# Patient Record
Sex: Male | Born: 1935 | Race: White | Hispanic: No | Smoking: Never smoker
Health system: Southern US, Community
[De-identification: ages and names within clinical notes are randomized; demographics above are authoritative.]

## PROBLEM LIST (undated history)

## (undated) DIAGNOSIS — I10 Essential (primary) hypertension: Secondary | ICD-10-CM

## (undated) DIAGNOSIS — E785 Hyperlipidemia, unspecified: Secondary | ICD-10-CM

## (undated) DIAGNOSIS — I441 Atrioventricular block, second degree: Secondary | ICD-10-CM

## (undated) DIAGNOSIS — R7303 Prediabetes: Secondary | ICD-10-CM

## (undated) DIAGNOSIS — I350 Nonrheumatic aortic (valve) stenosis: Secondary | ICD-10-CM

## (undated) HISTORY — PX: HERNIA REPAIR: SHX51

## (undated) HISTORY — PX: APPENDECTOMY: SHX54

## (undated) HISTORY — DX: Hyperlipidemia, unspecified: E78.5

## (undated) HISTORY — DX: Prediabetes: R73.03

## (undated) HISTORY — DX: Nonrheumatic aortic (valve) stenosis: I35.0

## (undated) HISTORY — PX: TONSILLECTOMY: SUR1361

## (undated) HISTORY — DX: Atrioventricular block, second degree: I44.1

---

## 2004-02-23 ENCOUNTER — Ambulatory Visit: Payer: Self-pay | Admitting: Ophthalmology

## 2004-02-28 ENCOUNTER — Ambulatory Visit: Payer: Self-pay | Admitting: Ophthalmology

## 2004-06-12 ENCOUNTER — Emergency Department: Payer: Self-pay | Admitting: General Practice

## 2004-06-25 ENCOUNTER — Emergency Department: Payer: Self-pay | Admitting: Emergency Medicine

## 2006-10-13 ENCOUNTER — Ambulatory Visit: Payer: Self-pay | Admitting: Internal Medicine

## 2006-11-04 ENCOUNTER — Other Ambulatory Visit: Payer: Self-pay

## 2006-11-04 ENCOUNTER — Ambulatory Visit: Payer: Self-pay | Admitting: General Surgery

## 2006-11-10 ENCOUNTER — Ambulatory Visit: Payer: Self-pay | Admitting: General Surgery

## 2007-05-18 ENCOUNTER — Ambulatory Visit: Payer: Self-pay | Admitting: Unknown Physician Specialty

## 2009-06-22 ENCOUNTER — Ambulatory Visit: Payer: Self-pay | Admitting: Family Medicine

## 2009-10-31 ENCOUNTER — Ambulatory Visit: Payer: Self-pay | Admitting: Ophthalmology

## 2009-11-06 ENCOUNTER — Ambulatory Visit: Payer: Self-pay | Admitting: Ophthalmology

## 2009-11-20 LAB — PATHOLOGY REPORT

## 2009-11-21 ENCOUNTER — Ambulatory Visit: Payer: Self-pay | Admitting: Internal Medicine

## 2009-11-22 ENCOUNTER — Ambulatory Visit: Payer: Self-pay | Admitting: Internal Medicine

## 2009-12-19 ENCOUNTER — Ambulatory Visit: Payer: Self-pay | Admitting: Internal Medicine

## 2010-01-18 ENCOUNTER — Ambulatory Visit: Payer: Self-pay | Admitting: Internal Medicine

## 2010-02-18 ENCOUNTER — Ambulatory Visit: Payer: Self-pay | Admitting: Internal Medicine

## 2010-06-15 ENCOUNTER — Ambulatory Visit: Payer: Self-pay | Admitting: Radiation Oncology

## 2010-06-19 ENCOUNTER — Ambulatory Visit: Payer: Self-pay | Admitting: Radiation Oncology

## 2011-06-14 ENCOUNTER — Ambulatory Visit: Payer: Self-pay | Admitting: Radiation Oncology

## 2011-06-19 ENCOUNTER — Ambulatory Visit: Payer: Self-pay | Admitting: Radiation Oncology

## 2011-10-22 ENCOUNTER — Ambulatory Visit: Payer: Self-pay | Admitting: Urology

## 2011-10-22 LAB — BASIC METABOLIC PANEL
Anion Gap: 5 — ABNORMAL LOW (ref 7–16)
BUN: 22 mg/dL — ABNORMAL HIGH (ref 7–18)
Calcium, Total: 8.6 mg/dL (ref 8.5–10.1)
Chloride: 105 mmol/L (ref 98–107)
Co2: 30 mmol/L (ref 21–32)
Creatinine: 1.3 mg/dL (ref 0.60–1.30)
EGFR (African American): >60
EGFR (Non-African Amer.): 53 — ABNORMAL LOW
Glucose: 127 mg/dL — ABNORMAL HIGH (ref 65–99)
Osmolality: 284 (ref 275–301)
Potassium: 3.5 mmol/L (ref 3.5–5.1)
Sodium: 140 mmol/L (ref 136–145)

## 2011-10-22 LAB — HEMOGLOBIN: HGB: 16.8 g/dL (ref 13.0–18.0)

## 2011-10-30 ENCOUNTER — Ambulatory Visit: Payer: Self-pay | Admitting: Family Medicine

## 2011-11-26 ENCOUNTER — Ambulatory Visit: Payer: Self-pay | Admitting: Urology

## 2011-11-26 LAB — POTASSIUM: Potassium: 3.4 mmol/L — ABNORMAL LOW (ref 3.5–5.1)

## 2011-12-03 ENCOUNTER — Ambulatory Visit: Payer: Self-pay | Admitting: Urology

## 2012-03-04 ENCOUNTER — Ambulatory Visit: Payer: Self-pay | Admitting: Ophthalmology

## 2012-03-04 LAB — POTASSIUM: Potassium: 3.5 mmol/L (ref 3.5–5.1)

## 2012-03-17 ENCOUNTER — Ambulatory Visit: Payer: Self-pay | Admitting: Ophthalmology

## 2012-06-18 ENCOUNTER — Ambulatory Visit: Payer: Self-pay | Admitting: Radiation Oncology

## 2012-07-19 ENCOUNTER — Ambulatory Visit: Payer: Self-pay | Admitting: Radiation Oncology

## 2012-08-17 ENCOUNTER — Ambulatory Visit: Payer: Self-pay | Admitting: Unknown Physician Specialty

## 2012-08-18 LAB — PATHOLOGY REPORT

## 2013-01-12 ENCOUNTER — Ambulatory Visit: Payer: Self-pay | Admitting: Ophthalmology

## 2013-06-18 ENCOUNTER — Ambulatory Visit: Payer: Self-pay | Admitting: Radiation Oncology

## 2014-02-24 ENCOUNTER — Ambulatory Visit: Payer: Self-pay | Admitting: Ophthalmology

## 2014-02-24 LAB — POTASSIUM: Potassium: 3.5 mmol/L (ref 3.5–5.1)

## 2014-03-01 ENCOUNTER — Ambulatory Visit: Payer: Self-pay | Admitting: Ophthalmology

## 2014-06-07 NOTE — Op Note (Signed)
PATIENT NAME:  Timothy Ramirez, Timothy Ramirez MR#:  [id number] DATE OF BIRTH:  12-Feb-1934  DATE OF PROCEDURE:  12/03/2011  PREOPERATIVE DIAGNOSIS: Left inguinal hernia with bladder.   POSTOPERATIVE DIAGNOSIS: Left inguinal hernia with bladder   PROCEDURE: Left inguinal herniorrhaphy.   SURGEON: Madolyn FriezeBrian S. Achilles Dunkope, MD   ANESTHESIA: Laryngeal mask airway anesthesia.   INDICATIONS: The patient is a 81 year old gentleman who initially presented with irritative voiding symptoms. He was also noted to have a left inguinal hernia at the time of cystoscopy. The bladder was noted to be pulled to the left with a significant portion of the bladder into the hernia defect. He presents for left inguinal herniorrhaphy.   PROCEDURE: After informed consent was obtained, the patient was taken to the operating room and placed in the supine position on the operating table under general endotracheal anesthesia. The patient was then prepped and draped in the usual standard fashion. An approximate 6 cm incision was made in the left inguinal crease. The incision was continued down to expose the fascia. A large hernia defect was noted extending through the external ring. The external fascia was opened in the standard fashion. The ilioinguinal nerve was identified. It was mobilized medially. The cord was identified at the inferior aspect of the large hernia defect. A plane was developed between the hernia and the cord contents. This was encircled. It was dissected free to the level of the hernia defect. A large portion of bladder was through a small defect approximately 1.5 to 2 cm in size. Due to thickening of the bladder, ongoing manipulation was necessary to reduce the bladder back into the preperitoneal compartment. This was successfully accomplished. A gauze was placed into the opening to keep the bladder in place while the remainder of the dissection was performed. The transverse layer was identified. A relaxing incision was made. The  shelving edge was also identified. Allis clamps were placed along the shelving edge. The ligament was identified and cleaned to the level of the pubis and lateral. 0 Surgilon sutures were then utilized starting at the most medial aspect of the shelving edge to the ligament at the level of the symphysis. Approximately 18 sutures were utilized to complete the closure. The fifth finger could be easily inserted into the remaining opening for the cord. Good closure was noted throughout. The cord was then returned to the canal. The ilioinguinal nerve was placed on the dorsal aspect of the cord contents. The ring and external fascia was closed utilizing a running 3-0 Vicryl suture. Care was taken to avoid injury to the nerve. The nerve was easily identified throughout the closure. The subcutaneous tissue was then closed utilizing a 0 plain gut suture. The skin was closed utilizing 4-0 Vicryl subcuticular stitch. Steri-Strips, Telfa, and Tegaderm dressing was applied. Approximately 10 mL of half-strength Marcaine was injected into the incision site. The patient was then awakened from general endotracheal anesthesia and was taken to the recovery room in stable condition. There were no problems or complications. The patient tolerated the procedure well. Estimated blood loss was minimal.   ____________________________ Madolyn FriezeBrian S. Achilles Dunkope, MD bsc:drc D: 12/03/2011 10:59:45 ET T: 12/03/2011 11:16:16 ET JOB#: [id number]  cc: Madolyn FriezeBrian S. Achilles Dunkope, MD, <Dictator> Madolyn FriezeBRIAN S Donnae Michels MD ELECTRONICALLY SIGNED 12/03/2011 21:25

## 2014-06-10 NOTE — Op Note (Signed)
PATIENT NAME:  Timothy CooperBOWMAN, Abhishek M MR#:  [id number] DATE OF BIRTH:  07-25-1933  DATE OF PROCEDURE:  03/17/2012  PREOPERATIVE DIAGNOSIS: Visually significant pterygium of the left eye.   POSTOPERATIVE DIAGNOSIS: Visually significant pterygium of the left eye.   OPERATIVE PROCEDURE: Pterygium excision with creation of a conjunctival autograft in the left eye.   SURGEON: Jerilee FieldWilliam L. Lianna Sitzmann, MD.   ANESTHESIA:   4% Xylocaine , 0.75% bupivicaine     COMPLICATIONS: None.   DESCRIPTION OF PROCEDURE: The patient was examined and consented for this procedure in the preoperative holding area. He was then brought back to the operating room where the anesthesia team deployed managed anesthesia care while 5.5 mL of the aforementioned mixture were placed into the left orbit without complication. The eye was then prepped and draped in the usual sterile ophthalmic fashion. A 5-0 Mersilene on a spatulated needle was placed in a partial-thickness fashion through the superior limbus to serve as a stay suture. The eye was rotated laterally.Sharp and blunt dissection with Westcott scissors was used to remove the ptrygium. A beaver blade was used scrape some of the scar tissue from Vignola's layer. The resulting bare sclera defect was measured to be 10 mm x 7   This autograft was harvested in a blunt and sharp dissection using the Westcott scissors. The graft was moved to the bare sclera maintaining its original orientation to the globe. The graft was secured into place using 8 interrupted 9-0 Vicryl sutures. The stay suture was removed. The eye was dressed with maxitrol oinment and protective shield. The patient is instructed to leave these in place until tomorrow morning, at which time he will remove them and begin topical drops. He does have oxycodone at home, which he may take as he has previously for pain should his eye be uncomfortable this evening.     ____________________________ Jerilee FieldWilliam L. Ruhan Borak,  MD wlp:cc D: 03/17/2012 15:57:16 ET T: 03/17/2012 17:08:10 ET JOB#: [id number]  cc: Mikey Maffett L. Amiayah Giebel, MD, <Dictator> Jerilee FieldWILLIAM L Vansh Reckart MD ELECTRONICALLY SIGNED 03/26/2012 14:32

## 2014-06-19 NOTE — Op Note (Signed)
PATIENT NAME:  Timothy Ramirez, Timothy Ramirez MR#:  [id number] DATE OF BIRTH:  07/30/1933  DATE OF PROCEDURE:  03/01/2014  PREOPERATIVE DIAGNOSIS:  Nuclear sclerotic cataract of the left eye.   POSTOPERATIVE DIAGNOSIS:  Nuclear sclerotic cataract of the left eye.   OPERATIVE PROCEDURE:  Cataract extraction by phacoemulsification with implant of intraocular lens to left eye.   SURGEON:  Jerilee FieldWilliam L. Chaunce Winkels, MD   ANESTHESIA:  1. Managed anesthesia care.  2. Topical tetracaine drops followed by 2% Xylocaine jelly applied in the preoperative holding area.   COMPLICATIONS:  None.   TECHNIQUE:  Stop and chop.  DESCRIPTION OF PROCEDURE:  The patient was examined and consented in the preoperative holding area where the aforementioned topical anesthesia was applied to the left eye and then brought back to the Operating Room where the left eye was prepped and draped in the usual sterile ophthalmic fashion and a lid speculum was placed. A paracentesis was created with the side port blade and the anterior chamber was filled with viscoelastic. A near clear corneal incision was performed with the steel keratome. A continuous curvilinear capsulorrhexis was performed with a cystotome followed by the capsulorrhexis forceps. Hydrodissection and hydrodelineation were carried out with BSS on a blunt cannula. The lens was removed in a stop and chop technique and the remaining cortical material was removed with the irrigation-aspiration handpiece. The capsular bag was inflated with viscoelastic and the Tecnis ZCB00, 22.0-diopter lens, serial number [id number], was placed in the capsular bag without complication. The remaining viscoelastic was removed from the eye with the irrigation-aspiration handpiece. The wounds were hydrated. The anterior chamber was flushed with Miostat and the eye was inflated to physiologic pressure. 0.1 mL of cefuroxime concentration 10 mg/mL was placed in the anterior chamber. The wounds were found to be  water tight. The eye was dressed with Vigamox. The patient was given protective glasses to wear throughout the day and a shield with which to sleep tonight. The patient was also given drops with which to begin a drop regimen today and will follow up with me in one day.    ____________________________ Jerilee FieldWilliam L. Melody Savidge, MD wlp:nb D: 03/01/2014 21:06:09 ET T: 03/02/2014 04:00:02 ET JOB#: [id number]  cc: Dorene Bruni L. Joniyah Mallinger, MD, <Dictator> Jerilee FieldWILLIAM L Aviella Disbrow MD ELECTRONICALLY SIGNED 03/02/2014 11:19

## 2014-09-05 ENCOUNTER — Emergency Department: Payer: Medicare Other

## 2014-09-05 ENCOUNTER — Emergency Department
Admission: EM | Admit: 2014-09-05 | Discharge: 2014-09-05 | Disposition: A | Discharge status: Home / Self Care | Payer: Medicare Other | Attending: Emergency Medicine | Admitting: Emergency Medicine

## 2014-09-05 DIAGNOSIS — R109 Unspecified abdominal pain: Secondary | ICD-10-CM | POA: Diagnosis not present

## 2014-09-05 DIAGNOSIS — I10 Essential (primary) hypertension: Secondary | ICD-10-CM | POA: Diagnosis not present

## 2014-09-05 HISTORY — DX: Essential (primary) hypertension: I10

## 2014-09-05 LAB — COMPREHENSIVE METABOLIC PANEL
ALT: 20 U/L (ref 17–63)
AST: 22 U/L (ref 15–41)
Albumin: 3.7 g/dL (ref 3.5–5.0)
Alkaline Phosphatase: 90 U/L (ref 38–126)
Anion gap: 8 (ref 5–15)
BUN: 20 mg/dL (ref 6–20)
CO2: 28 mmol/L (ref 22–32)
Calcium: 8.8 mg/dL — ABNORMAL LOW (ref 8.9–10.3)
Chloride: 100 mmol/L — ABNORMAL LOW (ref 101–111)
Creatinine, Ser: 1.14 mg/dL (ref 0.61–1.24)
GFR calc Af Amer: >60 mL/min (ref >60)
GFR calc non Af Amer: 60 mL/min — ABNORMAL LOW (ref >60)
Glucose, Bld: 122 mg/dL — ABNORMAL HIGH (ref 65–99)
Potassium: 2.9 mmol/L — ABNORMAL LOW (ref 3.5–5.1)
Sodium: 136 mmol/L (ref 135–145)
Total Bilirubin: 1.8 mg/dL — ABNORMAL HIGH (ref 0.3–1.2)
Total Protein: 6.7 g/dL (ref 6.5–8.1)

## 2014-09-05 LAB — CBC WITH DIFFERENTIAL/PLATELET
Basophils Absolute: 0 10*3/uL (ref 0–0.1)
Basophils Relative: 1 %
Eosinophils Absolute: 0.1 10*3/uL (ref 0–0.7)
Eosinophils Relative: 2 %
HCT: 46.2 % (ref 40.0–52.0)
Hemoglobin: 15.9 g/dL (ref 13.0–18.0)
Lymphocytes Relative: 36 %
Lymphs Abs: 1.8 10*3/uL (ref 1.0–3.6)
MCH: 30.8 pg (ref 26.0–34.0)
MCHC: 34.5 g/dL (ref 32.0–36.0)
MCV: 89.3 fL (ref 80.0–100.0)
Monocytes Absolute: 0.6 10*3/uL (ref 0.2–1.0)
Monocytes Relative: 11 %
Neutro Abs: 2.5 10*3/uL (ref 1.4–6.5)
Neutrophils Relative %: 50 %
Platelets: 146 10*3/uL — ABNORMAL LOW (ref 150–440)
RBC: 5.17 MIL/uL (ref 4.40–5.90)
RDW: 13.5 % (ref 11.5–14.5)
WBC: 5.1 10*3/uL (ref 3.8–10.6)

## 2014-09-05 LAB — URINALYSIS COMPLETE WITH MICROSCOPIC (ARMC ONLY)
Bacteria, UA: NONE SEEN
Bilirubin Urine: NEGATIVE
Glucose, UA: NEGATIVE mg/dL
Ketones, ur: NEGATIVE mg/dL
Leukocytes, UA: NEGATIVE
Nitrite: NEGATIVE
Protein, ur: NEGATIVE mg/dL
Specific Gravity, Urine: 1.02 (ref 1.005–1.030)
pH: 6 (ref 5.0–8.0)

## 2014-09-05 LAB — TROPONIN I: Troponin I: <0.03 ng/mL (ref <0.031)

## 2014-09-05 MED ORDER — POTASSIUM CHLORIDE CRYS ER 20 MEQ PO TBCR
40.0000 meq | EXTENDED_RELEASE_TABLET | Freq: Once | ORAL | Status: AC
  Administered 2014-09-05: 40 meq via ORAL
  Filled 2014-09-05: qty 2

## 2014-09-05 MED ORDER — OXYCODONE-ACETAMINOPHEN 5-325 MG PO TABS: 1.0000 | ORAL_TABLET | Freq: Four times a day (QID) | ORAL | Status: DC | PRN

## 2014-09-05 MED ORDER — OXYCODONE-ACETAMINOPHEN 5-325 MG PO TABS
2.0000 | ORAL_TABLET | Freq: Once | ORAL | Status: AC
  Administered 2014-09-05: 2 via ORAL
  Filled 2014-09-05: qty 2

## 2014-09-05 MED ORDER — KETOROLAC TROMETHAMINE 30 MG/ML IJ SOLN
15.0000 mg | Freq: Once | INTRAMUSCULAR | Status: AC
  Administered 2014-09-05: 15 mg via INTRAVENOUS
  Filled 2014-09-05: qty 1

## 2014-09-05 MED ORDER — MORPHINE SULFATE 4 MG/ML IJ SOLN
4.0000 mg | Freq: Once | INTRAMUSCULAR | Status: AC
  Administered 2014-09-05: 4 mg via INTRAVENOUS
  Filled 2014-09-05: qty 1

## 2014-09-05 MED ORDER — POLYETHYLENE GLYCOL 3350 17 G PO PACK: 17.0000 g | PACK | Freq: Every day | ORAL | Status: DC

## 2014-09-05 NOTE — ED Notes (Signed)
MD at bedside. 

## 2014-09-05 NOTE — ED Provider Notes (Signed)
[facility] Emergency Department Provider Note     Time seen: ----------------------------------------- 8:42 AM on 09/05/2014 -----------------------------------------    I have reviewed the triage vital signs and the nursing notes.   HISTORY  Chief Complaint Flank Pain    HPI Timothy Ramirez is a 81 y.o. male who presents ER after left flank pain for last 3-4 days. Describes as a sharp pain, nothing seems to make it better or worse. Patient has not had a history of this before, denies history of renal colic. Denies fevers, chills, nausea, vomiting, or diarrhea.   Past Medical History  Diagnosis Date  . Hypertension     There are no active problems to display for this patient.   Past Surgical History  Procedure Laterality Date  . Tonsillectomy    . Appendectomy      Allergies Review of patient's allergies indicates no known allergies.  Social History History  Substance Use Topics  . Smoking status: Never Smoker   . Smokeless tobacco: Never Used  . Alcohol Use: Yes    Review of Systems Constitutional: Negative for fever. Eyes: Negative for visual changes. ENT: Negative for sore throat. Cardiovascular: Negative for chest pain. Respiratory: Negative for shortness of breath. Gastrointestinal: Positive for left flank pain Genitourinary: Negative for dysuria. Musculoskeletal: Negative for back pain. Skin: Negative for rash. Neurological: Negative for headaches, focal weakness or numbness.  10-point ROS otherwise negative.  ____________________________________________   PHYSICAL EXAM:  VITAL SIGNS: ED Triage Vitals  Enc Vitals Group     BP 09/05/14 0837 158/82 mmHg     Pulse Rate 09/05/14 0837 64     Resp 09/05/14 0837 17     Temp 09/05/14 0837 97.5 F (36.4 C)     Temp Source 09/05/14 0837 Oral     SpO2 09/05/14 0837 99 %     Weight 09/05/14 0837 200 lb (90.719 kg)     Height 09/05/14 0837  (1.702 m)     Head Cir  --      Peak Flow --      Pain Score 09/05/14 0838 7     Pain Loc --      Pain Edu? --      Excl. in GC? --     Constitutional: Alert and oriented. Well appearing and in no distress. Eyes: Conjunctivae are normal. PERRL. Normal extraocular movements. ENT   Head: Normocephalic and atraumatic.   Nose: No congestion/rhinnorhea.   Mouth/Throat: Mucous membranes are moist.   Neck: No stridor. Hematological/Lymphatic/Immunilogical: No cervical lymphadenopathy. Cardiovascular: Normal rate, regular rhythm. Normal and symmetric distal pulses are present in all extremities. No murmurs, rubs, or gallops. Respiratory: Normal respiratory effort without tachypnea nor retractions. Breath sounds are clear and equal bilaterally. No wheezes/rales/rhonchi. Gastrointestinal: Soft No distention. No abdominal bruits. There is no CVA tenderness. Mild left flank tenderness. Musculoskeletal: Nontender with normal range of motion in all extremities. No joint effusions.  No lower extremity tenderness nor edema. Neurologic:  Normal speech and language. No gross focal neurologic deficits are appreciated. Speech is normal. No gait instability. Skin:  Skin is warm, dry and intact. No rash noted. Psychiatric: Mood and affect are normal. Speech and behavior are normal. Patient exhibits appropriate insight and judgment. ____________________________________________  ED COURSE:  Pertinent labs & imaging results that were available during my care of the patient were reviewed by me and considered in my medical decision making (see chart for details). Unclear etiology of left flank pain, could be multifactorial.  We'll need labs and imaging to determine ____________________________________________    LABS (pertinent positives/negatives)  Labs Reviewed  URINALYSIS COMPLETEWITH MICROSCOPIC ([facility] ONLY) - Abnormal; Notable for the following:    Color, Urine YELLOW (*)    APPearance CLEAR (*)    Hgb urine  dipstick 1+ (*)    Squamous Epithelial / LPF 0-5 (*)    All other components within normal limits  CBC WITH DIFFERENTIAL/PLATELET - Abnormal; Notable for the following:    Platelets 146 (*)    All other components within normal limits  COMPREHENSIVE METABOLIC PANEL - Abnormal; Notable for the following:    Potassium 2.9 (*)    Chloride 100 (*)    Glucose, Bld 122 (*)    Calcium 8.8 (*)    Total Bilirubin 1.8 (*)    GFR calc non Af Amer 60 (*)    All other components within normal limits  TROPONIN I    RADIOLOGY Images were viewed by me  Acute abdominal series IMPRESSION: Fairly diffuse stool throughout colon. Bowel gas pattern unremarkable. No lung edema or consolidation.  ____________________________________________  FINAL ASSESSMENT AND PLAN  Left flank pain  Plan: No clear etiology for his symptoms. We discharged with pain medication and muscle relaxants. He is advised to have close follow-up with his doctor if not improving. He was given a dose of oral potassium to replete his potassium level II.9.   Emily FilbertWilliams, Jonathan E, MD   Emily FilbertJonathan E Williams, MD 09/05/14 [phone]

## 2014-09-05 NOTE — Discharge Instructions (Signed)
Flank Pain °Flank pain refers to pain that is located on the side of the body between the upper abdomen and the back. The pain may occur over a short period of time (acute) or may be long-term or reoccurring (chronic). It may be mild or severe. Flank pain can be caused by many things. °CAUSES  °Some of the more common causes of flank pain include: °· Muscle strains.   °· Muscle spasms.   °· A disease of your spine (vertebral disk disease).   °· A lung infection (pneumonia).   °· Fluid around your lungs (pulmonary edema).   °· A kidney infection.   °· Kidney stones.   °· A very painful skin rash caused by the chickenpox virus (shingles).   °· Gallbladder disease.   °HOME CARE INSTRUCTIONS  °Home care will depend on the cause of your pain. In general, °· Rest as directed by your caregiver. °· Drink enough fluids to keep your urine clear or pale yellow. °· Only take over-the-counter or prescription medicines as directed by your caregiver. Some medicines may help relieve the pain. °· Tell your caregiver about any changes in your pain. °· Follow up with your caregiver as directed. °SEEK IMMEDIATE MEDICAL CARE IF:  °· Your pain is not controlled with medicine.   °· You have new or worsening symptoms. °· Your pain increases.   °· You have abdominal pain.   °· You have shortness of breath.   °· You have persistent nausea or vomiting.   °· You have swelling in your abdomen.   °· You feel faint or pass out.   °· You have blood in your urine. °· You have a fever or persistent symptoms for more than 2-3 days. °· You have a fever and your symptoms suddenly get worse. °MAKE SURE YOU:  °· Understand these instructions. °· Will watch your condition. °· Will get help right away if you are not doing well or get worse. °Document Released: 03/28/2005 Document Revised: 10/30/2011 Document Reviewed: 09/19/2011 °[employer]® Patient Information ©2015 [employer]. This information is not intended to replace advice given to you by your  health care provider. Make sure you discuss any questions you have with your health care provider. ° °

## 2014-09-05 NOTE — ED Notes (Signed)
Left flank pain since friday

## 2014-09-05 NOTE — ED Notes (Signed)
Pt c/o left flank pain for the past 3-4 days.Marland Kitchen.denies N/V or other urinary sx.

## 2018-03-23 ENCOUNTER — Emergency Department: Payer: Medicare Other

## 2018-03-23 ENCOUNTER — Encounter: Payer: Self-pay | Admitting: Emergency Medicine

## 2018-03-23 ENCOUNTER — Emergency Department
Admission: EM | Admit: 2018-03-23 | Discharge: 2018-03-23 | Disposition: A | Discharge status: Home / Self Care | Payer: Medicare Other | Attending: Emergency Medicine | Admitting: Emergency Medicine

## 2018-03-23 ENCOUNTER — Other Ambulatory Visit: Payer: Self-pay

## 2018-03-23 DIAGNOSIS — M79602 Pain in left arm: Secondary | ICD-10-CM | POA: Insufficient documentation

## 2018-03-23 DIAGNOSIS — Z79899 Other long term (current) drug therapy: Secondary | ICD-10-CM | POA: Insufficient documentation

## 2018-03-23 DIAGNOSIS — M79603 Pain in arm, unspecified: Secondary | ICD-10-CM

## 2018-03-23 DIAGNOSIS — I10 Essential (primary) hypertension: Secondary | ICD-10-CM | POA: Diagnosis not present

## 2018-03-23 LAB — COMPREHENSIVE METABOLIC PANEL
ALT: 14 U/L (ref 0–44)
AST: 17 U/L (ref 15–41)
Albumin: 3.1 g/dL — ABNORMAL LOW (ref 3.5–5.0)
Alkaline Phosphatase: 79 U/L (ref 38–126)
Anion gap: 5 (ref 5–15)
BUN: 21 mg/dL (ref 8–23)
CO2: 26 mmol/L (ref 22–32)
Calcium: 7.3 mg/dL — ABNORMAL LOW (ref 8.9–10.3)
Chloride: 107 mmol/L (ref 98–111)
Creatinine, Ser: 1.21 mg/dL (ref 0.61–1.24)
GFR calc Af Amer: >60 mL/min (ref >60)
GFR calc non Af Amer: 55 mL/min — ABNORMAL LOW (ref >60)
Glucose, Bld: 157 mg/dL — ABNORMAL HIGH (ref 70–99)
Potassium: 3 mmol/L — ABNORMAL LOW (ref 3.5–5.1)
Sodium: 138 mmol/L (ref 135–145)
Total Bilirubin: 1.5 mg/dL — ABNORMAL HIGH (ref 0.3–1.2)
Total Protein: 5.3 g/dL — ABNORMAL LOW (ref 6.5–8.1)

## 2018-03-23 LAB — CBC WITH DIFFERENTIAL/PLATELET
Abs Immature Granulocytes: 0.02 10*3/uL (ref 0.00–0.07)
Basophils Absolute: 0 10*3/uL (ref 0.0–0.1)
Basophils Relative: 0 %
Eosinophils Absolute: 0.1 10*3/uL (ref 0.0–0.5)
Eosinophils Relative: 1 %
HCT: 45.7 % (ref 39.0–52.0)
Hemoglobin: 15.2 g/dL (ref 13.0–17.0)
Immature Granulocytes: 0 %
Lymphocytes Relative: 26 %
Lymphs Abs: 1.9 10*3/uL (ref 0.7–4.0)
MCH: 30.2 pg (ref 26.0–34.0)
MCHC: 33.3 g/dL (ref 30.0–36.0)
MCV: 90.7 fL (ref 80.0–100.0)
Monocytes Absolute: 0.7 10*3/uL (ref 0.1–1.0)
Monocytes Relative: 10 %
Neutro Abs: 4.5 10*3/uL (ref 1.7–7.7)
Neutrophils Relative %: 63 %
Platelets: 121 10*3/uL — ABNORMAL LOW (ref 150–400)
RBC: 5.04 MIL/uL (ref 4.22–5.81)
RDW: 13.6 % (ref 11.5–15.5)
WBC: 7.3 10*3/uL (ref 4.0–10.5)
nRBC: 0 % (ref 0.0–0.2)

## 2018-03-23 LAB — TROPONIN I
Troponin I: <0.03 ng/mL (ref <0.03)
Troponin I: <0.03 ng/mL (ref <0.03)

## 2018-03-23 NOTE — ED Notes (Signed)
MD at bedside at this time to update pt.

## 2018-03-23 NOTE — ED Triage Notes (Signed)
Pt ems from home for left arm, left shoulder blade pain that started approx. 1600 today. Pain described as a "catch" and now is a burning, has been constant since it started, has decreased some last few minutes. Pt took 325 mg aspirin at home and EMS gave .4 SL nitro x 2 with some relief. Pt denies heart history.

## 2018-03-23 NOTE — ED Provider Notes (Addendum)
[facility] Provider Note  ____________________________________________   I have reviewed the triage vital signs and the nursing notes. Where available I have reviewed prior notes and, if possible and indicated, outside hospital notes.    HISTORY  Chief Complaint Chest Pain    HPI Timothy Ramirez is a 85 y.o. male  Presents today complaining of left arm pain.  States it is kind of a burning discomfort.  He has no chest pain, it, goes towards his shoulder blade a little bit.  He states this is the fourth or fifth time it has happened.  It always happens at rest.  Has had no exertional symptoms, this happens at rest usually.  He was sitting there doing nothing he states when it started.  He has no nausea no vomiting, he denies any orthopnea or leg swelling, he states the pain is nearly gone.  It always goes away on its own.  Patient does have a history of hypertension.  Not had a recent scan of his heart since a myocardial scan under nuclear medicine in 2013 which was normal patient does have a history of hip bursitis which is not acting up at this time.  He does not feel that he injured his arm although he does have an abrasion to the left elbow that he thinks he bumped it on at some point.  Looks fairly fresh.     Past Medical History:  Diagnosis Date  . Hypertension     There are no active problems to display for this patient.   Past Surgical History:  Procedure Laterality Date  . APPENDECTOMY    . HERNIA REPAIR    . TONSILLECTOMY      Prior to Admission medications   Medication Sig Start Date End Date Taking? Authorizing Provider  atorvastatin (LIPITOR) 20 MG tablet Take 20 mg by mouth every morning.    [provider]  citalopram (CELEXA) 20 MG tablet Take 20 mg by mouth daily.    [provider]  hydrochlorothiazide (HYDRODIURIL) 25 MG tablet Take 25 mg by mouth daily.    [provider]   oxyCODONE-acetaminophen (ROXICET) 5-325 MG per tablet Take 1 tablet by mouth every 6 (six) hours as needed. 09/05/14   Emily Filbert, MD  polyethylene glycol (MIRALAX / Ethelene Hal) packet Take 17 g by mouth daily. 09/05/14   Emily Filbert, MD  potassium chloride (KLOR-CON 10) 10 MEQ tablet Take 10 mEq by mouth daily.    [provider]  zolpidem (AMBIEN) 10 MG tablet Take 10 mg by mouth at bedtime.    [provider]    Allergies Patient has no known allergies.  No family history on file.  Social History Social History   Tobacco Use  . Smoking status: Never Smoker  . Smokeless tobacco: Never Used  Substance Use Topics  . Alcohol use: Yes  . Drug use: No    Review of Systems Constitutional: No fever/chills Eyes: No visual changes. ENT: No sore throat. No stiff neck no neck pain Cardiovascular: Denies chest pain. Respiratory: Denies shortness of breath. Gastrointestinal:   no vomiting.  No diarrhea.  No constipation. Genitourinary: Negative for dysuria. Musculoskeletal: Negative lower extremity swelling Skin: Negative for rash. Neurological: Negative for severe headaches, focal weakness or numbness.   ____________________________________________   PHYSICAL EXAM:  VITAL SIGNS: ED Triage Vitals  Enc Vitals Group     BP 03/23/18 1830 133/74     Pulse Rate 03/23/18 1830 (!)  102     Resp --      Temp 03/23/18 1830 97.8 F (36.6 C)     Temp Source 03/23/18 1830 Oral     SpO2 03/23/18 1830 99 %     Weight 03/23/18 1833 197 lb (89.4 kg)     Height 03/23/18 1833 5\' 10"  (1.778 m)     Head Circumference --      Peak Flow --      Pain Score 03/23/18 1832 2     Pain Loc --      Pain Edu? --      Excl. in GC? --     Constitutional: Alert and oriented. Well appearing and in no acute distress. Eyes: Conjunctivae are normal Head: Atraumatic HEENT: No congestion/rhinnorhea. Mucous membranes are moist.  Oropharynx non-erythematous Neck:    Nontender with no meningismus, no masses, no stridor Cardiovascular: Normal rate, regular rhythm. Grossly normal heart sounds.  Good peripheral circulation. Respiratory: Normal respiratory effort.  No retractions. Lungs CTAB. Abdominal: Soft and nontender. No distention. No guarding no rebound Back:  There is no focal tenderness or step off.  there is no midline tenderness there are no lesions noted. there is no CVA tenderness  Musculoskeletal: No lower extremity tenderness, no upper extremity tenderness. No joint effusions, no DVT signs strong distal pulses no edema Neurologic:  Normal speech and language. No gross focal neurologic deficits are appreciated.  Skin:  Skin is warm, dry and intact.  Slight abrasion of the left elbow with no bony tenderness. Psychiatric: Mood and affect are normal. Speech and behavior are normal.  ____________________________________________   LABS (all labs ordered are listed, but only abnormal results are displayed)  Labs Reviewed  CBC WITH DIFFERENTIAL/PLATELET  COMPREHENSIVE METABOLIC PANEL  TROPONIN I  CBG MONITORING, ED    Pertinent labs  results that were available during my care of the patient were reviewed by me and considered in my medical decision making (see chart for details). ____________________________________________  EKG  I personally interpreted any EKGs ordered by me or triage Sinus rate 105 no acute ST elevation or depression mild tachycardia noted, LAD noted, right bundle branch block appreciated.  ST changes likely consistent with that.  There is no significant change noted from 2016   ____________________________________________  RADIOLOGY  Pertinent labs & imaging results that were available during my care of the patient were reviewed by me and considered in my medical decision making (see chart for details). If possible, patient and/or family made aware of any abnormal findings.  No results  found. ____________________________________________    PROCEDURES  Procedure(s) performed: None  Procedures  Critical Care performed: None  ____________________________________________   INITIAL IMPRESSION / ASSESSMENT AND PLAN / ED COURSE  Pertinent labs & imaging results that were available during my care of the patient were reviewed by me and considered in my medical decision making (see chart for details).  Patient here with left shoulder discomfort which is gone at this time he states.  Certainly a possibility that could be ACS so we will send cardiac enzymes.  Patient has some evidence of injury to that arm, and is unclear what happened he does not remember how he got the abrasion.  Certainly could be related.  There is some mild degree of reproducibility to this discomfort.  Low suspicion for dissection, patient has had this for 5 times in the past, usually people do not have recurrent dissections.  Low suspicion for PE, he is not short of breath  and has no pleuritic component to this.  Low suspicion for myocarditis or endocarditis pneumothorax etc. but we will send cardiac enzymes x2 and observe him closely in the emergency room.  If everything remains negative for this very atypical story is my hope that we can get him safely home for outpatient follow-up.  ----------------------------------------- 9:08 PM on 03/23/2018 -----------------------------------------  Evette Cristal insisting on discharge I have prevailed upon him however to wait until I can get a second troponin which she is agreeable to he has no signs or symptoms of ongoing disease and he is declining any offer of admission at this time.  He understands risk-benefit alternative of discharge.  Family are with him and they also understand.  He does consent however to stay for second troponin.  Blood work is unremarkable at this time we will see about try to get him into see a cardiologist  tomorrow.  ----------------------------------------- 11:03 PM on 03/23/2018 -----------------------------------------  Patient eager to go, dressed and ready however the family would prefer not to see Dr. Park Breed, therefore, I called Dr. Graciela Husbands for them at their request and they will see them tomorrow he states.  Agrees with management and discharge.  Patient again declines admission return precautions follow-up given and understood.    ____________________________________________   FINAL CLINICAL IMPRESSION(S) / ED DIAGNOSES  Final diagnoses:  Arm pain      This chart was dictated using voice recognition software.  Despite best efforts to proofread,  errors can occur which can change meaning.      Jeanmarie Plant, MD 03/23/18 Nolen Mu    Jeanmarie Plant, MD 03/23/18 2109    Jeanmarie Plant, MD 03/23/18 [phone]

## 2018-03-23 NOTE — Discharge Instructions (Addendum)
Return to the emergency room for any new or worrisome symptoms moving chest pain or shortness of breath.  We have talked to Dr. Graciela Husbands, he will try to arrange outpatient follow-up with you tomorrow.  If you feel worse in any way or change your mind about admission please return to the ER.

## 2018-03-23 NOTE — ED Notes (Signed)
Pt verbalized understanding of d/c instructions and f/u care, no further questions at this time, pt ambulatory to the exit with steady gait.

## 2018-03-30 NOTE — Progress Notes (Signed)
New Outpatient Visit Date: 04/01/2018  Referring Provider: Ileana Roup, MD [facility] Emergency Department  Chief Complaint: Left arm and chest pain  HPI:  Timothy Ramirez is a 85 y.o. male who is being seen today for the evaluation of left arm pain at the request of Dr. Alphonzo Lemmings. He has a history of mild aortic stenosis, hypertension, and hyperlipidemia.  He presented to the [facility] emergency department last week with burning pain in the left arm radiating to the shoulder blade.  He denied chest pain and shortness of breath.  Today, Timothy Ramirez reports that for many years, he has experienced rare episodes of left arm pain.  This would typically occur about twice a year.  He would experience a sharp pain in the left upper arm, which would come on randomly and typically resolve after about 5 minutes.  However, about a week ago, he had recurrence of this pain with radiation to the left chest.  The pain lasted about an hour, prompting him to take aspirin and call 911.  Due to continued pain he was given sublingual nitroglycerin x2 by EMS with resolution of the pain before reaching the hospital.  There were no accompanying symptoms including shortness of breath, palpitations, nausea, diaphoresis, and lightheadedness.  He has not had any further recurrence of the pain.  Timothy Ramirez denies a history of prior cardiac disease.  He has undergone stress test in the past, most recently in 2013.  He does not recall why the study was ordered, though it was normal.  Echocardiogram in 2018 was notable for mild aortic stenosis.  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  He does not exercise regularly but does not have limitations with his usual activities.  However, his family has noted some shortness of breath with exertion.  --------------------------------------------------------------------------------------------------  Cardiovascular History &  Procedures: Cardiovascular Problems:  Left arm pain  Risk Factors:  Hypertension, hyperlipidemia, borderline diabetes mellitus, male gender, and age greater than 46  Cath/PCI:  None  CV Surgery:  None  EP Procedures and Devices:  None.  Non-Invasive Evaluation(s):  TTE (09/16/2016 [facility]): Mild LVH with normal LVEF (55 to 60%).  Normal RV size and function.  Moderate mitral regurgitation.  Trace aortic regurgitation.  Mild aortic stenosis.  Exercise MPI (10/30/2011): Negative exercise tolerance test.  No evidence of ischemia or scar by myocardial perfusion.  Normal LVEF (73%).  Recent CV Pertinent Labs: Lab Results  Component Value Date   K 3.0 (L) 03/23/2018   K 3.5 02/24/2014   BUN 21 03/23/2018   BUN 22 (H) 10/22/2011   CREATININE 1.21 03/23/2018   CREATININE 1.30 10/22/2011    --------------------------------------------------------------------------------------------------  Past Medical History:  Diagnosis Date  . Hypertension     Past Surgical History:  Procedure Laterality Date  . APPENDECTOMY    . HERNIA REPAIR    . TONSILLECTOMY      Current Meds  Medication Sig  . atorvastatin (LIPITOR) 20 MG tablet Take 20 mg by mouth every morning.  . hydrochlorothiazide (HYDRODIURIL) 25 MG tablet Take 25 mg by mouth daily.  . polyethylene glycol (MIRALAX / GLYCOLAX) packet Take 17 g by mouth daily.  . potassium chloride (KLOR-CON 10) 10 MEQ tablet Take 10 mEq by mouth daily.  . temazepam (RESTORIL) 15 MG capsule Take 15 mg by mouth at bedtime as needed for sleep.    Allergies: Patient has no known allergies.  Social History   Tobacco Use  .  Smoking status: Never Smoker  . Smokeless tobacco: Never Used  Substance Use Topics  . Alcohol use: Yes    Alcohol/week: 2.0 standard drinks    Types: 1 Cans of beer, 1 Shots of liquor per week  . Drug use: No    Family History  Problem Relation Age of Onset  . Congestive Heart Failure Mother   .  Cirrhosis Father   . COPD Sister     Review of Systems: A 12-system review of systems was performed and was negative except as noted in the HPI.  --------------------------------------------------------------------------------------------------  Physical Exam: BP 128/82 (BP Location: Right Arm, Patient Position: Sitting, Cuff Size: Normal)   Pulse 82   Ht 5\' 10"  (1.778 m)   Wt 199 lb (90.3 kg)   BMI 28.55 kg/m   General: NAD.  Accompanied by his daughter. HEENT: No conjunctival pallor or scleral icterus. Moist mucous membranes. OP clear. Neck: Supple without lymphadenopathy, thyromegaly, JVD, or HJR. No carotid bruit. Lungs: Normal work of breathing. Clear to auscultation bilaterally without wheezes or crackles. Heart: Regular rate and rhythm with 2/6 crescendo-decrescendo systolic murmur loudest at the right upper sternal border.  No rubs or gallops. Non-displaced PMI. Abd: Bowel sounds present. Soft, NT/ND without hepatosplenomegaly Ext: No lower extremity edema. Radial, PT, and DP pulses are 2+ bilaterally Skin: Warm and dry without rash. Neuro: CNIII-XII intact. Strength and fine-touch sensation intact in upper and lower extremities bilaterally. Psych: Normal mood and affect.  EKG: Normal sinus rhythm with left axis deviation and right bundle branch block.  Lab Results  Component Value Date   WBC 7.3 03/23/2018   HGB 15.2 03/23/2018   HCT 45.7 03/23/2018   MCV 90.7 03/23/2018   PLT 121 (L) 03/23/2018    Lab Results  Component Value Date   NA 138 03/23/2018   K 3.0 (L) 03/23/2018   CL 107 03/23/2018   CO2 26 03/23/2018   BUN 21 03/23/2018   CREATININE 1.21 03/23/2018   GLUCOSE 157 (H) 03/23/2018   ALT 14 03/23/2018    No results found for: CHOL, HDL, LDLCALC, LDLDIRECT, TRIG, CHOLHDL   --------------------------------------------------------------------------------------------------  ASSESSMENT AND PLAN: Chest and left arm pain Symptoms are atypical, given  that they began at rest and then Mr. Garbutt does not have any exertional pain.  However, response to nitroglycerin certainly raises the possibility for coronary insufficiency.  Additionally, Mr. Sanandres has multiple cardiac risk factors, including hypertension, hyperlipidemia, borderline diabetes, male gender, and age.  This is confounded by mild aortic stenosis noted on prior echocardiogram.  We have agreed to begin with an echocardiogram to assess for significant structural abnormalities.  If LVEF is normal and aortic stenosis does not appear severe, we will proceed with an exercise myocardial perfusion stress test.  Alternatively, we would need to consider cardiac catheterization.  In the meantime, I have recommended that Mr. Lafon begin taking aspirin 81 mg daily.  We will not make any other medication changes at this time.  Hypertension Blood pressure is upper normal today.  He should continue his current medications.  Hyperlipidemia Most recent LDL in 11/2017 was 75.  Continue current dose of atorvastatin; further adjustments based on ongoing cardiac work-up.  Follow-up: Return to clinic in 4 to 6 weeks.  Yvonne Kendall, MD 04/01/2018 10:50 AM

## 2018-04-01 ENCOUNTER — Ambulatory Visit (INDEPENDENT_AMBULATORY_CARE_PROVIDER_SITE_OTHER): Payer: Medicare Other | Admitting: Internal Medicine

## 2018-04-01 ENCOUNTER — Encounter

## 2018-04-01 ENCOUNTER — Encounter: Payer: Self-pay | Admitting: Internal Medicine

## 2018-04-01 VITALS — BP 128/82 | HR 82 | Ht 70.0 in | Wt 199.0 lb

## 2018-04-01 DIAGNOSIS — R0789 Other chest pain: Secondary | ICD-10-CM

## 2018-04-01 DIAGNOSIS — I10 Essential (primary) hypertension: Secondary | ICD-10-CM | POA: Diagnosis not present

## 2018-04-01 DIAGNOSIS — R0609 Other forms of dyspnea: Secondary | ICD-10-CM

## 2018-04-01 DIAGNOSIS — I350 Nonrheumatic aortic (valve) stenosis: Secondary | ICD-10-CM

## 2018-04-01 DIAGNOSIS — R0600 Dyspnea, unspecified: Secondary | ICD-10-CM

## 2018-04-01 MED ORDER — ASPIRIN EC 81 MG PO TBEC: 81.0000 mg | DELAYED_RELEASE_TABLET | Freq: Every day | ORAL | 3 refills | Status: DC

## 2018-04-01 NOTE — Patient Instructions (Addendum)
Medication Instructions:  Your physician has recommended you make the following change in your medication:  1- START Aspirin 81 mg by mouth once a day.  If you need a refill on your cardiac medications before your next appointment, please call your pharmacy.   Lab work: none If you have labs (blood work) drawn today and your tests are completely normal, you will receive your results only by: Marland Kitchen MyChart Message (if you have MyChart) OR . A paper copy in the mail If you have any lab test that is abnormal or we need to change your treatment, we will call you to review the results.  Testing/Procedures: Your physician has requested that you have an echocardiogram. Echocardiography is a painless test that uses sound waves to create images of your heart. It provides your doctor with information about the size and shape of your heart and how well your heart's chambers and valves are working. This procedure takes approximately one hour. There are no restrictions for this procedure. You may get an IV, if needed, to receive an ultrasound enhancing agent through to better visualize your heart.     Follow-Up: At [facility], you and your health needs are our priority.  As part of our continuing mission to provide you with exceptional heart care, we have created designated Provider Care Teams.  These Care Teams include your primary Cardiologist (physician) and Advanced Practice Providers (APPs -  Physician Assistants and Nurse Practitioners) who all work together to provide you with the care you need, when you need it. You will need a follow up appointment in 4-6 weeks.  Please call our office 2 months in advance to schedule this appointment.  You may see DR Cristal Deer END or one of the following Advanced Practice Providers on your designated Care Team:   Nicolasa Ducking, NP Eula Listen, PA-C . Marisue Ivan, PA-C    Echocardiogram An echocardiogram is a procedure that uses painless sound waves  (ultrasound) to produce an image of the heart. Images from an echocardiogram can provide important information about:  Signs of coronary artery disease (CAD).  Aneurysm detection. An aneurysm is a weak or damaged part of an artery wall that bulges out from the normal force of blood pumping through the body.  Heart size and shape. Changes in the size or shape of the heart can be associated with certain conditions, including heart failure, aneurysm, and CAD.  Heart muscle function.  Heart valve function.  Signs of a past heart attack.  Fluid buildup around the heart.  Thickening of the heart muscle.  A tumor or infectious growth around the heart valves. Tell a health care provider about:  Any allergies you have.  All medicines you are taking, including vitamins, herbs, eye drops, creams, and over-the-counter medicines.  Any blood disorders you have.  Any surgeries you have had.  Any medical conditions you have.  Whether you are pregnant or may be pregnant. What are the risks? Generally, this is a safe procedure. However, problems may occur, including:  Allergic reaction to dye (contrast) that may be used during the procedure. What happens before the procedure? No specific preparation is needed. You may eat and drink normally. What happens during the procedure?   An IV tube may be inserted into one of your veins.  You may receive contrast through this tube. A contrast is an injection that improves the quality of the pictures from your heart.  A gel will be applied to your chest.  A wand-like  tool (transducer) will be moved over your chest. The gel will help to transmit the sound waves from the transducer.  The sound waves will harmlessly bounce off of your heart to allow the heart images to be captured in real-time motion. The images will be recorded on a computer. The procedure may vary among health care providers and hospitals. What happens after the  procedure?  You may return to your normal, everyday life, including diet, activities, and medicines, unless your health care provider tells you not to do that. Summary  An echocardiogram is a procedure that uses painless sound waves (ultrasound) to produce an image of the heart.  Images from an echocardiogram can provide important information about the size and shape of your heart, heart muscle function, heart valve function, and fluid buildup around your heart.  You do not need to do anything to prepare before this procedure. You may eat and drink normally.  After the echocardiogram is completed, you may return to your normal, everyday life, unless your health care provider tells you not to do that. This information is not intended to replace advice given to you by your health care provider. Make sure you discuss any questions you have with your health care provider. Document Released: 02/02/2000 Document Revised: 03/09/2016 Document Reviewed: 03/09/2016 Elsevier Interactive Patient Education  2019 [employer].

## 2018-04-03 ENCOUNTER — Ambulatory Visit (INDEPENDENT_AMBULATORY_CARE_PROVIDER_SITE_OTHER): Payer: Medicare Other

## 2018-04-03 DIAGNOSIS — R0789 Other chest pain: Secondary | ICD-10-CM

## 2018-04-03 DIAGNOSIS — R0609 Other forms of dyspnea: Secondary | ICD-10-CM

## 2018-04-03 DIAGNOSIS — I350 Nonrheumatic aortic (valve) stenosis: Secondary | ICD-10-CM | POA: Diagnosis not present

## 2018-04-03 DIAGNOSIS — R0600 Dyspnea, unspecified: Secondary | ICD-10-CM

## 2018-04-06 ENCOUNTER — Telehealth: Payer: Self-pay | Admitting: *Deleted

## 2018-04-06 DIAGNOSIS — I350 Nonrheumatic aortic (valve) stenosis: Secondary | ICD-10-CM

## 2018-04-06 NOTE — Telephone Encounter (Signed)
-----   Message from Yvonne Kendall, MD sent at 04/04/2018  2:52 PM EST ----- Please let Timothy Ramirez know that the echo shows that his heart is contracting well.  His aortic valve is again noted to be thickened an narrowed, slightly worse than on the prior study.  The narrowing (stenosis) is now in the moderate range.  I recommend that we obtain an exercise myocardial perfusion stress test at his convenience.

## 2018-04-06 NOTE — Telephone Encounter (Signed)
Results called to pt. Pt verbalized understanding. He is agreeable to the stress test. He would like me to speak to his daughter Ignacia Palma first. She is currently traveling to [place]. He wants to make sure they will be back before he schedules the myoview. He does not have a DPR on file. He verbalized understanding to complete the next time he's in the office.  He gave me verbal permission to speak with her when she calls back.

## 2018-04-06 NOTE — Telephone Encounter (Signed)
Patient's daughter calling back. She verbalized understanding of results as well. She scheduled patient for exercise myoview on 04/13/18 at 07:30 am, arrival of 07:15 am at the [facility]. She verbalized undresanging of the following instructions as well. These have been sent to a [employer] as well. Message sent to precert.  [facility] exercise MYOVIEW  Your caregiver has ordered a Stress Test with nuclear imaging. The purpose of this test is to evaluate the blood supply to your heart muscle. This procedure is referred to as a "Non-Invasive Stress Test." This is because other than having an IV started in your vein, nothing is inserted or "invades" your body. Cardiac stress tests are done to find areas of poor blood flow to the heart by determining the extent of coronary artery disease (CAD). Some patients exercise on a treadmill, which naturally increases the blood flow to your heart, while others who are  unable to walk on a treadmill due to physical limitations have a pharmacologic/chemical stress agent called Lexiscan . This medicine will mimic walking on a treadmill by temporarily increasing your coronary blood flow.   Please note: these test may take anywhere between 2-4 hours to complete  PLEASE REPORT TO [facility] MEDICAL MALL ENTRANCE  THE VOLUNTEERS AT THE FIRST DESK WILL DIRECT YOU WHERE TO GO  Date of Procedure:________02/24/20_________  Arrival Time for Procedure:______07:15 am______  Instructions regarding medication:   _xx_:  Hold other medications as follows:_______Hydrocholorothiazide_______  PLEASE NOTIFY THE OFFICE AT LEAST 24 HOURS IN ADVANCE IF YOU ARE UNABLE TO KEEP YOUR APPOINTMENT.  [phone] AND  PLEASE NOTIFY NUCLEAR MEDICINE AT [facility] AT LEAST 24 HOURS IN ADVANCE IF YOU ARE UNABLE TO KEEP YOUR APPOINTMENT. [phone]  How to prepare for your Myoview test:  1. Do not eat or drink after midnight 2. No caffeine for 24 hours prior to test 3. No smoking 24 hours  prior to test. 4. Your medication may be taken with water.  If your doctor stopped a medication because of this test, do not take that medication. 5. Pants are appropriate. Please wear a short sleeve shirt. 6. No perfume, cologne or lotion. Wear comfortable walking shoes.

## 2018-04-13 ENCOUNTER — Ambulatory Visit
Admission: RE | Admit: 2018-04-13 | Discharge: 2018-04-13 | Disposition: A | Discharge status: Home / Self Care | Payer: Medicare Other | Source: Ambulatory Visit | Attending: Internal Medicine | Admitting: Internal Medicine

## 2018-04-13 DIAGNOSIS — I350 Nonrheumatic aortic (valve) stenosis: Secondary | ICD-10-CM | POA: Diagnosis present

## 2018-04-13 LAB — NM MYOCAR MULTI W/SPECT W/WALL MOTION / EF
Estimated workload: 6.1 METS
Exercise duration (min): 4 min
Exercise duration (sec): 45 s
LV dias vol: 15 mL (ref 62–150)
LV sys vol: 39 mL
MPHR: 138 {beats}/min
Peak HR: 144 {beats}/min
Percent HR: 104 %
Rest HR: 66 {beats}/min
SDS: 0
SRS: 2
SSS: 0
TID: 1.04

## 2018-04-13 MED ORDER — TECHNETIUM TC 99M TETROFOSMIN IV KIT
32.8400 | PACK | Freq: Once | INTRAVENOUS | Status: AC | PRN
  Administered 2018-04-13: 32.84 via INTRAVENOUS

## 2018-04-13 MED ORDER — TECHNETIUM TC 99M TETROFOSMIN IV KIT
10.9000 | PACK | Freq: Once | INTRAVENOUS | Status: AC | PRN
  Administered 2018-04-13: 10.9 via INTRAVENOUS

## 2018-05-05 ENCOUNTER — Telehealth: Payer: Self-pay

## 2018-05-05 NOTE — Telephone Encounter (Signed)
Patient returning call.

## 2018-05-05 NOTE — Telephone Encounter (Signed)
Call to patient to reschedule in regards to COVID-19 restrictions.  No answer. LMOM.  

## 2018-05-05 NOTE — Telephone Encounter (Signed)
Call attempted. LMOM

## 2018-05-06 NOTE — Telephone Encounter (Signed)
Call to patient to reschedule in regards to COVID-19 restrictions.  ?  He is comfortable with rescheduling at this time and denies any new sx.    COVID-19 Pre-Screening:  1. Have you been in contact with someone who was sick?  NO 2. Do you have any of the following symptoms (cough, fever, muscle pain, vomiting, diarrhea, weakness abdominal pain, rash, red eye, bruising or bleeding, joint pain, severe headache)?  No new sx  3. Have you travelled internationally or out of state in the last month?  no 4. Do you need any refills at this time?  n/a  Patient aware of the following: Please be advised that we require, no one but yourself to come to appointment. If necessary, only one visitor may come with you into the building. They will also be asked the same screening questions. You will be contacted at a later time to reschedule. However, this will depend on ongoing evaluation of the Covid-19 situation.  Please call us if any new questions or concerns arise. We are here for advice.  Routing to COVID cancel pool.

## 2018-05-07 ENCOUNTER — Ambulatory Visit: Payer: Medicare Other | Admitting: Nurse Practitioner

## 2018-05-13 ENCOUNTER — Telehealth: Payer: Self-pay | Admitting: Internal Medicine

## 2018-05-13 NOTE — Telephone Encounter (Signed)
TELEPHONE CALL NOTE  Timothy Ramirez has been deemed a candidate for a follow-up tele-health visit to limit community exposure during the Covid-19 pandemic. I spoke with the patient via phone to ensure availability of phone/video source, confirm preferred email & phone number, discuss instructions and expectations, and review consent.   I reminded Timothy Ramirez to be prepared with any vital sign and/or heart rhythm information that could potentially be obtained via home monitoring, at the time of his visit.  Finally, I reminded Timothy Ramirez to expect an e-mail containing a link for their video-based visit approximately 15 minutes before his visit, or alternatively, a phone call at the time of his visit if his visit is planned to be a phone encounter.  Did the patient verbally consent to treatment as below? Yes  Norman Herrlich 05/13/2018 1:22 PM  DOWNLOADING THE SOFTWARE (If applicable)  Download the [employer] app to enable video and telephone visits with your [facility] Provider.   Instructions for downloading [employer] Web[employer]: - Go to https://www.webex.com/downloads.html and follow the instructions - If you have technical difficulties with downloading WebEx, please call WebEx at [phone]. - Once the app is downloaded (can be done on either mobile or desktop computer), go to Settings in the upper left hand corner.  Be sure that camera and audio are enabled.  - You will receive an email message with a link to the meeting with a time to join for your tele-health visit.  - Please download the app and have settings configured prior to the appointment time.    CONSENT FOR TELE-HEALTH VISIT - PLEASE REVIEW  I hereby voluntarily request, consent and authorize [facility] and its employed or contracted physicians, physician assistants, nurse practitioners or other licensed health care professionals (the Practitioner), to provide me with telemedicine health care services (the  Services") as deemed necessary by the treating Practitioner. I acknowledge and consent to receive the Services by the Practitioner via telemedicine. I understand that the telemedicine visit will involve communicating with the Practitioner through live audiovisual communication technology and the disclosure of certain medical information by electronic transmission. I acknowledge that I have been given the opportunity to request an in-person assessment or other available alternative prior to the telemedicine visit and am voluntarily participating in the telemedicine visit.  I understand that I have the right to withhold or withdraw my consent to the use of telemedicine in the course of my care at any time, without affecting my right to future care or treatment, and that the Practitioner or I may terminate the telemedicine visit at any time. I understand that I have the right to inspect all information obtained and/or recorded in the course of the telemedicine visit and may receive copies of available information for a reasonable fee.  I understand that some of the potential risks of receiving the Services via telemedicine include:   Delay or interruption in medical evaluation due to technological equipment failure or disruption;  Information transmitted may not be sufficient (e.g. poor resolution of images) to allow for appropriate medical decision making by the Practitioner; and/or   In rare instances, security protocols could fail, causing a breach of personal health information.  Furthermore, I acknowledge that it is my responsibility to provide information about my medical history, conditions and care that is complete and accurate to the best of my ability. I acknowledge that Practitioner's advice, recommendations, and/or decision may be based on factors not within their control, such as incomplete or  inaccurate data provided by me or distortions of diagnostic images or specimens that may result from  electronic transmissions. I understand that the practice of medicine is not an exact science and that Practitioner makes no warranties or guarantees regarding treatment outcomes. I acknowledge that I will receive a copy of this consent concurrently upon execution via email to the email address I last provided but may also request a printed copy by calling the office of [place].    I understand that my insurance will be billed for this visit.   I have read or had this consent read to me.  I understand the contents of this consent, which adequately explains the benefits and risks of the Services being provided via telemedicine.   I have been provided ample opportunity to ask questions regarding this consent and the Services and have had my questions answered to my satisfaction.  I give my informed consent for the services to be provided through the use of telemedicine in my medical care  By participating in this telemedicine visit I agree to the above.

## 2018-05-13 NOTE — Progress Notes (Signed)
Virtual Visit via Telephone Note    Evaluation Performed:  Follow-up visit  This visit type was conducted due to national recommendations for restrictions regarding the COVID-19 Pandemic (e.g. social distancing).  This format is felt to be most appropriate for this patient at this time.  All issues noted in this document were discussed and addressed.  No physical exam was performed (except for noted visual exam findings with Video Visits).  Verbal consent obtained from the patient.  Date:  05/20/2018   ID:  Timothy Ramirez, DOB [birth year], MRN [id number]  Patient Location:  [address] [place] [address]   Provider location:   [facility] [place] [address], [place], [place] [address]  PCP:  Maryland Pink, MD  Cardiologist:  Nelva Bush, MD  Electrophysiologist:  None   Chief Complaint:  Shortness of breath  History of Present Illness:    Timothy Ramirez is a 85 y.o. male who presents via audio/video conferencing for a telehealth visit today.  He has a history of mild aortic stenosis, hypertension, and hyperlipidemia; we are follow-up by phone to reassess left arm and shoulder blade that led to an ED visit in February.  I met him on 04/01/2018, at which time he reported burning pain in the left arm radiating to the shoulder blade that had intermittently been present for many years.  However, pain also radiated to the chest, which was new, and led to the aforementioned ED visit.  Subsequent echo showed moderate aortic stenosis with normal LVEF and mild to moderate LVH.  Mild to moderate mitral regurgitation was also noted.  Myocardial perfusion stress test was low risk without ischemia or scar.  Significant dyspnea on exertion was noted, as well as multiple PVC's during recovery.   Today, [name] reports that he has been feeling well.  He has not had any further episodes of chest/shoulder blade/left arm pain since  our last visit.  He did not have any exertional pain with the stress test in February either.  He has stable exertional dyspnea as well as mild shortness of breath at times when he bends over to tie his shoes.  He denies orthopnea, edema, palpitations, and lightheadedness.  He is tolerating his current medications well.  He is practicing social distancing.  The patient does not endorse symptoms concerning for COVID-19 infection (fever, chills, cough, or new SHORTNESS OF BREATH).    Prior CV studies:   The following studies were reviewed today:  Exercise MPI (04/13/2018): Low risk study without ischemia or scar.  Significant shortness of breath noted during stress (4:45 minutes) with frequent PVC's during recovery.  TTE (04/03/2018): Normal LV size with mild to moderate LVH.  LVEF > 55% with grade 1 diastolic dysfunction.  Normal RV size and function.  Mild LAE.  Aortic valve severely calcified with moderate stenosis (mean gradient 23 mmHg).  Mild to moderate MR.  Past Medical History:  Diagnosis Date  . Borderline diabetes   . Hyperlipidemia   . Hypertension    Past Surgical History:  Procedure Laterality Date  . APPENDECTOMY    . HERNIA REPAIR    . TONSILLECTOMY       Current Meds  Medication Sig  . aspirin EC 81 MG tablet Take 1 tablet (81 mg total) by mouth daily.  Marland Kitchen atorvastatin (LIPITOR) 20 MG tablet Take 20 mg by mouth every morning.  . hydrochlorothiazide (HYDRODIURIL) 25 MG tablet Take  25 mg by mouth daily.  . potassium chloride (KLOR-CON 10) 10 MEQ tablet Take 10 mEq by mouth daily.  . temazepam (RESTORIL) 15 MG capsule Take 15 mg by mouth at bedtime as needed for sleep.     Allergies:   Patient has no known allergies.   Social History   Tobacco Use  . Smoking status: Never Smoker  . Smokeless tobacco: Never Used  Substance Use Topics  . Alcohol use: Yes    Alcohol/week: 2.0 standard drinks    Types: 1 Cans of beer, 1 Shots of liquor per week  . Drug use: No      Family Hx: The patient's family history includes COPD in his sister; Cirrhosis in his father; Congestive Heart Failure in his mother; Heart attack (age of onset: 53) in his mother.  ROS:   Please see the history of present illness.   All other systems reviewed and are negative.   Labs/Other Tests and Data Reviewed:    Recent Labs: 03/23/2018: ALT 14; BUN 21; Creatinine, Ser 1.21; Hemoglobin 15.2; Platelets 121; Potassium 3.0; Sodium 138   Recent Lipid Panel No results found for: CHOL, TRIG, HDL, CHOLHDL, LDLCALC, LDLDIRECT  Wt Readings from Last 3 Encounters:  05/20/18 200 lb (90.7 kg)  04/01/18 199 lb (90.3 kg)  03/23/18 197 lb (89.4 kg)     Exam:    Vital Signs:  Ht _0  (1.778 m)   Wt 200 lb (90.7 kg)   BMI 28.70 kg/m    ASSESSMENT & PLAN:    Atypical chest pain and shortness of breath: Stable exertional dyspnea noted without any further episodes of chest/shoulder blade/left arm pain.  I suspect symptoms are multifactorial, including moderate AS, diastolic dysfunction, and age.  We have agreed to defer additional ischemia workup (i.e. catheterization) at this time.  However, is symptoms were to worsen, we would need to consider this in the future.  Aortic stenosis and mitral regurgitation: Moderate aortic stenosis and mild to moderate mitral regurgitation noted on recent echo.  We have agreed to conservative therapy, with clinical follow-up in 6 months and repeat echo in 1 year.  I advised Mr. Lender to contact us if he has worsening DOE, recurrent chest pain, or new edema in the meantime.  Hypertension: No BP readings at home.  BP was upper normal at office visit in February.  I will defer ongoing management to Dr. Kary Kos.  Hyperlipidemia: Continue atorvastatin and follow-up with Dr. Kary Kos.  COVID-19 Education: The signs and symptoms of COVID-19 were discussed with the patient and how to seek care for testing (follow up with PCP or arrange E-visit).  The importance  of social distancing was discussed today.  Patient Risk:   After full review of this patients clinical status, I feel that they are at least moderate risk at this time.  Time:   Today, I have spent 12 minutes with the patient with telehealth technology discussing results of recent testing, shortness of breath, and COVID-19 precautions.     Medication Adjustments/Labs and Tests Ordered: Current medicines are reviewed at length with the patient today.  Concerns regarding medicines are outlined above.   Tests Ordered: None.  Medication Changes: None.  Disposition:  in 6 month(s)  Signed, Nelva Bush, MD  05/20/2018 9:14 AM    [facility]

## 2018-05-13 NOTE — Telephone Encounter (Signed)
Telephone visit scheduled 4/1 at 9am - Dr End

## 2018-05-20 ENCOUNTER — Telehealth (INDEPENDENT_AMBULATORY_CARE_PROVIDER_SITE_OTHER): Payer: Medicare Other | Admitting: Internal Medicine

## 2018-05-20 ENCOUNTER — Other Ambulatory Visit: Payer: Self-pay

## 2018-05-20 VITALS — Ht 70.0 in | Wt 200.0 lb

## 2018-05-20 DIAGNOSIS — R0789 Other chest pain: Secondary | ICD-10-CM | POA: Diagnosis not present

## 2018-05-20 DIAGNOSIS — E785 Hyperlipidemia, unspecified: Secondary | ICD-10-CM

## 2018-05-20 DIAGNOSIS — R0602 Shortness of breath: Secondary | ICD-10-CM | POA: Diagnosis not present

## 2018-05-20 DIAGNOSIS — E782 Mixed hyperlipidemia: Secondary | ICD-10-CM | POA: Insufficient documentation

## 2018-05-20 DIAGNOSIS — I340 Nonrheumatic mitral (valve) insufficiency: Secondary | ICD-10-CM | POA: Diagnosis not present

## 2018-05-20 DIAGNOSIS — I10 Essential (primary) hypertension: Secondary | ICD-10-CM

## 2018-05-20 DIAGNOSIS — I350 Nonrheumatic aortic (valve) stenosis: Secondary | ICD-10-CM | POA: Diagnosis not present

## 2018-05-20 NOTE — Patient Instructions (Addendum)
Medication Instructions:  Your physician recommends that you continue on your current medications as directed. Please refer to the Current Medication list given to you today.  If you need a refill on your cardiac medications before your next appointment, please call your pharmacy.   Lab work: none If you have labs (blood work) drawn today and your tests are completely normal, you will receive your results only by: Marland Kitchen MyChart Message (if you have MyChart) OR . A paper copy in the mail If you have any lab test that is abnormal or we need to change your treatment, we will call you to review the results.  Testing/Procedures: none  Follow-Up: At [facility], you and your health needs are our priority.  As part of our continuing mission to provide you with exceptional heart care, we have created designated Provider Care Teams.  These Care Teams include your primary Cardiologist (physician) and Advanced Practice Providers (APPs -  Physician Assistants and Nurse Practitioners) who all work together to provide you with the care you need, when you need it. You will need a follow up appointment in 6 months.  Please call our office 2 months in advance to schedule this appointment.  You may see Yvonne Kendall, MD or one of the following Advanced Practice Providers on your designated Care Team:   Nicolasa Ducking, NP Eula Listen, PA-C . Marisue Ivan, PA-C  Any Other Special Instructions Will Be Listed Below (If Applicable).  Please let our office know if you have worsening shortness of breath, edema (swelling), or recurrent chest pain.

## 2019-03-18 ENCOUNTER — Encounter: Payer: Self-pay | Admitting: Internal Medicine

## 2019-03-18 ENCOUNTER — Other Ambulatory Visit: Payer: Self-pay

## 2019-03-18 ENCOUNTER — Ambulatory Visit (INDEPENDENT_AMBULATORY_CARE_PROVIDER_SITE_OTHER): Payer: Medicare Other | Admitting: Internal Medicine

## 2019-03-18 VITALS — BP 124/78 | HR 70 | Ht 70.0 in | Wt 188.2 lb

## 2019-03-18 DIAGNOSIS — E785 Hyperlipidemia, unspecified: Secondary | ICD-10-CM

## 2019-03-18 DIAGNOSIS — R079 Chest pain, unspecified: Secondary | ICD-10-CM | POA: Diagnosis not present

## 2019-03-18 DIAGNOSIS — I452 Bifascicular block: Secondary | ICD-10-CM

## 2019-03-18 DIAGNOSIS — I350 Nonrheumatic aortic (valve) stenosis: Secondary | ICD-10-CM | POA: Diagnosis not present

## 2019-03-18 DIAGNOSIS — I10 Essential (primary) hypertension: Secondary | ICD-10-CM | POA: Diagnosis not present

## 2019-03-18 NOTE — Progress Notes (Signed)
Follow-up Outpatient Visit Date: 03/18/2019  Primary Care Provider: Maryland Pink, MD [place] [id number]  Chief Complaint: Follow-up aortic stenosis and chest pain  HPI:  Timothy Ramirez is a 86 y.o. male with history of aortic stenosis, hypertension, and hyperlipidemia, who presents for follow-up of chest pain and valvular heart disease.  I met him in 03/2018 following ED visit for chest and left arm pain, which has been sporadically present for years.  ED work-up was unrevealing.  We agreed to obtain an echocardiogram, which showed normal LVEF with mild to moderate LVH, grade 1 diastolic dysfunction, and moderate aortic stenosis (mean gradient 23 mmHg).  Mild to moderate mitral regurgitation was also noted.  Myocardial perfusion stress test was low risk without ischemia or scar, though severe shortness of breath limited exercise.  At the time of virtual visit follow-up in early April, Timothy Ramirez reported feeling well without further chest pain.  Exertional dyspnea was stable.  Further work-up was deferred.  Today, Timothy Ramirez feels well, denying chest pain, shortness of breath, palpitations, lightheadedness, and edema.  He has not had any further pain in his left arm.  He has received his first COVID-19 vaccine and is scheduled for his second dose next week.  --------------------------------------------------------------------------------------------------  Cardiovascular History & Procedures: Cardiovascular Problems:  Aortic stenosis  Chest and left arm pain  Risk Factors:  Hypertension, hyperlipidemia, borderline diabetes mellitus, male gender, and age greater than 49  Cath/PCI:  None  CV Surgery:  None  EP Procedures and Devices:  None.  Non-Invasive Evaluation(s):  Exercise MPI (04/13/2018): Low risk study without ischemia or scar.  LVEF 55-65%.  Limited exercise capacity due to significant shortness of breath.  Frequent PVCs in  recovery.  TTE (04/03/2018): Normal LV size with mild to moderate LVH.  LVEF greater than 55% with grade 1 diastolic dysfunction.  Normal RV size and function.  Mild left atrial enlargement.  Calcified aortic valve with moderate stenosis (mean gradient 23 mmHg).  Mild to moderate mitral regurgitation.  TTE (09/16/2016 [facility]): Mild LVH with normal LVEF (55 to 60%).  Normal RV size and function.  Moderate mitral regurgitation.  Trace aortic regurgitation.  Mild aortic stenosis.  Exercise MPI (10/30/2011): Negative exercise tolerance test.  No evidence of ischemia or scar by myocardial perfusion.  Normal LVEF (73%).  Recent CV Pertinent Labs: Lab Results  Component Value Date   K 3.0 (L) 03/23/2018   K 3.5 02/24/2014   BUN 21 03/23/2018   BUN 22 (H) 10/22/2011   CREATININE 1.21 03/23/2018   CREATININE 1.30 10/22/2011    Past medical and surgical history were reviewed and updated in EPIC.  Current Meds  Medication Sig  . aspirin EC 81 MG tablet Take 1 tablet (81 mg total) by mouth daily.  Marland Kitchen atorvastatin (LIPITOR) 20 MG tablet Take 20 mg by mouth every morning.  . hydrochlorothiazide (HYDRODIURIL) 25 MG tablet Take 25 mg by mouth daily.  . potassium chloride (KLOR-CON 10) 10 MEQ tablet Take 10 mEq by mouth daily.  . temazepam (RESTORIL) 15 MG capsule Take 15 mg by mouth at bedtime as needed for sleep.    Allergies: Patient has no known allergies.  Social History   Tobacco Use  . Smoking status: Never Smoker  . Smokeless tobacco: Never Used  Substance Use Topics  . Alcohol use: Yes    Alcohol/week: 2.0 standard drinks    Types: 1 Cans of beer, 1 Shots of liquor  per week  . Drug use: No    Family History  Problem Relation Age of Onset  . Congestive Heart Failure Mother   . Heart attack Mother 60  . Cirrhosis Father   . COPD Sister     Review of Systems: A 12-system review of systems was performed and was negative except as noted in the  HPI.  --------------------------------------------------------------------------------------------------  Physical Exam: BP 124/78 (BP Location: Left Arm, Patient Position: Sitting, Cuff Size: Normal)   Pulse 70   Ht '5\' 10"'  (1.778 m)   Wt 188 lb 4 oz (85.4 kg)   SpO2 91%   BMI 27.01 kg/m   General: NAD. HEENT: No conjunctival pallor or scleral icterus. Facemask in place. Neck: Supple without lymphadenopathy, thyromegaly, JVD, or HJR. Lungs: Normal work of breathing. Clear to auscultation bilaterally without wheezes or crackles. Heart: Regular rate and rhythm with 2/6 systolic murmur loudest at the left lower sternal border.  No rubs or gallops.  Nondisplaced PMI. Abd: Bowel sounds present. Soft, NT/ND without hepatosplenomegaly Ext: No lower extremity edema. Skin: Warm and dry without rash.  EKG: Normal sinus rhythm with bifascicular block (RBBB and LAFB) and anterolateral T wave inversions (new since 04/01/2018).  Lab Results  Component Value Date   WBC 7.3 03/23/2018   HGB 15.2 03/23/2018   HCT 45.7 03/23/2018   MCV 90.7 03/23/2018   PLT 121 (L) 03/23/2018    Lab Results  Component Value Date   NA 138 03/23/2018   K 3.0 (L) 03/23/2018   CL 107 03/23/2018   CO2 26 03/23/2018   BUN 21 03/23/2018   CREATININE 1.21 03/23/2018   GLUCOSE 157 (H) 03/23/2018   ALT 14 03/23/2018    No results found for: CHOL, HDL, LDLCALC, LDLDIRECT, TRIG, CHOLHDL  --------------------------------------------------------------------------------------------------  ASSESSMENT AND PLAN: Aortic stenosis: Moderate aortic stenosis noted on echo last year.  No symptoms to suggest severe AS today.  Mr. Pech appears euvolemic and well compensated.  We will plan to repeat an echocardiogram shortly before Timothy Ramirez follows up in 1 year.  Bifascicular block: No symptoms to suggest high-grade AV block.  Continue clinical follow-up.  Chest pain: No further episodes of chest or left arm pain  since we last spoke.  EKG today shows anterolateral T wave inversions, more pronounced than on prior tracings.  Myocardial perfusion stress test low risk albeit with limited exercise capacity.  Given no further chest pain or significant dyspnea, we have agreed to defer additional testing.  Hypertension: Blood pressure well controlled.  Continue HCTZ and potassium chrloide, with ongoing follow-up per Dr. Kary Kos.  Hyperlipidemia: Continue atorvastatin 20 mg daily and follow-up with Dr. Kary Kos.  Follow-up: Return to clinic in 1 year with echocardiogram shortly before visit.  Nelva Bush, MD 03/18/2019 2:03 PM

## 2019-03-18 NOTE — Patient Instructions (Signed)
Medication Instructions:  Your physician recommends that you continue on your current medications as directed. Please refer to the Current Medication list given to you today.  *If you need a refill on your cardiac medications before your next appointment, please call your pharmacy*  Lab Work: none If you have labs (blood work) drawn today and your tests are completely normal, you will receive your results only by: Marland Kitchen MyChart Message (if you have MyChart) OR . A paper copy in the mail If you have any lab test that is abnormal or we need to change your treatment, we will call you to review the results.  Testing/Procedures: In one year prior to follow up appointment - Your physician has requested that you have an echocardiogram. Echocardiography is a painless test that uses sound waves to create images of your heart. It provides your doctor with information about the size and shape of your heart and how well your heart's chambers and valves are working. This procedure takes approximately one hour. There are no restrictions for this procedure. You may get an IV, if needed, to receive an ultrasound enhancing agent through to better visualize your heart.   Follow-Up: At [facility], you and your health needs are our priority.  As part of our continuing mission to provide you with exceptional heart care, we have created designated Provider Care Teams.  These Care Teams include your primary Cardiologist (physician) and Advanced Practice Providers (APPs -  Physician Assistants and Nurse Practitioners) who all work together to provide you with the care you need, when you need it.  Your next appointment:   12 month(s)  The format for your next appointment:   In Person  Provider:    You may see Yvonne Kendall, MD or one of the following Advanced Practice Providers on your designated Care Team:    Nicolasa Ducking, NP  Eula Listen, PA-C  Marisue Ivan, PA-C

## 2019-05-04 ENCOUNTER — Other Ambulatory Visit: Payer: Medicare Other

## 2019-08-04 ENCOUNTER — Encounter: Payer: Self-pay | Admitting: Urology

## 2019-08-04 ENCOUNTER — Other Ambulatory Visit: Payer: Self-pay

## 2019-08-04 ENCOUNTER — Ambulatory Visit: Payer: Medicare Other | Admitting: Urology

## 2019-08-04 ENCOUNTER — Ambulatory Visit (INDEPENDENT_AMBULATORY_CARE_PROVIDER_SITE_OTHER): Payer: Medicare Other | Admitting: Urology

## 2019-08-04 VITALS — BP 128/72 | HR 105 | Ht 70.0 in | Wt 195.0 lb

## 2019-08-04 DIAGNOSIS — R3129 Other microscopic hematuria: Secondary | ICD-10-CM | POA: Diagnosis not present

## 2019-08-04 DIAGNOSIS — N400 Enlarged prostate without lower urinary tract symptoms: Secondary | ICD-10-CM | POA: Diagnosis not present

## 2019-08-04 NOTE — Progress Notes (Signed)
07/20/19 12:46 PM   Timothy Ramirez [birth year] [id number]  Referring provider: Maryland Pink, MD [address] [facility] [place],  [place] [address] Chief Complaint  Patient presents with  . Hematuria    HPI: Timothy Ramirez is a 86 y.o. male seen at the request of Dr. Kary Kos for hematuria.    -Evaluated by Dr. Jacqlyn Larsen for microscopic hematuria in 2015 and had negative CT and cystoscopy -Non-contrast CT performed in 2016 showed 2 mm renal calculus - 2 UAs in 05/2019 and 06/2019 showed 4-10 RBCs -Denies gross hematuria -Denies bothersome LUTS -Denies flank, abdominal, pelvic pain  PMH: Past Medical History:  Diagnosis Date  . Borderline diabetes   . Hyperlipidemia   . Hypertension     Surgical History: Past Surgical History:  Procedure Laterality Date  . APPENDECTOMY    . HERNIA REPAIR    . TONSILLECTOMY      Home Medications:  Allergies as of 08/04/2019   No Known Allergies     Medication List       Accurate as of August 04, 2019 12:46 PM. If you have any questions, ask your nurse or doctor.        aspirin EC 81 MG tablet Take 1 tablet (81 mg total) by mouth daily.   atorvastatin 20 MG tablet Commonly known as: LIPITOR Take 20 mg by mouth every morning.   hydrochlorothiazide 25 MG tablet Commonly known as: HYDRODIURIL Take 25 mg by mouth daily.   Klor-Con 10 10 MEQ tablet Generic drug: potassium chloride Take 10 mEq by mouth daily.   temazepam 15 MG capsule Commonly known as: RESTORIL Take 15 mg by mouth at bedtime as needed for sleep.       Allergies: No Known Allergies  Family History: Family History  Problem Relation Age of Onset  . Congestive Heart Failure Mother   . Heart attack Mother 25  . Cirrhosis Father   . COPD Sister     Social History:  reports that he has never smoked. He has never used smokeless tobacco. He reports current alcohol use of about 2.0 standard drinks of alcohol per week. He reports that he does not use  drugs.   Physical Exam: BP 128/72   Pulse (!) 105   Ht 5\' 10"  (1.778 m)   Wt 195 lb (88.5 kg)   BMI 27.98 kg/m   Constitutional:  Alert and oriented, No acute distress. HEENT: [place] AT, moist mucus membranes.  Trachea midline, no masses. Cardiovascular: No clubbing, cyanosis, or edema. Respiratory: Normal respiratory effort, no increased work of breathing. GI: Abdomen is soft, nontender, nondistended, no abdominal masses GU: No CVA tenderness. Phallus was uncircumcised without lesions. Testes descended bilaterally without masses. Prostate is 60 grams, smooth without nodules.  Skin: No rashes, bruises or suspicious lesions. Neurologic: Grossly intact, no focal deficits, moving all 4 extremities. Psychiatric: Normal mood and affect.  Laboratory Data:  Urinalysis dipstick trace blood, trace ketone Microscopy negative   Assessment & Plan:    1. Asymptomatic Microscopic Hematuria -AUA risk stratification: High -Discussed potential causes of microscopic hematuria including benign and malignant etiologies.  Standard evaluation for high risk hematuria was discussed in detail -Schedule CT urogram and cystoscopy  [facility] [address], [place], [place] [address] [phone]  I, Joneen Boers Peace, am acting as a Education administrator for Dr. Nicki Reaper C. Ariyan Brisendine.  I have reviewed the above documentation for accuracy and completeness, and I agree with the above.  Riki Altes, MD

## 2019-08-09 LAB — MICROSCOPIC EXAMINATION
Bacteria, UA: NONE SEEN
Epithelial Cells (non renal): NONE SEEN /hpf (ref 0–10)
WBC, UA: NONE SEEN /hpf (ref 0–5)

## 2019-08-09 LAB — URINALYSIS, COMPLETE
Bilirubin, UA: NEGATIVE
Glucose, UA: NEGATIVE
Leukocytes,UA: NEGATIVE
Nitrite, UA: NEGATIVE
Protein,UA: NEGATIVE
Specific Gravity, UA: 1.025 (ref 1.005–1.030)
Urobilinogen, Ur: 1 mg/dL (ref 0.2–1.0)
pH, UA: 5.5 (ref 5.0–7.5)

## 2019-09-03 ENCOUNTER — Other Ambulatory Visit: Payer: Self-pay

## 2019-09-03 ENCOUNTER — Ambulatory Visit
Admission: RE | Admit: 2019-09-03 | Discharge: 2019-09-03 | Disposition: A | Discharge status: Home / Self Care | Payer: Medicare Other | Source: Ambulatory Visit | Attending: Urology | Admitting: Urology

## 2019-09-03 DIAGNOSIS — R3129 Other microscopic hematuria: Secondary | ICD-10-CM | POA: Insufficient documentation

## 2019-09-03 LAB — POCT I-STAT CREATININE: Creatinine, Ser: 1.4 mg/dL — ABNORMAL HIGH (ref 0.61–1.24)

## 2019-09-03 MED ORDER — IOHEXOL 300 MG/ML  SOLN
100.0000 mL | Freq: Once | INTRAMUSCULAR | Status: AC | PRN
  Administered 2019-09-03: 100 mL via INTRAVENOUS

## 2019-09-08 ENCOUNTER — Other Ambulatory Visit: Payer: Self-pay

## 2019-09-08 ENCOUNTER — Encounter: Payer: Self-pay | Admitting: Urology

## 2019-09-08 ENCOUNTER — Ambulatory Visit (INDEPENDENT_AMBULATORY_CARE_PROVIDER_SITE_OTHER): Payer: Medicare Other | Admitting: Urology

## 2019-09-08 VITALS — BP 137/71 | HR 78 | Ht 70.0 in | Wt 195.0 lb

## 2019-09-08 DIAGNOSIS — R3129 Other microscopic hematuria: Secondary | ICD-10-CM | POA: Diagnosis not present

## 2019-09-08 NOTE — Progress Notes (Signed)
   09/08/19  CC:  Chief Complaint  Patient presents with  . Cysto   Indications: Asymptomatic microscopic hematuria  HPI:  Blood pressure 137/71, pulse 78, height 5\' 10"  (1.778 m), weight 195 lb (88.5 kg). NED. A&Ox3.   No respiratory distress   Abd soft, NT, ND Normal phallus with bilateral descended testicles  Cystoscopy Procedure Note  Patient identification was confirmed, informed consent was obtained, and patient was prepped using Betadine solution.  Lidocaine jelly was administered per urethral meatus.     Pre-Procedure: - Inspection reveals a normal caliber urethral meatus.  Procedure: The flexible cystoscope was introduced without difficulty - No urethral strictures/lesions are present. - Prominent lateral lobe enlargement with hypervascularity and friability prostate  - Moderate elevation bladder neck - Bilateral ureteral orifices identified - Bladder mucosa  reveals no ulcers, tumors, or lesions - No bladder stones -Moderate trabeculation  Retroflexion shows no tumor or intravesical median lobe   Post-Procedure: - Patient tolerated the procedure well  Imaging: CLINICAL DATA:  Microscopic hematuria.  Asymptomatic.  EXAM: CT ABDOMEN AND PELVIS WITHOUT AND WITH CONTRAST  TECHNIQUE: Multidetector CT imaging of the abdomen and pelvis was performed following the standard protocol before and following the bolus administration of intravenous contrast.  CONTRAST:  OMNIPAQUE IOHEXOL 300 MG/ML  SOLN  COMPARISON:  Stone study 09/05/2014  FINDINGS: Lower chest: Clear lung bases. Normal heart size without pericardial or pleural effusion. Aortic valve repair. Tiny hiatal hernia.  Hepatobiliary: Normal liver. Cholecystectomy, without biliary ductal dilatation.  Pancreas: Moderate pancreatic atrophy. No duct dilatation or acute inflammation.  Spleen: Normal in size, without focal abnormality.  Adrenals/Urinary Tract: Normal adrenal glands.  No renal calculi or hydronephrosis. No hydroureter or ureteric calculi. No bladder calculi. 2.0 cm lower pole left renal cyst. No suspicious renal mass on post-contrast imaging. Moderate renal collecting system opacification on delayed images. Short segment mid right ureteric suboptimal opacification. Otherwise, good ureteric opacification, without filling defect.  No enhancing bladder mass or filling defect on delayed images.  Stomach/Bowel: Normal remainder of the stomach. Scattered colonic diverticula. Normal terminal ileum and appendix. Normal small bowel.  Vascular/Lymphatic: Advanced aortic and branch vessel atherosclerosis. No abdominopelvic adenopathy.  Reproductive: Mild prostatomegaly.  Other: No significant free fluid. A small fat containing right inguinal hernia.  Musculoskeletal: Lumbosacral spondylosis with minimal S shaped lumbar spine curvature.  IMPRESSION: 1. No acute process or explanation for hematuria. 2. Tiny hiatal hernia. 3. Prostatomegaly. 4. Aortic Atherosclerosis (ICD10-I70.0).   Electronically Signed   By: 09/07/2014 M.D.   On: 09/03/2019 13:55  Assessment/ Plan:  Hematuria most likely secondary to BPH  No significant upper tract abnormalities on CTU  Follow-up 6 months   09/05/2019, MD

## 2019-09-09 ENCOUNTER — Encounter: Payer: Self-pay | Admitting: Urology

## 2019-09-09 LAB — URINALYSIS, COMPLETE
Bilirubin, UA: NEGATIVE
Glucose, UA: NEGATIVE
Ketones, UA: NEGATIVE
Leukocytes,UA: NEGATIVE
Nitrite, UA: NEGATIVE
Protein,UA: NEGATIVE
Specific Gravity, UA: 1.025 (ref 1.005–1.030)
Urobilinogen, Ur: 1 mg/dL (ref 0.2–1.0)
pH, UA: 5.5 (ref 5.0–7.5)

## 2019-09-09 LAB — MICROSCOPIC EXAMINATION: Epithelial Cells (non renal): NONE SEEN /hpf (ref 0–10)

## 2019-11-07 ENCOUNTER — Observation Stay
Admission: EM | Admit: 2019-11-07 | Discharge: 2019-11-09 | Disposition: A | Discharge status: Home / Self Care | Payer: Medicare Other | Attending: Family Medicine | Admitting: Family Medicine

## 2019-11-07 ENCOUNTER — Encounter: Payer: Self-pay | Admitting: Emergency Medicine

## 2019-11-07 ENCOUNTER — Other Ambulatory Visit: Payer: Self-pay

## 2019-11-07 ENCOUNTER — Emergency Department: Payer: Medicare Other

## 2019-11-07 DIAGNOSIS — R262 Difficulty in walking, not elsewhere classified: Secondary | ICD-10-CM | POA: Insufficient documentation

## 2019-11-07 DIAGNOSIS — I10 Essential (primary) hypertension: Secondary | ICD-10-CM | POA: Diagnosis not present

## 2019-11-07 DIAGNOSIS — E119 Type 2 diabetes mellitus without complications: Secondary | ICD-10-CM | POA: Diagnosis not present

## 2019-11-07 DIAGNOSIS — Z20822 Contact with and (suspected) exposure to covid-19: Secondary | ICD-10-CM | POA: Diagnosis not present

## 2019-11-07 DIAGNOSIS — K5900 Constipation, unspecified: Secondary | ICD-10-CM | POA: Diagnosis present

## 2019-11-07 DIAGNOSIS — I350 Nonrheumatic aortic (valve) stenosis: Secondary | ICD-10-CM | POA: Diagnosis not present

## 2019-11-07 DIAGNOSIS — E876 Hypokalemia: Secondary | ICD-10-CM

## 2019-11-07 DIAGNOSIS — Z7982 Long term (current) use of aspirin: Secondary | ICD-10-CM | POA: Insufficient documentation

## 2019-11-07 DIAGNOSIS — Z79899 Other long term (current) drug therapy: Secondary | ICD-10-CM | POA: Diagnosis not present

## 2019-11-07 DIAGNOSIS — R001 Bradycardia, unspecified: Principal | ICD-10-CM | POA: Insufficient documentation

## 2019-11-07 DIAGNOSIS — I4510 Unspecified right bundle-branch block: Secondary | ICD-10-CM

## 2019-11-07 DIAGNOSIS — R778 Other specified abnormalities of plasma proteins: Secondary | ICD-10-CM

## 2019-11-07 LAB — CBC WITH DIFFERENTIAL/PLATELET
Abs Immature Granulocytes: 0.02 10*3/uL (ref 0.00–0.07)
Basophils Absolute: 0.1 10*3/uL (ref 0.0–0.1)
Basophils Relative: 1 %
Eosinophils Absolute: 0 10*3/uL (ref 0.0–0.5)
Eosinophils Relative: 1 %
HCT: 48.6 % (ref 39.0–52.0)
Hemoglobin: 18.1 g/dL — ABNORMAL HIGH (ref 13.0–17.0)
Immature Granulocytes: 0 %
Lymphocytes Relative: 31 %
Lymphs Abs: 2.3 10*3/uL (ref 0.7–4.0)
MCH: 32.9 pg (ref 26.0–34.0)
MCHC: 37.2 g/dL — ABNORMAL HIGH (ref 30.0–36.0)
MCV: 88.4 fL (ref 80.0–100.0)
Monocytes Absolute: 0.7 10*3/uL (ref 0.1–1.0)
Monocytes Relative: 9 %
Neutro Abs: 4.4 10*3/uL (ref 1.7–7.7)
Neutrophils Relative %: 58 %
Platelets: 148 10*3/uL — ABNORMAL LOW (ref 150–400)
RBC: 5.5 MIL/uL (ref 4.22–5.81)
RDW: 14.6 % (ref 11.5–15.5)
WBC: 7.4 10*3/uL (ref 4.0–10.5)
nRBC: 0 % (ref 0.0–0.2)

## 2019-11-07 LAB — COMPREHENSIVE METABOLIC PANEL
ALT: 18 U/L (ref 0–44)
AST: 25 U/L (ref 15–41)
Albumin: 3.9 g/dL (ref 3.5–5.0)
Alkaline Phosphatase: 118 U/L (ref 38–126)
Anion gap: 15 (ref 5–15)
BUN: 28 mg/dL — ABNORMAL HIGH (ref 8–23)
CO2: 25 mmol/L (ref 22–32)
Calcium: 9 mg/dL (ref 8.9–10.3)
Chloride: 97 mmol/L — ABNORMAL LOW (ref 98–111)
Creatinine, Ser: 1.52 mg/dL — ABNORMAL HIGH (ref 0.61–1.24)
GFR calc Af Amer: 48 mL/min — ABNORMAL LOW (ref >60)
GFR calc non Af Amer: 41 mL/min — ABNORMAL LOW (ref >60)
Glucose, Bld: 127 mg/dL — ABNORMAL HIGH (ref 70–99)
Potassium: 3.2 mmol/L — ABNORMAL LOW (ref 3.5–5.1)
Sodium: 137 mmol/L (ref 135–145)
Total Bilirubin: 2.6 mg/dL — ABNORMAL HIGH (ref 0.3–1.2)
Total Protein: 7.4 g/dL (ref 6.5–8.1)

## 2019-11-07 LAB — URINALYSIS, COMPLETE (UACMP) WITH MICROSCOPIC
Bacteria, UA: NONE SEEN
Bilirubin Urine: NEGATIVE
Glucose, UA: NEGATIVE mg/dL
Hgb urine dipstick: NEGATIVE
Ketones, ur: NEGATIVE mg/dL
Leukocytes,Ua: NEGATIVE
Nitrite: NEGATIVE
Protein, ur: NEGATIVE mg/dL
Specific Gravity, Urine: 1.025 (ref 1.005–1.030)
pH: 6 (ref 5.0–8.0)

## 2019-11-07 LAB — TROPONIN I (HIGH SENSITIVITY)
Troponin I (High Sensitivity): 32 ng/L — ABNORMAL HIGH (ref <18)
Troponin I (High Sensitivity): 35 ng/L — ABNORMAL HIGH (ref <18)

## 2019-11-07 LAB — LIPASE, BLOOD: Lipase: 23 U/L (ref 11–51)

## 2019-11-07 MED ORDER — LACTATED RINGERS IV BOLUS
500.0000 mL | Freq: Once | INTRAVENOUS | Status: AC
  Administered 2019-11-08: 500 mL via INTRAVENOUS

## 2019-11-07 MED ORDER — POTASSIUM CHLORIDE CRYS ER 20 MEQ PO TBCR
40.0000 meq | EXTENDED_RELEASE_TABLET | Freq: Once | ORAL | Status: AC
  Administered 2019-11-08: 40 meq via ORAL
  Filled 2019-11-07: qty 2

## 2019-11-07 NOTE — ED Triage Notes (Signed)
Pt here for constipation. Has not had bowel movement for 7 days.  Tried OTC laxative without relief. Vomited a medication he took for the constipation this AM but has not had any other nausea or vomiting. no fever. no abdominal pain.  Baseline HR at doctor visits in 70s per records, EKG done, no beta blockers, + ekg changes

## 2019-11-07 NOTE — ED Provider Notes (Signed)
[facility] Emergency Department Provider Note  ____________________________________________   First MD Initiated Contact with Patient 11/07/19 2317     (approximate)  I have reviewed the triage vital signs and the nursing notes.   HISTORY  Chief Complaint Constipation   HPI Timothy Ramirez is a 86 y.o. male with a past medical history of HTN, HDL, borderline diabetes, BPH, aortic stenosis, and mitral valve regurg who presents for assessment approximately 5 days of constipation as well as absence of passing flatulence.  Patient states he took an over-the-counter laxative today and vomited up but he does not recall the name of the medication.  He denies any prior similar episodes or other clear alleviating aggravating factors.  Denies any history abdominal surgeries.  He states he otherwise has been in his usual state of health without any recent fevers, chills, cough, nausea, shortness of breath, chest pain, back pain, abdominal pain, scrotal or testicular pain, burning with urination, blood in his urine, rash, extremity pain, or other acute complaints.  He denies illicit drug use or tobacco abuse.  States he only has an alcoholic drink on occasion.  No other acute concerns at this time.         Past Medical History:  Diagnosis Date  . Borderline diabetes   . Hyperlipidemia   . Hypertension     Patient Active Problem List   Diagnosis Date Noted  . Bradycardia 11/08/2019  . Constipation 11/08/2019  . Elevated troponin 11/08/2019  . Diabetes mellitus type 2, uncomplicated (HCC) 11/08/2019  . Hypertension 11/08/2019  . Microhematuria 08/04/2019  . BPH without obstruction/lower urinary tract symptoms 08/04/2019  . Atypical chest pain 05/20/2018  . Shortness of breath 05/20/2018  . Moderate aortic stenosis 05/20/2018  . Nonrheumatic mitral valve regurgitation 05/20/2018  . Essential hypertension 05/20/2018  . Hyperlipidemia 05/20/2018    Past  Surgical History:  Procedure Laterality Date  . APPENDECTOMY    . HERNIA REPAIR    . TONSILLECTOMY      Prior to Admission medications   Medication Sig Start Date End Date Taking? Authorizing Provider  citalopram (CELEXA) 20 MG tablet Take 20 mg by mouth daily. 08/18/19  Yes [provider]  potassium chloride SA (KLOR-CON) 20 MEQ tablet Take 20 mEq by mouth daily. 10/17/19  Yes [provider]  aspirin EC 81 MG tablet Take 1 tablet (81 mg total) by mouth daily. 04/01/18   End, Cristal Deer, MD  atorvastatin (LIPITOR) 20 MG tablet Take 20 mg by mouth every morning.    [provider]  hydrochlorothiazide (HYDRODIURIL) 25 MG tablet Take 25 mg by mouth daily.    [provider]  potassium chloride (KLOR-CON 10) 10 MEQ tablet Take 20 mEq by mouth daily.     [provider]  rivastigmine (EXELON) 1.5 MG capsule Take 1.5 mg by mouth 2 (two) times daily. 08/18/19   [provider]  temazepam (RESTORIL) 15 MG capsule Take 15 mg by mouth at bedtime as needed for sleep.    [provider]    Allergies Patient has no known allergies.  Family History  Problem Relation Age of Onset  . Congestive Heart Failure Mother   . Heart attack Mother 66  . Cirrhosis Father   . COPD Sister     Social History Social History   Tobacco Use  . Smoking status: Never Smoker  . Smokeless tobacco: Never Used  Vaping Use  . Vaping Use: Never used  Substance Use Topics  .  Alcohol use: Yes    Alcohol/week: 2.0 standard drinks    Types: 1 Cans of beer, 1 Shots of liquor per week  . Drug use: No    Review of Systems  Review of Systems  Constitutional: Negative for chills and fever.  HENT: Negative for sore throat.   Eyes: Negative for pain.  Respiratory: Negative for cough and stridor.   Cardiovascular: Negative for chest pain.  Gastrointestinal: Positive for constipation and vomiting.  Skin: Negative for rash.  Neurological: Negative for  seizures, loss of consciousness and headaches.  Psychiatric/Behavioral: Negative for suicidal ideas.  All other systems reviewed and are negative.     ____________________________________________   PHYSICAL EXAM:  VITAL SIGNS: ED Triage Vitals [11/07/19 1544]  Enc Vitals Group     BP (!) 146/58     Pulse Rate (!) 43     Resp 16     Temp 98.4 F (36.9 C)     Temp Source Oral     SpO2 98 %     Weight 195 lb (88.5 kg)     Height 5\' 11"  (1.803 m)     Head Circumference      Peak Flow      Pain Score 0     Pain Loc      Pain Edu?      Excl. in GC?    Vitals:   11/08/19 0100 11/08/19 0345  BP: 123/90 (!) 148/58  Pulse: (!) 45 93  Resp: 19 19  Temp:    SpO2: 96% 98%   Physical Exam Vitals and nursing note reviewed.  Constitutional:      Appearance: He is well-developed.  HENT:     Head: Normocephalic and atraumatic.     Right Ear: External ear normal.     Left Ear: External ear normal.     Nose: Nose normal.  Eyes:     Conjunctiva/sclera: Conjunctivae normal.  Cardiovascular:     Rate and Rhythm: Regular rhythm. Bradycardia present.     Heart sounds: No murmur heard.   Pulmonary:     Effort: Pulmonary effort is normal. No respiratory distress.     Breath sounds: Normal breath sounds.  Abdominal:     General: There is distension.     Palpations: Abdomen is soft.     Tenderness: There is no abdominal tenderness.  Musculoskeletal:     Cervical back: Neck supple.  Skin:    General: Skin is warm and dry.     Capillary Refill: Capillary refill takes less than 2 seconds.  Neurological:     Mental Status: He is alert and oriented to person, place, and time.  Psychiatric:        Mood and Affect: Mood normal.      ____________________________________________   LABS (all labs ordered are listed, but only abnormal results are displayed)  Labs Reviewed  CBC WITH DIFFERENTIAL/PLATELET - Abnormal; Notable for the following components:      Result Value    Hemoglobin 18.1 (*)    MCHC 37.2 (*)    Platelets 148 (*)    All other components within normal limits  COMPREHENSIVE METABOLIC PANEL - Abnormal; Notable for the following components:   Potassium 3.2 (*)    Chloride 97 (*)    Glucose, Bld 127 (*)    BUN 28 (*)    Creatinine, Ser 1.52 (*)    Total Bilirubin 2.6 (*)    GFR calc non Af Amer 41 (*)  GFR calc Af Amer 48 (*)    All other components within normal limits  URINALYSIS, COMPLETE (UACMP) WITH MICROSCOPIC - Abnormal; Notable for the following components:   Color, Urine YELLOW (*)    APPearance CLEAR (*)    All other components within normal limits  TROPONIN I (HIGH SENSITIVITY) - Abnormal; Notable for the following components:   Troponin I (High Sensitivity) 32 (*)    All other components within normal limits  TROPONIN I (HIGH SENSITIVITY) - Abnormal; Notable for the following components:   Troponin I (High Sensitivity) 35 (*)    All other components within normal limits  TROPONIN I (HIGH SENSITIVITY) - Abnormal; Notable for the following components:   Troponin I (High Sensitivity) 46 (*)    All other components within normal limits  SARS CORONAVIRUS 2 BY RT PCR (HOSPITAL ORDER, PERFORMED IN  HOSPITAL LAB)  LIPASE, BLOOD  MAGNESIUM  TSH  HEMOGLOBIN A1C  CBC  CREATININE, SERUM   ____________________________________________  EKG  Sinus bradycardia with a ventricular rate of 47, normal axis, unremarkable intervals, right bundle branch block, T wave inversions in the anterior and lateral leads that are all present on prior EKGs.  No other clear evidence of acute ischemia. ____________________________________________  RADIOLOGY   Official radiology report(s): DG Abdomen 1 View  Result Date: 11/07/2019 CLINICAL DATA:  Constipation EXAM: ABDOMEN - 1 VIEW COMPARISON:  None. FINDINGS: The bowel gas pattern is normal. There is a moderate amount of right colonic stool present. Surgical clips are seen in the right  upper quadrant. No radio-opaque calculi or other significant radiographic abnormality are seen. IMPRESSION: Nonobstructive bowel gas pattern.  Moderate amount colonic stool. Electronically Signed   By: Jonna Clark M.D.   On: 11/07/2019 17:07   CT ABDOMEN PELVIS W CONTRAST  Result Date: 11/08/2019 CLINICAL DATA:  Constipation, vomiting EXAM: CT ABDOMEN AND PELVIS WITH CONTRAST TECHNIQUE: Multidetector CT imaging of the abdomen and pelvis was performed using the standard protocol following bolus administration of intravenous contrast. CONTRAST:  68mL OMNIPAQUE IOHEXOL 300 MG/ML  SOLN COMPARISON:  09/03/2019 FINDINGS: Lower chest: The visualized lung bases are clear bilaterally. Extensive calcification of the aortic valve leaflets is incompletely assessed on this exam. Mild calcification of the mitral valve annulus. Cardiac size within normal limits. No pericardial effusion. Small hiatal hernia. Hepatobiliary: Cholecystectomy has been performed. Mild intrahepatic and moderate extrahepatic biliary ductal dilation appears stable since prior examination and is nonspecific, possibly representing post cholecystectomy change. The liver is otherwise unremarkable. Pancreas: Mildly atrophic, but otherwise unremarkable. Spleen: Unremarkable Adrenals/Urinary Tract: The adrenal glands are unremarkable. The kidneys are normal in size and position. Simple cortical cyst noted within the lower pole of the left kidney. The kidneys are otherwise unremarkable. The bladder is largely decompressed and is unremarkable. Stomach/Bowel: The stomach and small bowel are unremarkable. There is severe descending and sigmoid colonic diverticulosis without superimposed inflammatory change. The large bowel is otherwise unremarkable. Appendix normal. No free intraperitoneal gas or fluid. Vascular/Lymphatic: Moderate aortoiliac atherosclerotic calcification without evidence of aneurysm. There is particularly prominent calcification seen at the  origin of the superior mesenteric artery, however, the degree of stenosis is not well assessed on this non arteriographic study. There is no pathologic adenopathy within the abdomen and pelvis. Reproductive: Mild prostatic enlargement. Seminal vesicles are unremarkable peer Other: Tiny fat containing umbilical and small direct fat containing right inguinal hernias are noted. The rectum is unremarkable. Musculoskeletal: Degenerative changes are seen within the lumbar spine. No lytic or blastic  bone lesion identified. IMPRESSION: Distal colonic diverticulosis without evidence of diverticulitis. Otherwise unremarkable examination of the bowel. No definite radiographic explanation for the patient's reported symptoms. Stable dilation of the a biliary tree, possibly representing post cholecystectomy change. Aortic Atherosclerosis (ICD10-I70.0). Electronically Signed   By: Helyn Numbers MD   On: 11/08/2019 00:44    ____________________________________________   PROCEDURES  Procedure(s) performed (including Critical Care):  .1-3 Lead EKG Interpretation Performed by: Gilles Chiquito, MD Authorized by: Gilles Chiquito, MD     Interpretation: abnormal     ECG rate assessment: bradycardic     Rhythm: sinus rhythm     Ectopy: none     Conduction: abnormal       ____________________________________________   INITIAL IMPRESSION / ASSESSMENT AND PLAN / ED COURSE        Patient presents with [place] to history exam for assessment of constipation for the past 5 days.  Patient is bradycardic otherwise stable vital signs on room air.  Exam as above.  With regard to patient's bradycardia patient denies any lightheadedness, dizziness, chest pain, shortness of breath, or other acute symptoms.  Seems this is asymptomatic bradycardia at this time although patient's troponin is is above reference range and was noted to trend upwards from 32-46.Marland Kitchen  His CMP does show evidence of hyperkalemia and his creatinine is  noted to be 1.5 from 1.42 months ago and 1.21-year ago.  In addition his T bili is 2.6 but there are no other significant electrolyte or metabolic derangements.  CBC, UA, and lipase unremarkable.  TSH is also WNL.  With regard to patient's constipation unclear etiology although he does appear slightly dehydrated and his potassium is slightly below normal which could be contributing to his constipation.  CT abdomen pelvis otherwise shows no acute intra-abdominal pathology including AAA, appendicitis, diverticulitis, cystitis, or other acute process.  Patient was given an enema in the ED and had a very small bowel movement.  While patient denies any acute symptoms given his bradycardia with uptrending troponin and multiple cardiac risk factors I do believe it is reasonable for patient to be obstinate with consult with cardiology later this morning and likely echocardiogram.  We will plan to admit to medicine service.         ____________________________________________   FINAL CLINICAL IMPRESSION(S) / ED DIAGNOSES  Final diagnoses:  Hypokalemia  Constipation, unspecified constipation type  Bradycardia  Troponin I above reference range  RBBB    Medications  enoxaparin (LOVENOX) injection 40 mg (has no administration in time range)  0.9 %  sodium chloride infusion (has no administration in time range)  bisacodyl (DULCOLAX) EC tablet 5 mg (has no administration in time range)  ondansetron (ZOFRAN) tablet 4 mg (has no administration in time range)    Or  ondansetron (ZOFRAN) injection 4 mg (has no administration in time range)  zolpidem (AMBIEN) tablet 5 mg (has no administration in time range)  insulin aspart (novoLOG) injection 0-15 Units (has no administration in time range)  insulin aspart (novoLOG) injection 0-5 Units (has no administration in time range)  lactated ringers bolus 500 mL (0 mLs Intravenous Stopped 11/08/19 0129)  potassium chloride SA (KLOR-CON) CR tablet 40 mEq (40  mEq Oral Given 11/08/19 0129)  iohexol (OMNIPAQUE) 300 MG/ML solution 75 mL (75 mLs Intravenous Contrast Given 11/08/19 0022)  mineral oil enema 1 enema (1 enema Rectal Given 11/08/19 0129)  aspirin chewable tablet 324 mg (324 mg Oral Given 11/08/19 0346)     ED  Discharge Orders    None       Note:  This document was prepared using Dragon voice recognition software and may include unintentional dictation errors.   Gilles Chiquito, MD 11/08/19 [phone]

## 2019-11-08 ENCOUNTER — Other Ambulatory Visit: Payer: Self-pay

## 2019-11-08 ENCOUNTER — Observation Stay (HOSPITAL_BASED_OUTPATIENT_CLINIC_OR_DEPARTMENT_OTHER)
Admit: 2019-11-08 | Discharge: 2019-11-08 | Disposition: A | Discharge status: Home / Self Care | Payer: Medicare Other | Attending: Emergency Medicine | Admitting: Emergency Medicine

## 2019-11-08 ENCOUNTER — Encounter: Payer: Self-pay | Admitting: Radiology

## 2019-11-08 ENCOUNTER — Emergency Department: Payer: Medicare Other

## 2019-11-08 DIAGNOSIS — E876 Hypokalemia: Secondary | ICD-10-CM | POA: Diagnosis not present

## 2019-11-08 DIAGNOSIS — I340 Nonrheumatic mitral (valve) insufficiency: Secondary | ICD-10-CM | POA: Diagnosis not present

## 2019-11-08 DIAGNOSIS — K5900 Constipation, unspecified: Secondary | ICD-10-CM

## 2019-11-08 DIAGNOSIS — I350 Nonrheumatic aortic (valve) stenosis: Secondary | ICD-10-CM | POA: Diagnosis not present

## 2019-11-08 DIAGNOSIS — I10 Essential (primary) hypertension: Secondary | ICD-10-CM

## 2019-11-08 DIAGNOSIS — R778 Other specified abnormalities of plasma proteins: Secondary | ICD-10-CM | POA: Diagnosis not present

## 2019-11-08 DIAGNOSIS — R001 Bradycardia, unspecified: Secondary | ICD-10-CM | POA: Diagnosis not present

## 2019-11-08 DIAGNOSIS — E119 Type 2 diabetes mellitus without complications: Secondary | ICD-10-CM

## 2019-11-08 LAB — BASIC METABOLIC PANEL
Anion gap: 8 (ref 5–15)
BUN: 27 mg/dL — ABNORMAL HIGH (ref 8–23)
CO2: 28 mmol/L (ref 22–32)
Calcium: 8.3 mg/dL — ABNORMAL LOW (ref 8.9–10.3)
Chloride: 101 mmol/L (ref 98–111)
Creatinine, Ser: 1.38 mg/dL — ABNORMAL HIGH (ref 0.61–1.24)
GFR calc Af Amer: 54 mL/min — ABNORMAL LOW (ref >60)
GFR calc non Af Amer: 47 mL/min — ABNORMAL LOW (ref >60)
Glucose, Bld: 129 mg/dL — ABNORMAL HIGH (ref 70–99)
Potassium: 3.3 mmol/L — ABNORMAL LOW (ref 3.5–5.1)
Sodium: 137 mmol/L (ref 135–145)

## 2019-11-08 LAB — CBC
HCT: 43.1 % (ref 39.0–52.0)
Hemoglobin: 15.6 g/dL (ref 13.0–17.0)
MCH: 32.8 pg (ref 26.0–34.0)
MCHC: 36.2 g/dL — ABNORMAL HIGH (ref 30.0–36.0)
MCV: 90.5 fL (ref 80.0–100.0)
Platelets: 121 10*3/uL — ABNORMAL LOW (ref 150–400)
RBC: 4.76 MIL/uL (ref 4.22–5.81)
RDW: 14.6 % (ref 11.5–15.5)
WBC: 6.6 10*3/uL (ref 4.0–10.5)
nRBC: 0 % (ref 0.0–0.2)

## 2019-11-08 LAB — GLUCOSE, CAPILLARY
Glucose-Capillary: 121 mg/dL — ABNORMAL HIGH (ref 70–99)
Glucose-Capillary: 109 mg/dL — ABNORMAL HIGH (ref 70–99)
Glucose-Capillary: 120 mg/dL — ABNORMAL HIGH (ref 70–99)
Glucose-Capillary: 105 mg/dL — ABNORMAL HIGH (ref 70–99)

## 2019-11-08 LAB — ECHOCARDIOGRAM COMPLETE BUBBLE STUDY
AR max vel: 1.39 cm2
AV Area VTI: 1.36 cm2
AV Area mean vel: 1.23 cm2
AV Mean grad: 28.3 mmHg
AV Peak grad: 40.5 mmHg
Ao pk vel: 3.18 m/s
Area-P 1/2: 2.29 cm2
S' Lateral: 2.61 cm

## 2019-11-08 LAB — SARS CORONAVIRUS 2 BY RT PCR (HOSPITAL ORDER, PERFORMED IN ~~LOC~~ HOSPITAL LAB): SARS Coronavirus 2: NEGATIVE

## 2019-11-08 LAB — MAGNESIUM: Magnesium: 2.2 mg/dL (ref 1.7–2.4)

## 2019-11-08 LAB — TROPONIN I (HIGH SENSITIVITY): Troponin I (High Sensitivity): 46 ng/L — ABNORMAL HIGH (ref <18)

## 2019-11-08 LAB — HEMOGLOBIN A1C
Hgb A1c MFr Bld: 5.9 % — ABNORMAL HIGH (ref 4.8–5.6)
Mean Plasma Glucose: 122.63 mg/dL

## 2019-11-08 LAB — TSH: TSH: 2.553 u[IU]/mL (ref 0.350–4.500)

## 2019-11-08 MED ORDER — BISACODYL 5 MG PO TBEC
5.0000 mg | DELAYED_RELEASE_TABLET | Freq: Every day | ORAL | Status: DC | PRN
  Administered 2019-11-09: 5 mg via ORAL
  Filled 2019-11-08: qty 1

## 2019-11-08 MED ORDER — ZOLPIDEM TARTRATE 5 MG PO TABS
5.0000 mg | ORAL_TABLET | Freq: Every evening | ORAL | Status: DC | PRN
  Administered 2019-11-08: 5 mg via ORAL
  Filled 2019-11-08: qty 1

## 2019-11-08 MED ORDER — IOHEXOL 300 MG/ML  SOLN
75.0000 mL | Freq: Once | INTRAMUSCULAR | Status: AC | PRN
  Administered 2019-11-08: 75 mL via INTRAVENOUS

## 2019-11-08 MED ORDER — MINERAL OIL RE ENEM
1.0000 | ENEMA | Freq: Once | RECTAL | Status: AC
  Administered 2019-11-08: 1 via RECTAL

## 2019-11-08 MED ORDER — ATORVASTATIN CALCIUM 20 MG PO TABS
20.0000 mg | ORAL_TABLET | Freq: Every morning | ORAL | Status: DC
  Administered 2019-11-09: 20 mg via ORAL
  Filled 2019-11-08: qty 1

## 2019-11-08 MED ORDER — CITALOPRAM HYDROBROMIDE 20 MG PO TABS
20.0000 mg | ORAL_TABLET | Freq: Every day | ORAL | Status: DC
  Administered 2019-11-09: 20 mg via ORAL
  Filled 2019-11-08: qty 1

## 2019-11-08 MED ORDER — POTASSIUM CHLORIDE CRYS ER 20 MEQ PO TBCR
40.0000 meq | EXTENDED_RELEASE_TABLET | Freq: Once | ORAL | Status: AC
  Administered 2019-11-08: 40 meq via ORAL
  Filled 2019-11-08: qty 2

## 2019-11-08 MED ORDER — TEMAZEPAM 15 MG PO CAPS
30.0000 mg | ORAL_CAPSULE | Freq: Every day | ORAL | Status: DC
  Administered 2019-11-08: 30 mg via ORAL
  Filled 2019-11-08: qty 2

## 2019-11-08 MED ORDER — RIVASTIGMINE TARTRATE 1.5 MG PO CAPS
1.5000 mg | ORAL_CAPSULE | Freq: Two times a day (BID) | ORAL | Status: DC
  Filled 2019-11-08 (×2): qty 1

## 2019-11-08 MED ORDER — ENOXAPARIN SODIUM 40 MG/0.4ML ~~LOC~~ SOLN
40.0000 mg | SUBCUTANEOUS | Status: DC
  Administered 2019-11-08: 40 mg via SUBCUTANEOUS
  Filled 2019-11-08 (×2): qty 0.4

## 2019-11-08 MED ORDER — ONDANSETRON HCL 4 MG/2ML IJ SOLN: 4.0000 mg | Freq: Four times a day (QID) | INTRAMUSCULAR | Status: DC | PRN

## 2019-11-08 MED ORDER — ASPIRIN 81 MG PO CHEW
324.0000 mg | CHEWABLE_TABLET | Freq: Once | ORAL | Status: AC
  Administered 2019-11-08: 324 mg via ORAL
  Filled 2019-11-08: qty 4

## 2019-11-08 MED ORDER — INSULIN ASPART 100 UNIT/ML ~~LOC~~ SOLN
0.0000 [IU] | Freq: Three times a day (TID) | SUBCUTANEOUS | Status: DC
  Administered 2019-11-08: 2 [IU] via SUBCUTANEOUS
  Filled 2019-11-08: qty 1

## 2019-11-08 MED ORDER — SODIUM CHLORIDE 0.9 % IV SOLN: INTRAVENOUS | Status: DC

## 2019-11-08 MED ORDER — POTASSIUM CHLORIDE CRYS ER 20 MEQ PO TBCR
20.0000 meq | EXTENDED_RELEASE_TABLET | Freq: Every day | ORAL | Status: DC
  Administered 2019-11-09: 20 meq via ORAL
  Filled 2019-11-08: qty 1

## 2019-11-08 MED ORDER — ASPIRIN 325 MG PO TABS: 325.0000 mg | ORAL_TABLET | Freq: Once | ORAL | Status: DC

## 2019-11-08 MED ORDER — ONDANSETRON HCL 4 MG PO TABS: 4.0000 mg | ORAL_TABLET | Freq: Four times a day (QID) | ORAL | Status: DC | PRN

## 2019-11-08 MED ORDER — INSULIN ASPART 100 UNIT/ML ~~LOC~~ SOLN: 0.0000 [IU] | Freq: Every day | SUBCUTANEOUS | Status: DC

## 2019-11-08 NOTE — ED Notes (Signed)
Pt given meal tray.

## 2019-11-08 NOTE — Progress Notes (Signed)
PT Cancellation Note  Patient Details Name: Timothy Ramirez MRN: [id number] DOB: Jan 11, 1934   Cancelled Treatment:    Reason Eval/Treat Not Completed: Medical issues which prohibited therapy.Patient having cardiology consult in the a.m.   [address] [place], [place] DPT 11/08/2019, 4:05 PM

## 2019-11-08 NOTE — Consult Note (Signed)
Cardiology Consultation:   Patient ID: Timothy Ramirez; [id number]; 20-Apr-1933   Admit date: 11/07/2019 Date of Consult: 11/08/2019  Primary Care Provider: Jerl MinaHedrick, James, MD Primary Cardiologist: End Primary Electrophysiologist:  None   Patient Profile:   Timothy Ramirez is a 86 y.o. male with a hx of moderate aortic stenosis, hypertension, hyperlipidemia who is being seen today for the evaluation of bradycardia at the request of Dr. Jacqulyn BathPahwani.  History of Present Illness:   Timothy Ramirez was initially evaluated by cardiology in 03/2018 following an ED visit for chest and left arm pain that has been intermittent for several years. At that time, ED work-up was unrevealing. Echo showed an EF greater than 55%, grade 1 diastolic dysfunction, normal RV systolic function and ventricular cavity size, mild left atrial enlargement, calcified aortic valve with moderate stenosis with a mean gradient of 23 mmHg, and mild to moderate mitral vegetation. When compared to prior echo performed at outside office in 2018 his EF remains unchanged and his aortic valve disease at progressed from mild to now moderate. He underwent exercise MPI in 03/2018 which was low risk and showed no significant ischemia or scar. Limited exercise capacity was noted secondary to significant shortness of breath. Patient had frequent PVCs during recovery. He was last seen in 02/2019 doing well from a cardiac perspective.  [facility]19 with a chief complaint of constipation. He is uncertain when his last bowel movement was. CT abdomen pelvis showed distal colonic diverticulosis without evidence of diverticulitis and was otherwise unremarkable. He was noted to be bradycardic with heart rates in the 40s bpm and asymptomatic. High-sensitivity troponin was checked with an initial value of 32 and delta of 35. Covid negative. Potassium 3.2 trending to 3.3. He is not on any AV nodal blocking agents. On telemetry his heart rate has  ranged from the 40s to 70s bpm intermittently. Currently he is without complaints.    Past Medical History:  Diagnosis Date  . Borderline diabetes   . Hyperlipidemia   . Hypertension     Past Surgical History:  Procedure Laterality Date  . APPENDECTOMY    . HERNIA REPAIR    . TONSILLECTOMY       Home Meds: Prior to Admission medications   Medication Sig Start Date End Date Taking? Authorizing Provider  atorvastatin (LIPITOR) 20 MG tablet Take 20 mg by mouth every morning.   Yes [provider]  citalopram (CELEXA) 20 MG tablet Take 20 mg by mouth daily. 08/18/19  Yes [provider]  hydrochlorothiazide (HYDRODIURIL) 25 MG tablet Take 25 mg by mouth daily.   Yes [provider]  potassium chloride SA (KLOR-CON) 20 MEQ tablet Take 20 mEq by mouth daily. 10/17/19  Yes [provider]  rivastigmine (EXELON) 1.5 MG capsule Take 1.5 mg by mouth 2 (two) times daily. 08/18/19  Yes [provider]  temazepam (RESTORIL) 30 MG capsule Take 30 mg by mouth at bedtime.    Yes [provider]    Inpatient Medications: Scheduled Meds: . enoxaparin (LOVENOX) injection  40 mg Subcutaneous Q24H  . insulin aspart  0-15 Units Subcutaneous TID WC  . insulin aspart  0-5 Units Subcutaneous QHS   Continuous Infusions: . sodium chloride 75 mL/hr at 11/08/19 0513   PRN Meds: bisacodyl, ondansetron **OR** ondansetron (ZOFRAN) IV, zolpidem  Allergies:  No Known Allergies  Social History:   Social History   Socioeconomic History  . Marital status: Widowed  Spouse name: Not on file  . Number of children: Not on file  . Years of education: Not on file  . Highest education level: Not on file  Occupational History  . Not on file  Tobacco Use  . Smoking status: Never Smoker  . Smokeless tobacco: Never Used  Vaping Use  . Vaping Use: Never used  Substance and Sexual Activity  . Alcohol use: Yes    Alcohol/week: 2.0 standard drinks     Types: 1 Cans of beer, 1 Shots of liquor per week  . Drug use: No  . Sexual activity: Not Currently  Other Topics Concern  . Not on file  Social History Narrative  . Not on file   Social Determinants of Health   Financial Resource Strain:   . Difficulty of Paying Living Expenses: Not on file  Food Insecurity:   . Worried About Programme researcher, broadcasting/film/video in the Last Year: Not on file  . Ran Out of Food in the Last Year: Not on file  Transportation Needs:   . Lack of Transportation (Medical): Not on file  . Lack of Transportation (Non-Medical): Not on file  Physical Activity:   . Days of Exercise per Week: Not on file  . Minutes of Exercise per Session: Not on file  Stress:   . Feeling of Stress : Not on file  Social Connections:   . Frequency of Communication with Friends and Family: Not on file  . Frequency of Social Gatherings with Friends and Family: Not on file  . Attends Religious Services: Not on file  . Active Member of Clubs or Organizations: Not on file  . Attends Banker Meetings: Not on file  . Marital Status: Not on file  Intimate Partner Violence:   . Fear of Current or Ex-Partner: Not on file  . Emotionally Abused: Not on file  . Physically Abused: Not on file  . Sexually Abused: Not on file     Family History:   Family History  Problem Relation Age of Onset  . Congestive Heart Failure Mother   . Heart attack Mother 89  . Cirrhosis Father   . COPD Sister     ROS:  Review of Systems  Constitutional: Negative for chills, diaphoresis, fever, malaise/fatigue and weight loss.  HENT: Negative for congestion.   Eyes: Negative for discharge and redness.  Respiratory: Negative for cough, hemoptysis, sputum production, shortness of breath and wheezing.   Cardiovascular: Negative for chest pain, palpitations, orthopnea, claudication, leg swelling and PND.  Gastrointestinal: Positive for constipation. Negative for abdominal pain, blood in stool, diarrhea,  heartburn, melena, nausea and vomiting.  Genitourinary: Negative for hematuria.  Musculoskeletal: Negative for falls and myalgias.  Skin: Negative for rash.  Neurological: Negative for dizziness, tingling, tremors, sensory change, speech change, focal weakness, loss of consciousness and weakness.  Endo/Heme/Allergies: Does not bruise/bleed easily.  Psychiatric/Behavioral: Negative for substance abuse. The patient is not nervous/anxious.   All other systems reviewed and are negative.     Physical Exam/Data:   Vitals:   11/08/19 0530 11/08/19 0645 11/08/19 0745 11/08/19 1000  BP: 131/63   (!) 166/87  Pulse: (!) 46 (!) 45 98 76  Resp: 19 19 16 16   Temp:      TempSrc:      SpO2: 97% 94% 96% 99%  Weight:      Height:        Intake/Output Summary (Last 24 hours) at 11/08/2019 1144 Last data filed at 11/08/2019 0129  Gross per 24 hour  Intake 500 ml  Output --  Net 500 ml   Filed Weights   11/07/19 1544  Weight: 88.5 kg   Body mass index is 27.2 kg/m.   Physical Exam: General: Well developed, well nourished, in no acute distress. Head: Normocephalic, atraumatic, sclera non-icteric, no xanthomas, nares without discharge.  Neck: Negative for carotid bruits. JVD not elevated. Lungs: Clear bilaterally to auscultation without wheezes, rales, or rhonchi. Breathing is unlabored. Heart: Bradycardic with S1 S2. II/VI systolic murmur RUSB, no rubs, or gallops appreciated. Abdomen: Soft, non-tender, non-distended with normoactive bowel sounds. No hepatomegaly. No rebound/guarding. No obvious abdominal masses. Msk:  Strength and tone appear normal for age. Extremities: No clubbing or cyanosis. No edema. Distal pedal pulses are 2+ and equal bilaterally. Neuro: Alert and oriented X 3. No facial asymmetry. No focal deficit. Moves all extremities spontaneously. Psych:  Responds to questions appropriately with a normal affect.   EKG:  The EKG was personally reviewed and demonstrates: sinus  bradycardia, 47 bpm, bifascicular block. Repeat EKG this morning - NSR, 76 bpm, bifascicular block Telemetry:  Telemetry was personally reviewed and demonstrates: SR with rates ranging from the 40s to 70s bpm intermittently   Weights: Filed Weights   11/07/19 1544  Weight: 88.5 kg    Relevant CV Studies:  2D echo 03/2018: 1. The cavity size was normal. Normal LV systolic function, EF >55  2. There is mild to moderately increased left ventricular wall thickness.  Grade I diastolic dysfunction  3. The right ventricle has normal systolic function. The cavity was  normal. There is no increase in right ventricular wall thickness.  4. Left atrial size was mildly dilated.  5. Severe calcifcation of the aortic valve. Moderate stenosis of the  aortic valve.AV Mean Grad: 23.0 mmHg  6. Mitral valve regurgitation is mild to moderate by color flow Doppler.  7. Right atrial pressure is estimated at 10 mmHg.  __________  Eugenie Birks MPI 03/2018:  No T wave inversion was noted during stress.  The study is normal.  This is a low risk study.  The left ventricular ejection fraction is normal (55-65%).  The patient exercised for 4 minutes and 45 seconds and was limited by severe shortness of breath. Frequent PVCs noted in recovery.  Laboratory Data:  Chemistry Recent Labs  Lab 11/07/19 1555 11/08/19 0515  NA 137 137  K 3.2* 3.3*  CL 97* 101  CO2 25 28  GLUCOSE 127* 129*  BUN 28* 27*  CREATININE 1.52* 1.38*  CALCIUM 9.0 8.3*  GFRNONAA 41* 47*  GFRAA 48* 54*  ANIONGAP 15 8    Recent Labs  Lab 11/07/19 1555  PROT 7.4  ALBUMIN 3.9  AST 25  ALT 18  ALKPHOS 118  BILITOT 2.6*   Hematology Recent Labs  Lab 11/07/19 1555 11/08/19 0515  WBC 7.4 6.6  RBC 5.50 4.76  HGB 18.1* 15.6  HCT 48.6 43.1  MCV 88.4 90.5  MCH 32.9 32.8  MCHC 37.2* 36.2*  RDW 14.6 14.6  PLT 148* 121*   Cardiac EnzymesNo results for input(s): TROPONINI in the last 168 hours. No results for  input(s): TROPIPOC in the last 168 hours.  BNPNo results for input(s): BNP, PROBNP in the last 168 hours.  DDimer No results for input(s): DDIMER in the last 168 hours.  Radiology/Studies:  DG Abdomen 1 View  Result Date: 11/07/2019 IMPRESSION: Nonobstructive bowel gas pattern.  Moderate amount colonic stool. Electronically Signed   By: Heywood Bene.D.  On: 11/07/2019 17:07   CT ABDOMEN PELVIS W CONTRAST  Result Date: 11/08/2019 IMPRESSION: Distal colonic diverticulosis without evidence of diverticulitis. Otherwise unremarkable examination of the bowel. No definite radiographic explanation for the patient's reported symptoms. Stable dilation of the a biliary tree, possibly representing post cholecystectomy change. Aortic Atherosclerosis (ICD10-I70.0). Electronically Signed   By: Helyn Numbers MD   On: 11/08/2019 00:44    Assessment and Plan:   1. Asymptomatic bradycardia with known bifascicular block: -Sinus bradycardia with rates in the 40s bpm upon presentation, asymptomatic with heart rates improving to the 70s bpm on follow up EKG -On telemetry, he continues to have episodes of sinus rhythm in the 70s bpm with transition of rates in to the 40s bpm -Asymptomatic  -Ambulate to assess chronotropic competence  -Not on AV nodal blocking medications -Possibly in the setting of hypokalemia, recommend repletion to goal 4.0 and continued monitoring on telemetry -May need Zio patch prior to discharge  -TSH normal -No indication for emergent PPM at this time -BP stable, no indication for atropine or dopamine   2. Hypokalemia: -Likely in the setting of HCTZ -Potasium of 3.2 upon arrival with a current value of 3.3 -Replete to goal 4.0  3. Elevated HS-Tn: -Patient presented with constipation  -No symptoms of angina  -Minimally elevated and flat trending  -Not consistent with ACS -Recent Lexiscan MPI without ischemia  -Echo pending -No indication for heparin gtt  4.  AKI: -Improving with hydration  -Per IM  5. Moderate aortic stenosis: -Echo pending  6. HTN: -Blood pressure has been reasonably well controlled for the most part with most recent reading in the 130s systolic in the room   7. HLD: -PTA Lipitor   8. Constipation: -Reason for presentation -He does not recall when his last BM was -CT abdomen pelvis not acute  -Per IM    For questions or updates, please contact [facility] Please consult [email or web address] for contact info under Cardiology/STEMI.   Signed, Eula Listen, PA-C [facility] Pager: [phone] 11/08/2019, 11:44 AM

## 2019-11-08 NOTE — H&P (Signed)
History and Physical    Timothy Ramirez:[id number] DOB: 11/13/1933 DOA: 11/07/2019  PCP: Jerl Mina, MD   Patient coming from: Home  I have personally briefly reviewed patient's old medical records in [facility]  Chief Complaint: Constipation  HPI: Timothy Ramirez is a 86 y.o. male with medical history significant for HTN, prediabetes, hypertension and moderate aortic stenosis who presented initially to the emergency room because of constipation.  He underwent an extensive evaluation in the emergency room with abdominal series and subsequently CT abdomen and pelvis that ruled out obstruction however during work-up he was noted to be bradycardic in the 40s with T wave inversion in lateral leads and hospitalist consulted for observation.  Patient is not on any rate control agents.  He denies chest pain or shortness of breath or palpitations.  Denies lightheadedness or dizziness.  Denies cough, shortness of breath fever or chills or dysuria.  Otherwise feels like he is in usual state of health. ED Course: Vitals in the ER within normal limits.  Troponin 35>>46, creatinine 1.52, slightly more elevated than baseline of 1.4 a couple months prior.  Urinalysis unremarkable EKG: My independent interpretation : Sinus bradycardia 47 with T wave inversion in lateral leads unchanged from priors.  No acute ST-T wave changes.  On cardiac monitor at rest heart rate as low as 40.  With movement will go to mid 60s Due to concern for bradycardia, hospitalist consulted for observation  Review of Systems: As per HPI otherwise all other systems on review of systems negative.    Past Medical History:  Diagnosis Date  . Borderline diabetes   . Hyperlipidemia   . Hypertension     Past Surgical History:  Procedure Laterality Date  . APPENDECTOMY    . HERNIA REPAIR    . TONSILLECTOMY       reports that he has never smoked. He has never used smokeless tobacco. He reports current alcohol use of  about 2.0 standard drinks of alcohol per week. He reports that he does not use drugs.  No Known Allergies  Family History  Problem Relation Age of Onset  . Congestive Heart Failure Mother   . Heart attack Mother 42  . Cirrhosis Father   . COPD Sister       Prior to Admission medications   Medication Sig Start Date End Date Taking? Authorizing Provider  citalopram (CELEXA) 20 MG tablet Take 20 mg by mouth daily. 08/18/19  Yes [provider]  potassium chloride SA (KLOR-CON) 20 MEQ tablet Take 20 mEq by mouth daily. 10/17/19  Yes [provider]  aspirin EC 81 MG tablet Take 1 tablet (81 mg total) by mouth daily. 04/01/18   End, Cristal Deer, MD  atorvastatin (LIPITOR) 20 MG tablet Take 20 mg by mouth every morning.    [provider]  hydrochlorothiazide (HYDRODIURIL) 25 MG tablet Take 25 mg by mouth daily.    [provider]  potassium chloride (KLOR-CON 10) 10 MEQ tablet Take 20 mEq by mouth daily.     [provider]  rivastigmine (EXELON) 1.5 MG capsule Take 1.5 mg by mouth 2 (two) times daily. 08/18/19   [provider]  temazepam (RESTORIL) 15 MG capsule Take 15 mg by mouth at bedtime as needed for sleep.    [provider]    Physical Exam: Vitals:   11/07/19 2122 11/08/19 0040 11/08/19 0100 11/08/19 0345  BP: (!) 172/55 (!) 137/55 123/90 (!) 148/58  Pulse: (!) 51 Marland Kitchen)  47 (!) 45 93  Resp: 18 16 19 19   Temp: 97.9 F (36.6 C)     TempSrc: Oral     SpO2: 98% 98% 96% 98%  Weight:      Height:         Vitals:   11/07/19 2122 11/08/19 0040 11/08/19 0100 11/08/19 0345  BP: (!) 172/55 (!) 137/55 123/90 (!) 148/58  Pulse: (!) 51 (!) 47 (!) 45 93  Resp: 18 16 19 19   Temp: 97.9 F (36.6 C)     TempSrc: Oral     SpO2: 98% 98% 96% 98%  Weight:      Height:          Constitutional: Alert and oriented x 3 . Not in any apparent distress HEENT:      Head: Normocephalic and atraumatic.         Eyes: PERLA, EOMI,  Conjunctivae are normal. Sclera is non-icteric.       Mouth/Throat: Mucous membranes are moist.       Neck: Supple with no signs of meningismus. Cardiovascular: Regular rate and rhythm. No murmurs, gallops, or rubs. 2+ symmetrical distal pulses are present . No JVD. No LE edema Respiratory: Respiratory effort normal .Lungs sounds clear bilaterally. No wheezes, crackles, or rhonchi.  Gastrointestinal: Soft, non tender, and mildly distended with positive bowel sounds. No rebound or guarding. Genitourinary: No CVA tenderness. Musculoskeletal: Nontender with normal range of motion in all extremities. No cyanosis, or erythema of extremities. Neurologic:  Face is symmetric. Moving all extremities. No gross focal neurologic deficits . Skin: Skin is warm, dry.  No rash or ulcers Psychiatric: Mood and affect are normal    Labs on Admission: I have personally reviewed following labs and imaging studies  CBC: Recent Labs  Lab 11/07/19 1555  WBC 7.4  NEUTROABS 4.4  HGB 18.1*  HCT 48.6  MCV 88.4  PLT 148*   Basic Metabolic Panel: Recent Labs  Lab 11/07/19 1555 11/07/19 2129  NA 137  --   K 3.2*  --   CL 97*  --   CO2 25  --   GLUCOSE 127*  --   BUN 28*  --   CREATININE 1.52*  --   CALCIUM 9.0  --   MG  --  2.2   GFR: Estimated Creatinine Clearance: 38.5 mL/min (A) (by C-G formula based on SCr of 1.52 mg/dL (H)). Liver Function Tests: Recent Labs  Lab 11/07/19 1555  AST 25  ALT 18  ALKPHOS 118  BILITOT 2.6*  PROT 7.4  ALBUMIN 3.9   Recent Labs  Lab 11/07/19 1555  LIPASE 23   No results for input(s): AMMONIA in the last 168 hours. Coagulation Profile: No results for input(s): INR, PROTIME in the last 168 hours. Cardiac Enzymes: No results for input(s): CKTOTAL, CKMB, CKMBINDEX, TROPONINI in the last 168 hours. BNP (last 3 results) No results for input(s): PROBNP in the last 8760 hours. HbA1C: No results for input(s): HGBA1C in the last 72 hours. CBG: No results  for input(s): GLUCAP in the last 168 hours. Lipid Profile: No results for input(s): CHOL, HDL, LDLCALC, TRIG, CHOLHDL, LDLDIRECT in the last 72 hours. Thyroid Function Tests: Recent Labs    11/07/19 2129  TSH 2.553   Anemia Panel: No results for input(s): VITAMINB12, FOLATE, FERRITIN, TIBC, IRON, RETICCTPCT in the last 72 hours. Urine analysis:    Component Value Date/Time   COLORURINE YELLOW (A) 11/07/2019 1555   APPEARANCEUR CLEAR (A) 11/07/2019 1555  APPEARANCEUR Hazy (A) 09/08/2019 0948   LABSPEC 1.025 11/07/2019 1555   PHURINE 6.0 11/07/2019 1555   GLUCOSEU NEGATIVE 11/07/2019 1555   HGBUR NEGATIVE 11/07/2019 1555   BILIRUBINUR NEGATIVE 11/07/2019 1555   BILIRUBINUR Negative 09/08/2019 0948   KETONESUR NEGATIVE 11/07/2019 1555   PROTEINUR NEGATIVE 11/07/2019 1555   NITRITE NEGATIVE 11/07/2019 1555   LEUKOCYTESUR NEGATIVE 11/07/2019 1555    Radiological Exams on Admission: DG Abdomen 1 View  Result Date: 11/07/2019 CLINICAL DATA:  Constipation EXAM: ABDOMEN - 1 VIEW COMPARISON:  None. FINDINGS: The bowel gas pattern is normal. There is a moderate amount of right colonic stool present. Surgical clips are seen in the right upper quadrant. No radio-opaque calculi or other significant radiographic abnormality are seen. IMPRESSION: Nonobstructive bowel gas pattern.  Moderate amount colonic stool. Electronically Signed   By: Jonna ClarkBindu  Avutu M.D.   On: 11/07/2019 17:07   CT ABDOMEN PELVIS W CONTRAST  Result Date: 11/08/2019 CLINICAL DATA:  Constipation, vomiting EXAM: CT ABDOMEN AND PELVIS WITH CONTRAST TECHNIQUE: Multidetector CT imaging of the abdomen and pelvis was performed using the standard protocol following bolus administration of intravenous contrast. CONTRAST:  75mL OMNIPAQUE IOHEXOL 300 MG/ML  SOLN COMPARISON:  09/03/2019 FINDINGS: Lower chest: The visualized lung bases are clear bilaterally. Extensive calcification of the aortic valve leaflets is incompletely assessed on  this exam. Mild calcification of the mitral valve annulus. Cardiac size within normal limits. No pericardial effusion. Small hiatal hernia. Hepatobiliary: Cholecystectomy has been performed. Mild intrahepatic and moderate extrahepatic biliary ductal dilation appears stable since prior examination and is nonspecific, possibly representing post cholecystectomy change. The liver is otherwise unremarkable. Pancreas: Mildly atrophic, but otherwise unremarkable. Spleen: Unremarkable Adrenals/Urinary Tract: The adrenal glands are unremarkable. The kidneys are normal in size and position. Simple cortical cyst noted within the lower pole of the left kidney. The kidneys are otherwise unremarkable. The bladder is largely decompressed and is unremarkable. Stomach/Bowel: The stomach and small bowel are unremarkable. There is severe descending and sigmoid colonic diverticulosis without superimposed inflammatory change. The large bowel is otherwise unremarkable. Appendix normal. No free intraperitoneal gas or fluid. Vascular/Lymphatic: Moderate aortoiliac atherosclerotic calcification without evidence of aneurysm. There is particularly prominent calcification seen at the origin of the superior mesenteric artery, however, the degree of stenosis is not well assessed on this non arteriographic study. There is no pathologic adenopathy within the abdomen and pelvis. Reproductive: Mild prostatic enlargement. Seminal vesicles are unremarkable peer Other: Tiny fat containing umbilical and small direct fat containing right inguinal hernias are noted. The rectum is unremarkable. Musculoskeletal: Degenerative changes are seen within the lumbar spine. No lytic or blastic bone lesion identified. IMPRESSION: Distal colonic diverticulosis without evidence of diverticulitis. Otherwise unremarkable examination of the bowel. No definite radiographic explanation for the patient's reported symptoms. Stable dilation of the a biliary tree, possibly  representing post cholecystectomy change. Aortic Atherosclerosis (ICD10-I70.0). Electronically Signed   By: Helyn NumbersAshesh  Parikh MD   On: 11/08/2019 00:44     Assessment/Plan [age] year old male with HTN, , prediabetes/diet-controlled diabetes, hypertension and moderate aortic stenosis who presented initially for constipation, ruled out for intestinal obstruction but with incidental finding of bradycardia    Bradycardia, asymptomatic -Patient with bradycardia in the 40s not on rate control agents -Observation on telemetry -Consider cardiology consult in the a.m.    Elevated troponin -Mild upward trending troponin but no acute EKG changes and no complaints of chest pain.  ACS not suspected -Continue to trend to peak    Constipation -As needed  laxatives -Gentle IV hydration.  Patient vomited x1    Moderate aortic stenosis -No acute disease suspected    Essential hypertension -Continue home meds  Diet controlled diabetes mellitus type 2, uncomplicated (HCC) -Sliding scale insulin coverage    DVT prophylaxis: Lovenox  Code Status: full code  Family Communication:  none  Disposition Plan: Back to previous home environment Consults called: none  Status:obs    Andris Baumann MD [facility]     11/08/2019, 3:51 AM

## 2019-11-08 NOTE — ED Notes (Signed)
Patient transported to CT 

## 2019-11-08 NOTE — ED Notes (Signed)
Pt has had no results from enema yet; he stated he went and sat on the toilet, nothing came out, very little liquid from the enema came out.

## 2019-11-08 NOTE — Progress Notes (Signed)
86 year old gentleman who initially presented to ED with a complaint of constipation.  Was ruled out of any intestinal or bowel obstruction with CT abdomen.  Was incidentally found to have bradycardia.  Patient seen and examined in the ED this morning.  He denied having any chest pain or shortness of breath.  Still has constipation but no abdominal pain.  Complained of some weakness.  On my evaluation, patient continued to have bradycardia and tachycardia with irregular rhythm.  I consulted cardiology.  Per cardiology note, his asymptomatic bradycardia is being attributable to possible hypokalemia.  Potassium is 3.3.  It has been replaced already.  Slightly elevated troponin but flat without having any ACS symptoms does not indicate ACS.  Also came in with acute kidney injury.  Creatinine has improved a little bit up to 1.38.  Needs more IV hydration.  Echo pending.  Per cardiology, may need Zio patch before discharge.  Continue gentle hydration with normal saline at 75 cc/h.  Repeat labs in the morning.  Will consult PT OT to assess strength.

## 2019-11-08 NOTE — Progress Notes (Signed)
*  PRELIMINARY RESULTS* Echocardiogram 2D Echocardiogram has been performed.  Timothy Ramirez 11/08/2019, 1:50 PM

## 2019-11-09 ENCOUNTER — Observation Stay (INDEPENDENT_AMBULATORY_CARE_PROVIDER_SITE_OTHER): Payer: Medicare Other

## 2019-11-09 ENCOUNTER — Telehealth: Payer: Self-pay | Admitting: *Deleted

## 2019-11-09 DIAGNOSIS — I441 Atrioventricular block, second degree: Secondary | ICD-10-CM | POA: Diagnosis not present

## 2019-11-09 DIAGNOSIS — R001 Bradycardia, unspecified: Secondary | ICD-10-CM

## 2019-11-09 DIAGNOSIS — R778 Other specified abnormalities of plasma proteins: Secondary | ICD-10-CM | POA: Diagnosis not present

## 2019-11-09 LAB — BASIC METABOLIC PANEL
Anion gap: 9 (ref 5–15)
BUN: 16 mg/dL (ref 8–23)
CO2: 25 mmol/L (ref 22–32)
Calcium: 8.1 mg/dL — ABNORMAL LOW (ref 8.9–10.3)
Chloride: 103 mmol/L (ref 98–111)
Creatinine, Ser: 1.1 mg/dL (ref 0.61–1.24)
GFR calc Af Amer: >60 mL/min (ref >60)
GFR calc non Af Amer: >60 mL/min (ref >60)
Glucose, Bld: 134 mg/dL — ABNORMAL HIGH (ref 70–99)
Potassium: 3.2 mmol/L — ABNORMAL LOW (ref 3.5–5.1)
Sodium: 137 mmol/L (ref 135–145)

## 2019-11-09 LAB — GLUCOSE, CAPILLARY
Glucose-Capillary: 108 mg/dL — ABNORMAL HIGH (ref 70–99)
Glucose-Capillary: 108 mg/dL — ABNORMAL HIGH (ref 70–99)

## 2019-11-09 MED ORDER — POTASSIUM CHLORIDE CRYS ER 20 MEQ PO TBCR
40.0000 meq | EXTENDED_RELEASE_TABLET | ORAL | Status: DC
  Administered 2019-11-09: 40 meq via ORAL
  Filled 2019-11-09: qty 4

## 2019-11-09 NOTE — Telephone Encounter (Signed)
Message from [employer], PA-C :Is someone able to come up and place a real time Zio on this patient today? Dx: bradycardia with 2:1 AV block    Monitor applied to patient ID # [id number]. He verbalized understanding of the instructions. Box and gateway to device left at bedside. Reported to patient's nurse, Tammy, that monitor had been applied.

## 2019-11-09 NOTE — Evaluation (Signed)
Physical Therapy Evaluation Patient Details Name: Timothy Ramirez MRN: [id number] DOB: February 28, 1933 Today's Date: 11/09/2019   History of Present Illness  HPI: Timothy Ramirez is a 86 y.o. male with medical history significant for HTN, prediabetes, hypertension and moderate aortic stenosis who presented initially to the emergency room because of constipation.  He underwent an extensive evaluation in the emergency room with abdominal series and subsequently CT abdomen and pelvis that ruled out obstruction however during work-up he was noted to be bradycardic in the 40s with T wave inversion in lateral leads and hospitalist consulted for observation.  Patient is not on any rate control agents.    Clinical Impression  Pt did well with PT exam and showed no issues with balance, strength, activity tolerance or other PT related concerns. Pt did have variable HR t/o the session; in the low 50s on arrival, rising consistently during ambulation from 70s to 90s, over 100 for 1-2 minutes post ambulation and then variable (as low as the 40s) looking at the tele screen post session. Pt asymptomatic t/o the effort with no fatigue, etc.  Pt safe to return home, has a friend that can/will stay with him initially and assist with errands as needed.  No PT needs at this time, pt eager to go home.    Follow Up Recommendations No PT follow up    Equipment Recommendations  None recommended by PT    Recommendations for Other Services       Precautions / Restrictions Precautions Precautions: None Restrictions Weight Bearing Restrictions: No      Mobility  Bed Mobility Overal bed mobility: Independent             General bed mobility comments: easily gets to sitting EOB w/o assist  Transfers Overall transfer level: Modified independent Equipment used: None             General transfer comment: Pt able to rise confidently, no LOBs or overt safety issues - good  confidence  Ambulation/Gait Ambulation/Gait assistance: Supervision Gait Distance (Feet): 350 Feet Assistive device: None       General Gait Details: Pt was able to maintain consistent cadence and good confidence with 2 loops around the nurses' station.  His HR was in the 70s at the beginning of the effort, >100 by the end w/o c/o fatigue.    Stairs            Wheelchair Mobility    Modified Rankin (Stroke Patients Only)       Balance Overall balance assessment: Modified Independent                                           Pertinent Vitals/Pain Pain Assessment: No/denies pain    Home Living Family/patient expects to be discharged to:: Private residence Living Arrangements: Alone Available Help at Discharge: Friend(s);Available 24 hours/day   Home Access: Ramped entrance     Home Layout: One level Home Equipment: Walker - 2 wheels;Cane - single point      Prior Function Level of Independence: Independent         Comments: Pt reports that he drives, runs his errands and manage on his own, states he stays relatively active     Hand Dominance        Extremity/Trunk Assessment   Upper Extremity Assessment Upper Extremity Assessment: Overall WFL for tasks assessed  Lower Extremity Assessment Lower Extremity Assessment: Overall WFL for tasks assessed       Communication   Communication: No difficulties  Cognition Arousal/Alertness: Awake/alert Behavior During Therapy: WFL for tasks assessed/performed Overall Cognitive Status: Within Functional Limits for tasks assessed                                        General Comments General comments (skin integrity, edema, etc.): Pt cleared by cardio, still having variable HR this date.  On arrival ~50 bpm, up to >100 with "prolonged" ambulation, vaiable rate 40s-80s post session on tele screen     Exercises     Assessment/Plan    PT Assessment Patent does not  need any further PT services  PT Problem List         PT Treatment Interventions      PT Goals (Current goals can be found in the Care Plan section)  Acute Rehab PT Goals Patient Stated Goal: Go home today PT Goal Formulation: All assessment and education complete, DC therapy    Frequency     Barriers to discharge        Co-evaluation               AM-PAC PT "6 Clicks" Mobility  Outcome Measure Help needed turning from your back to your side while in a flat bed without using bedrails?: None Help needed moving from lying on your back to sitting on the side of a flat bed without using bedrails?: None Help needed moving to and from a bed to a chair (including a wheelchair)?: None Help needed standing up from a chair using your arms (e.g., wheelchair or bedside chair)?: None Help needed to walk in hospital room?: None Help needed climbing 3-5 steps with a railing? : None 6 Click Score: 24    End of Session Equipment Utilized During Treatment: Gait belt Activity Tolerance: Patient tolerated treatment well Patient left: with bed alarm set;with call bell/phone within reach Nurse Communication: Mobility status (HR pre/during/post ambulation) PT Visit Diagnosis: Difficulty in walking, not elsewhere classified (R26.2)    Time: 6578-4696 PT Time Calculation (min) (ACUTE ONLY): 16 min   Charges:   PT Evaluation $PT Eval Low Complexity: 1 Low          Malachi Pro, DPT 11/09/2019, 10:39 AM

## 2019-11-09 NOTE — Progress Notes (Signed)
Progress Note  Patient Name: Timothy Ramirez Date of Encounter: 11/09/2019  Primary Cardiologist: End  Subjective   No chest pain, dyspnea, palpitations, dizziness, presyncope, or syncope.  Still no bowel movement.  Positive flatus.  Inpatient Medications    Scheduled Meds: . atorvastatin  20 mg Oral q morning - 10a  . citalopram  20 mg Oral Daily  . enoxaparin (LOVENOX) injection  40 mg Subcutaneous Q24H  . insulin aspart  0-15 Units Subcutaneous TID WC  . insulin aspart  0-5 Units Subcutaneous QHS  . potassium chloride SA  20 mEq Oral Daily  . temazepam  30 mg Oral QHS   Continuous Infusions: . sodium chloride 75 mL/hr at 11/09/19 0600   PRN Meds: bisacodyl, ondansetron **OR** ondansetron (ZOFRAN) IV, zolpidem   Vital Signs    Vitals:   11/08/19 1548 11/08/19 1914 11/09/19 0436 11/09/19 0726  BP: (!) 153/81 (!) 163/78 (!) 160/68 139/66  Pulse: 81 95 60 68  Resp: 18 18  18   Temp: 98 F (36.7 C) 97.8 F (36.6 C) 97.7 F (36.5 C) 98.1 F (36.7 C)  TempSrc: Oral Oral Oral   SpO2: 100% 99% 100% 99%  Weight: 88.6 kg     Height: 5\' 11"  (1.803 m)       Intake/Output Summary (Last 24 hours) at 11/09/2019 1007 Last data filed at 11/09/2019 0600 Gross per 24 hour  Intake 1849.29 ml  Output 575 ml  Net 1274.29 ml   Filed Weights   11/07/19 1544 11/08/19 1548  Weight: 88.5 kg 88.6 kg    Telemetry    Sinus rhythm with brief episodes of 2-1 AV block with the last episode occurring at 741 this morning, rare PVCs and blocked PACs- Personally Reviewed  ECG    No new tracings - Personally Reviewed  Physical Exam   GEN: No acute distress.   Neck: No JVD. Cardiac: RRR, II/VI systolic murmur RUSB, no rubs, or gallops.  Respiratory: Clear to auscultation bilaterally.  GI: Soft, nontender, non-distended.   MS: No edema; No deformity. Neuro:  Alert and oriented x 3; Nonfocal.  Psych: Normal affect.  Labs    Chemistry Recent Labs  Lab 11/07/19 1555  11/08/19 0515  NA 137 137  K 3.2* 3.3*  CL 97* 101  CO2 25 28  GLUCOSE 127* 129*  BUN 28* 27*  CREATININE 1.52* 1.38*  CALCIUM 9.0 8.3*  PROT 7.4  --   ALBUMIN 3.9  --   AST 25  --   ALT 18  --   ALKPHOS 118  --   BILITOT 2.6*  --   GFRNONAA 41* 47*  GFRAA 48* 54*  ANIONGAP 15 8     Hematology Recent Labs  Lab 11/07/19 1555 11/08/19 0515  WBC 7.4 6.6  RBC 5.50 4.76  HGB 18.1* 15.6  HCT 48.6 43.1  MCV 88.4 90.5  MCH 32.9 32.8  MCHC 37.2* 36.2*  RDW 14.6 14.6  PLT 148* 121*    Cardiac EnzymesNo results for input(s): TROPONINI in the last 168 hours. No results for input(s): TROPIPOC in the last 168 hours.   BNPNo results for input(s): BNP, PROBNP in the last 168 hours.   DDimer No results for input(s): DDIMER in the last 168 hours.   Radiology    DG Abdomen 1 View  Result Date: 11/07/2019 IMPRESSION: Nonobstructive bowel gas pattern.  Moderate amount colonic stool. Electronically Signed   By: 11/10/19 M.D.   On: 11/07/2019 17:07  CT ABDOMEN PELVIS W CONTRAST  Result Date: 11/08/2019 IMPRESSION: Distal colonic diverticulosis without evidence of diverticulitis. Otherwise unremarkable examination of the bowel. No definite radiographic explanation for the patient's reported symptoms. Stable dilation of the a biliary tree, possibly representing post cholecystectomy change. Aortic Atherosclerosis (ICD10-I70.0). Electronically Signed   By: Helyn Numbers MD   On: 11/08/2019 00:44    Cardiac Studies   2D echo with bubble study 11/08/2019: 1. Left ventricular ejection fraction, by estimation, is 60 to 65%. The  left ventricle has normal function. The left ventricle has no regional  wall motion abnormalities. There is moderate left ventricular hypertrophy.  Left ventricular diastolic  parameters are consistent with Grade I diastolic dysfunction (impaired  relaxation).  2. Right ventricular systolic function is normal. The right ventricular  size is normal.  Tricuspid regurgitation signal is inadequate for assessing  PA pressure.  3. Left atrial size was mildly dilated.  4. The mitral valve is normal in structure. Mild to moderate mitral valve  regurgitation. No evidence of mitral stenosis. Moderate mitral annular  calcification.  5. The aortic valve is abnormal. Aortic valve regurgitation is trivial.  Moderate aortic valve stenosis. Aortic valve area, by VTI measures 1.36  cm. Aortic valve mean gradient measures 28.3 mmHg.  6. Agitated saline contrast bubble study was negative, with no evidence  of any interatrial shunt.   Patient Profile     86 y.o. male with history of moderate aortic stenosis, hypertension, hyperlipidemia who is being seen today for the evaluation of bradycardia.  Assessment & Plan    1.  Asymptomatic bradycardia/2-1 AV block: -Incidentally noted with patient presenting with chief complaint of constipation -Possibly increased vagal tone with significant constipation and in the setting of underlying hypokalemia -Recommend ambulation to assess chronotropic competence -Follow-up BMP is pending to assess potassium following oral repletion in the ED -Not on any AV nodal blocking medications at baseline with recommendation to continue to avoid these medications moving forward -TSH normal this admission -No emergent indication for PPM at this time -Place real-time Zio patch -Follow-up with EP as an outpatient  Hypokalemia: -Likely in the setting of HCTZ -Potasium of 3.2 upon arrival with a repeat value of 3.3 -Replete to goal 4.0 -BMP pending this morning  3. Elevated HS-Tn: -Patient presented with constipation  -No symptoms of angina  -Minimally elevated and flat trending  -Not consistent with ACS -Recent Lexiscan MPI without ischemia  -Echo with preserved LV SF as above -No indication for heparin gtt -No plans for inpatient ischemic evaluation  4. AKI: -Improving with hydration  -BMP pending this  morning -Per IM  5. Moderate aortic stenosis: -Echo as above -Follow-up as outpatient  6. HTN: -Blood pressure has been reasonably well controlled  -Not requiring medications  7. HLD: -PTA Lipitor   8. Constipation: -Reason for presentation -He does not recall when his last BM was -CT abdomen pelvis not acute  -Positive flatus -Per IM  If patient does well and is asymptomatic with ambulation, and once a Zio patch has been placed, he can be discharged from a cardiac perspective with outpatient follow-up with our office.  We will coordinate this.  For questions or updates, please contact [facility] Please consult [email or web address] for contact info under Cardiology/STEMI.    Signed, Eula Listen, PA-C [facility] Pager: [phone] 11/09/2019, 10:07 AM

## 2019-11-09 NOTE — Discharge Summary (Signed)
Physician Discharge Summary  Timothy Ramirez GGY:[id number] DOB: 12-30-1933 DOA: 11/07/2019  PCP: Jerl Mina, MD  Admit date: 11/07/2019 Discharge date: 11/09/2019  Admitted From: Home Disposition: Home  Recommendations for Outpatient Follow-up:  1. Follow up with PCP in 1-2 weeks 2. Follow with primary cardiologist in 2 weeks 3. Please obtain BMP/CBC in one week 4. Please follow up with your PCP on the following pending results: Unresulted Labs (From admission, onward)          Start     Ordered   11/15/19 0500  Creatinine, serum  (enoxaparin (LOVENOX)    CrCl >/= 30 ml/min)  Weekly,   STAT     Comments: while on enoxaparin therapy    11/08/19 0351           Home Health: None Equipment/Devices: None  Discharge Condition: Stable CODE STATUS: Full code Diet recommendation: Cardiac  Subjective: Seen and examined.  Feels much better.  No complaints.  Denied any chest pain or shortness of breath or any weakness.   Following HPI/ED course/EKG reading copied from H&P done by Dr. Para March. HPI: Timothy Ramirez is a 86 y.o. male with medical history significant for HTN, prediabetes, hypertension and moderate aortic stenosis who presented initially to the emergency room because of constipation.  He underwent an extensive evaluation in the emergency room with abdominal series and subsequently CT abdomen and pelvis that ruled out obstruction however during work-up he was noted to be bradycardic in the 40s with T wave inversion in lateral leads and hospitalist consulted for observation.  Patient is not on any rate control agents.  He denies chest pain or shortness of breath or palpitations.  Denies lightheadedness or dizziness.  Denies cough, shortness of breath fever or chills or dysuria.  Otherwise feels like he is in usual state of health. ED Course: Vitals in the ER within normal limits.  Troponin 35>>46, creatinine 1.52, slightly more elevated than baseline of 1.4 a couple months  prior.  Urinalysis unremarkable EKG: My independent interpretation : Sinus bradycardia 47 with T wave inversion in lateral leads unchanged from priors.  No acute ST-T wave changes.  On cardiac monitor at rest heart rate as low as 40.  With movement will go to mid 60s Due to concern for bradycardia, hospitalist consulted for observation   Brief/Interim Summary: Patient was admitted under hospitalist service for incidental finding of bradycardia.  He had some T wave inversions in the lateral leads.  No acute ST-T wave changes.  He was admitted for observation and cardiology was consulted.  TSH was within normal range.  He was not on any AV nodal blocking medications.  His heart rate improved on its own with occasional dips into bradycardia range but patient remained completely asymptomatic.  He also had hypokalemia for which he received replacement.  Per cardiology, his bradycardia could very well be due to persistent hypokalemia or vasovagal due to having constipation.  Despite of receiving potassium yesterday, patient's potassium is 3.2 today.  Again, he is asymptomatic.  He was evaluated by PT OT and they did not recommend any further PT or OT.  He has been cleared by cardiology however they are going to place a Zio patch on him today before discharge and they will follow with him in 2 weeks.  He will receive 2 more doses of potassium chloride 40 mEq today before discharge.  He is also on potassium chloride chronically from home.  Cardiology did not recommend any changes in his  home medication so they are being resumed without any changes.  He is being discharged in stable condition.  Discharge Diagnoses:  Principal Problem:   Bradycardia Active Problems:   Moderate aortic stenosis   Essential hypertension   Constipation   Elevated troponin   Diabetes mellitus type 2, uncomplicated (HCC)   Hypertension    Discharge Instructions  Discharge Instructions    Discharge patient   Complete by: As  directed    Discharge disposition: 01-Home or Self Care   Discharge patient date: 11/09/2019     Allergies as of 11/09/2019   No Known Allergies     Medication List    TAKE these medications   atorvastatin 20 MG tablet Commonly known as: LIPITOR Take 20 mg by mouth every morning.   citalopram 20 MG tablet Commonly known as: CELEXA Take 20 mg by mouth daily.   hydrochlorothiazide 25 MG tablet Commonly known as: HYDRODIURIL Take 25 mg by mouth daily.   potassium chloride SA 20 MEQ tablet Commonly known as: KLOR-CON Take 20 mEq by mouth daily.   rivastigmine 1.5 MG capsule Commonly known as: EXELON Take 1.5 mg by mouth 2 (two) times daily.   temazepam 30 MG capsule Commonly known as: RESTORIL Take 30 mg by mouth at bedtime.       Follow-up Information    Jerl Mina, MD On 11/11/2019.   Specialty: Family Medicine Why: @ 3:30pm Contact information: [address] [place] [address] [phone]        Yvonne Kendall, MD On 11/16/2019.   Specialty: Cardiology Why: @ 2pm Contact information: [address] [place] [address] [phone]              No Known Allergies  Consultations: Cardiology   Procedures/Studies: DG Abdomen 1 View  Result Date: 11/07/2019 CLINICAL DATA:  Constipation EXAM: ABDOMEN - 1 VIEW COMPARISON:  None. FINDINGS: The bowel gas pattern is normal. There is a moderate amount of right colonic stool present. Surgical clips are seen in the right upper quadrant. No radio-opaque calculi or other significant radiographic abnormality are seen. IMPRESSION: Nonobstructive bowel gas pattern.  Moderate amount colonic stool. Electronically Signed   By: Jonna Clark M.D.   On: 11/07/2019 17:07   CT ABDOMEN PELVIS W CONTRAST  Result Date: 11/08/2019 CLINICAL DATA:  Constipation, vomiting EXAM: CT ABDOMEN AND PELVIS WITH CONTRAST TECHNIQUE: Multidetector CT imaging of the abdomen and pelvis was performed using the  standard protocol following bolus administration of intravenous contrast. CONTRAST:  75mL OMNIPAQUE IOHEXOL 300 MG/ML  SOLN COMPARISON:  09/03/2019 FINDINGS: Lower chest: The visualized lung bases are clear bilaterally. Extensive calcification of the aortic valve leaflets is incompletely assessed on this exam. Mild calcification of the mitral valve annulus. Cardiac size within normal limits. No pericardial effusion. Small hiatal hernia. Hepatobiliary: Cholecystectomy has been performed. Mild intrahepatic and moderate extrahepatic biliary ductal dilation appears stable since prior examination and is nonspecific, possibly representing post cholecystectomy change. The liver is otherwise unremarkable. Pancreas: Mildly atrophic, but otherwise unremarkable. Spleen: Unremarkable Adrenals/Urinary Tract: The adrenal glands are unremarkable. The kidneys are normal in size and position. Simple cortical cyst noted within the lower pole of the left kidney. The kidneys are otherwise unremarkable. The bladder is largely decompressed and is unremarkable. Stomach/Bowel: The stomach and small bowel are unremarkable. There is severe descending and sigmoid colonic diverticulosis without superimposed inflammatory change. The large bowel is otherwise unremarkable. Appendix normal. No free intraperitoneal gas or fluid. Vascular/Lymphatic: Moderate aortoiliac  atherosclerotic calcification without evidence of aneurysm. There is particularly prominent calcification seen at the origin of the superior mesenteric artery, however, the degree of stenosis is not well assessed on this non arteriographic study. There is no pathologic adenopathy within the abdomen and pelvis. Reproductive: Mild prostatic enlargement. Seminal vesicles are unremarkable peer Other: Tiny fat containing umbilical and small direct fat containing right inguinal hernias are noted. The rectum is unremarkable. Musculoskeletal: Degenerative changes are seen within the lumbar  spine. No lytic or blastic bone lesion identified. IMPRESSION: Distal colonic diverticulosis without evidence of diverticulitis. Otherwise unremarkable examination of the bowel. No definite radiographic explanation for the patient's reported symptoms. Stable dilation of the a biliary tree, possibly representing post cholecystectomy change. Aortic Atherosclerosis (ICD10-I70.0). Electronically Signed   By: Helyn Numbers MD   On: 11/08/2019 00:44   ECHOCARDIOGRAM COMPLETE BUBBLE STUDY  Result Date: 11/08/2019    ECHOCARDIOGRAM REPORT   Patient Name:   Timothy Ramirez Date of Exam: 11/08/2019 Medical Rec #:  [id number]       Height:       71.0 in Accession #:    [id number]      Weight:       195.0 lb Date of Birth:  10-May-1933       BSA:          2.086 m Patient Age:    86 years        BP:           124/80 mmHg Patient Gender: M               HR:           78 bpm. Exam Location:  [facility] Procedure: 2D Echo, Cardiac Doppler, Color Doppler and Saline Contrast Bubble            Study Indications:     Troponin 1 above reference range  History:         Patient has prior history of Echocardiogram examinations, most                  recent 04/03/2018. Risk Factors:Hypertension and Dyslipidemia.  Sonographer:     Cristela Blue RDCS (AE) Referring Phys:  [id number] Zerita Boers SMITH Diagnosing Phys: Lorine Bears MD IMPRESSIONS  1. Left ventricular ejection fraction, by estimation, is 60 to 65%. The left ventricle has normal function. The left ventricle has no regional wall motion abnormalities. There is moderate left ventricular hypertrophy. Left ventricular diastolic parameters are consistent with Grade I diastolic dysfunction (impaired relaxation).  2. Right ventricular systolic function is normal. The right ventricular size is normal. Tricuspid regurgitation signal is inadequate for assessing PA pressure.  3. Left atrial size was mildly dilated.  4. The mitral valve is normal in structure. Mild to moderate mitral valve  regurgitation. No evidence of mitral stenosis. Moderate mitral annular calcification.  5. The aortic valve is abnormal. Aortic valve regurgitation is trivial. Moderate aortic valve stenosis. Aortic valve area, by VTI measures 1.36 cm. Aortic valve mean gradient measures 28.3 mmHg.  6. Agitated saline contrast bubble study was negative, with no evidence of any interatrial shunt. FINDINGS  Left Ventricle: Left ventricular ejection fraction, by estimation, is 60 to 65%. The left ventricle has normal function. The left ventricle has no regional wall motion abnormalities. The left ventricular internal cavity size was normal in size. There is  moderate left ventricular hypertrophy. Left ventricular diastolic parameters are consistent with Grade I diastolic dysfunction (impaired relaxation). Right Ventricle:  The right ventricular size is normal. No increase in right ventricular wall thickness. Right ventricular systolic function is normal. Tricuspid regurgitation signal is inadequate for assessing PA pressure. Left Atrium: Left atrial size was mildly dilated. Right Atrium: Right atrial size was normal in size. Pericardium: There is no evidence of pericardial effusion. Mitral Valve: The mitral valve is normal in structure. Moderate mitral annular calcification. Mild to moderate mitral valve regurgitation. No evidence of mitral valve stenosis. Tricuspid Valve: The tricuspid valve is normal in structure. Tricuspid valve regurgitation is not demonstrated. No evidence of tricuspid stenosis. Aortic Valve: The aortic valve is abnormal. Aortic valve regurgitation is trivial. Moderate aortic stenosis is present. Aortic valve mean gradient measures 28.3 mmHg. Aortic valve peak gradient measures 40.5 mmHg. Aortic valve area, by VTI measures 1.36 cm. Pulmonic Valve: The pulmonic valve was normal in structure. Pulmonic valve regurgitation is not visualized. No evidence of pulmonic stenosis. Aorta: The aortic root is normal in size  and structure. Venous: The inferior vena cava was not well visualized. IAS/Shunts: No atrial level shunt detected by color flow Doppler. Agitated saline contrast was given intravenously to evaluate for intracardiac shunting. Agitated saline contrast bubble study was negative, with no evidence of any interatrial shunt.  LEFT VENTRICLE PLAX 2D LVIDd:         4.50 cm  Diastology LVIDs:         2.61 cm  LV e' medial:    0.05 cm/s LV PW:         1.34 cm  LV E/e' medial:  14.4 LV IVS:        1.01 cm  LV e' lateral:   0.03 cm/s LVOT diam:     2.20 cm  LV E/e' lateral: 24.3 LV SV:         92 LV SV Index:   44 LVOT Area:     3.80 cm  RIGHT VENTRICLE RV Basal diam:  2.91 cm RV S prime:     16.40 cm/s TAPSE (M-mode): 3.4 cm LEFT ATRIUM             Index       RIGHT ATRIUM           Index LA diam:        3.60 cm 1.73 cm/m  RA Area:     13.80 cm LA Vol (A2C):   70.2 ml 33.65 ml/m RA Volume:   26.70 ml  12.80 ml/m LA Vol (A4C):   53.0 ml 25.41 ml/m LA Biplane Vol: 62.5 ml 29.96 ml/m  AORTIC VALVE                    PULMONIC VALVE AV Area (Vmax):    1.39 cm     PV Vmax:        0.74 m/s AV Area (Vmean):   1.23 cm     PV Peak grad:   2.2 mmHg AV Area (VTI):     1.36 cm     RVOT Peak grad: 4 mmHg AV Vmax:           318.33 cm/s AV Vmean:          257.667 cm/s AV VTI:            0.680 m AV Peak Grad:      40.5 mmHg AV Mean Grad:      28.3 mmHg LVOT Vmax:         116.00 cm/s LVOT Vmean:  83.500 cm/s LVOT VTI:          0.243 m LVOT/AV VTI ratio: 0.36  AORTA Ao Root diam: 3.30 cm MITRAL VALVE MV Area (PHT): 2.29 cm     SHUNTS MV Decel Time: 332 msec     Systemic VTI:  0.24 m MV E velocity: 0.66 cm/s    Systemic Diam: 2.20 cm MV A velocity: 105.00 cm/s MV E/A ratio:  0.01 Lorine Bears MD Electronically signed by Lorine Bears MD Signature Date/Time: 11/08/2019/4:45:13 PM    Final      Discharge Exam: Vitals:   11/09/19 0436 11/09/19 0726  BP: (!) 160/68 139/66  Pulse: 60 68  Resp:  18  Temp: 97.7 F (36.5 C)  98.1 F (36.7 C)  SpO2: 100% 99%   Vitals:   11/08/19 1548 11/08/19 1914 11/09/19 0436 11/09/19 0726  BP: (!) 153/81 (!) 163/78 (!) 160/68 139/66  Pulse: 81 95 60 68  Resp: Temp: 98 F (36.7 C) 97.8 F (36.6 C) 97.7 F (36.5 C) 98.1 F (36.7 C)  TempSrc: Oral Oral Oral Oral  SpO2: 100% 99% 100% 99%  Weight: 88.6 kg     Height:  (1.803 m)       General: Pt is alert, awake, not in acute distress Cardiovascular: RRR, S1/S2 +, no rubs, no gallops Respiratory: CTA bilaterally, no wheezing, no rhonchi Abdominal: Soft, NT, ND, bowel sounds + Extremities: no edema, no cyanosis    The results of significant diagnostics from this hospitalization (including imaging, microbiology, ancillary and laboratory) are listed below for reference.     Microbiology: Recent Results (from the past 240 hour(s))  SARS Coronavirus 2 by RT PCR (hospital order, performed in [facility] hospital lab) Nasopharyngeal Nasopharyngeal Swab     Status: None   Collection Time: 11/08/19 12:23 AM   Specimen: Nasopharyngeal Swab  Result Value Ref Range Status   SARS Coronavirus 2 NEGATIVE NEGATIVE Final    Comment: (NOTE) SARS-CoV-2 target nucleic acids are NOT DETECTED.  The SARS-CoV-2 RNA is generally detectable in upper and lower respiratory specimens during the acute phase of infection. The lowest concentration of SARS-CoV-2 viral copies this assay can detect is 250 copies / mL. A negative result does not preclude SARS-CoV-2 infection and should not be used as the sole basis for treatment or other patient management decisions.  A negative result may occur with improper specimen collection / handling, submission of specimen other than nasopharyngeal swab, presence of viral mutation(s) within the areas targeted by this assay, and inadequate number of viral copies (<250 copies / mL). A negative result must be combined with clinical observations, patient history, and epidemiological  information.  Fact Sheet for Patients:   BoilerBrush.com.cy  Fact Sheet for Healthcare Providers: https://pope.com/  This test is not yet approved or  cleared by the [place] FDA and has been authorized for detection and/or diagnosis of SARS-CoV-2 by FDA under an Emergency Use Authorization (EUA).  This EUA will remain in effect (meaning this test can be used) for the duration of the COVID-19 declaration under Section 564(b)(1) of the Act, 21 U.S.C. section 360bbb-3(b)(1), unless the authorization is terminated or revoked sooner.  Performed at [facility], [address] [place], [place] [address]      Labs: BNP (last 3 results) No results for input(s): BNP in the last 8760 hours. Basic Metabolic Panel: Recent Labs  Lab 11/07/19 1555 11/07/19 2129 11/08/19 0515 11/09/19 0929  NA 137  --  137 137  K 3.2*  --  3.3* 3.2*  CL 97*  --  101 103  CO2 25  --  28 25  GLUCOSE 127*  --  129* 134*  BUN 28*  --  27* 16  CREATININE 1.52*  --  1.38* 1.10  CALCIUM 9.0  --  8.3* 8.1*  MG  --  2.2  --   --    Liver Function Tests: Recent Labs  Lab 11/07/19 1555  AST 25  ALT 18  ALKPHOS 118  BILITOT 2.6*  PROT 7.4  ALBUMIN 3.9   Recent Labs  Lab 11/07/19 1555  LIPASE 23   No results for input(s): AMMONIA in the last 168 hours. CBC: Recent Labs  Lab 11/07/19 1555 11/08/19 0515  WBC 7.4 6.6  NEUTROABS 4.4  --   HGB 18.1* 15.6  HCT 48.6 43.1  MCV 88.4 90.5  PLT 148* 121*   Cardiac Enzymes: No results for input(s): CKTOTAL, CKMB, CKMBINDEX, TROPONINI in the last 168 hours. BNP: Invalid input(s): POCBNP CBG: Recent Labs  Lab 11/08/19 0746 11/08/19 1227 11/08/19 1626 11/08/19 2102 11/09/19 0725  GLUCAP 121* 109* 120* 105* 108*   D-Dimer No results for input(s): DDIMER in the last 72 hours. Hgb A1c Recent Labs    11/08/19 0515  HGBA1C 5.9*   Lipid Profile No results for input(s): CHOL,  HDL, LDLCALC, TRIG, CHOLHDL, LDLDIRECT in the last 72 hours. Thyroid function studies Recent Labs    11/07/19 2129  TSH 2.553   Anemia work up No results for input(s): VITAMINB12, FOLATE, FERRITIN, TIBC, IRON, RETICCTPCT in the last 72 hours. Urinalysis    Component Value Date/Time   COLORURINE YELLOW (A) 11/07/2019 1555   APPEARANCEUR CLEAR (A) 11/07/2019 1555   APPEARANCEUR Hazy (A) 09/08/2019 0948   LABSPEC 1.025 11/07/2019 1555   PHURINE 6.0 11/07/2019 1555   GLUCOSEU NEGATIVE 11/07/2019 1555   HGBUR NEGATIVE 11/07/2019 1555   BILIRUBINUR NEGATIVE 11/07/2019 1555   BILIRUBINUR Negative 09/08/2019 0948   KETONESUR NEGATIVE 11/07/2019 1555   PROTEINUR NEGATIVE 11/07/2019 1555   NITRITE NEGATIVE 11/07/2019 1555   LEUKOCYTESUR NEGATIVE 11/07/2019 1555   Sepsis Labs Invalid input(s): PROCALCITONIN,  WBC,  LACTICIDVEN Microbiology Recent Results (from the past 240 hour(s))  SARS Coronavirus 2 by RT PCR (hospital order, performed in [facility] hospital lab) Nasopharyngeal Nasopharyngeal Swab     Status: None   Collection Time: 11/08/19 12:23 AM   Specimen: Nasopharyngeal Swab  Result Value Ref Range Status   SARS Coronavirus 2 NEGATIVE NEGATIVE Final    Comment: (NOTE) SARS-CoV-2 target nucleic acids are NOT DETECTED.  The SARS-CoV-2 RNA is generally detectable in upper and lower respiratory specimens during the acute phase of infection. The lowest concentration of SARS-CoV-2 viral copies this assay can detect is 250 copies / mL. A negative result does not preclude SARS-CoV-2 infection and should not be used as the sole basis for treatment or other patient management decisions.  A negative result may occur with improper specimen collection / handling, submission of specimen other than nasopharyngeal swab, presence of viral mutation(s) within the areas targeted by this assay, and inadequate number of viral copies (<250 copies / mL). A negative result must be combined with  clinical observations, patient history, and epidemiological information.  Fact Sheet for Patients:   BoilerBrush.com.cy  Fact Sheet for Healthcare Providers: https://pope.com/  This test is not yet approved or  cleared by the [place] FDA and has been authorized for detection and/or diagnosis of  SARS-CoV-2 by FDA under an Emergency Use Authorization (EUA).  This EUA will remain in effect (meaning this test can be used) for the duration of the COVID-19 declaration under Section 564(b)(1) of the Act, 21 U.S.C. section 360bbb-3(b)(1), unless the authorization is terminated or revoked sooner.  Performed at [facility], [address] [place], [place] [address]      Time coordinating discharge: Over 30 minutes  SIGNED:   Hughie Closs, MD  [facility] 11/09/2019, 11:06 AM  If 7PM-7AM, please contact night-coverage [email or web address]

## 2019-11-09 NOTE — Discharge Instructions (Signed)
Bradycardia, Adult Bradycardia is a slower-than-normal heartbeat. A normal resting heart rate for an adult ranges from 60 to 100 beats per minute. With bradycardia, the resting heart rate is less than 60 beats per minute. Bradycardia can prevent enough oxygen from reaching certain areas of your body when you are active. It can be serious if it keeps enough oxygen from reaching your brain and other parts of your body. Bradycardia is not a problem for everyone. For some healthy adults, a slow resting heart rate is normal. What are the causes? This condition may be caused by:  A problem with the heart, including: ? A problem with the heart's electrical system, such as a heart block. With a heart block, electrical signals between the chambers of the heart are partially or completely blocked, so they are not able to work as they should. ? A problem with the heart's natural pacemaker (sinus node). ? Heart disease. ? A heart attack. ? Heart damage. ? Lyme disease. ? A heart infection. ? A heart condition that is present at birth (congenital heart defect).  Certain medicines that treat heart conditions.  Certain conditions, such as hypothyroidism and obstructive sleep apnea.  Problems with the balance of chemicals and other substances, like potassium, in the blood.  Trauma.  Radiation therapy. What increases the risk? You are more likely to develop this condition if you:  Are age 65 or older.  Have high blood pressure (hypertension), high cholesterol (hyperlipidemia), or diabetes.  Drink heavily, use tobacco or nicotine products, or use drugs. What are the signs or symptoms? Symptoms of this condition include:  Light-headedness.  Feeling faint or fainting.  Fatigue and weakness.  Trouble with activity or exercise.  Shortness of breath.  Chest pain (angina).  Drowsiness.  Confusion.  Dizziness. How is this diagnosed? This condition may be diagnosed based on:  Your  symptoms.  Your medical history.  A physical exam. During the exam, your health care provider will listen to your heartbeat and check your pulse. To confirm the diagnosis, your health care provider may order tests, such as:  Blood tests.  An electrocardiogram (ECG). This test records the heart's electrical activity. The test can show how fast your heart is beating and whether the heartbeat is steady.  A test in which you wear a portable device (event recorder or Holter monitor) to record your heart's electrical activity while you go about your day.  Anexercise test. How is this treated? Treatment for this condition depends on the cause of the condition and how severe your symptoms are. Treatment may involve:  Treatment of the underlying condition.  Changing your medicines or how much medicine you take.  Having a small, battery-operated device called a pacemaker implanted under the skin. When bradycardia occurs, this device can be used to increase your heart rate and help your heart beat in a regular rhythm. Follow these instructions at home: Lifestyle   Manage any health conditions that contribute to bradycardia as told by your health care provider.  Follow a heart-healthy diet. A nutrition specialist (dietitian) can help educate you about healthy food options and changes.  Follow an exercise program that is approved by your health care provider.  Maintain a healthy weight.  Try to reduce or manage your stress, such as with yoga or meditation. If you need help reducing stress, ask your health care provider.  Do not use any products that contain nicotine or tobacco, such as cigarettes, e-cigarettes, and chewing tobacco. If you need help   quitting, ask your health care provider.  Do not use illegal drugs.  Limit alcohol intake to no more than 1 drink a day for nonpregnant women and 2 drinks a day for men. Be aware of how much alcohol is in your drink. In the [place] one drink  equals one 12 oz bottle of beer (355 mL), one 5 oz glass of wine (148 mL), or one 1 oz glass of hard liquor (44 mL). General instructions  Take over-the-counter and prescription medicines only as told by your health care provider.  Keep all follow-up visits as told by your health care provider. This is important. How is this prevented? In some cases, bradycardia may be prevented by:  Treating underlying medical problems.  Stopping behaviors or medicines that can trigger the condition. Contact a health care provider if you:  Feel light-headed or dizzy.  Almost faint.  Feel weak or are easily fatigued during physical activity.  Experience confusion or have memory problems. Get help right away if:  You faint.  You have: ? An irregular heartbeat (palpitations). ? Chest pain. ? Trouble breathing. Summary  Bradycardia is a slower-than-normal heartbeat. With bradycardia, the resting heart rate is less than 60 beats per minute.  Treatment for this condition depends on the cause.  Manage any health conditions that contribute to bradycardia as told by your health care provider.  Do not use any products that contain nicotine or tobacco, such as cigarettes, e-cigarettes, and chewing tobacco, and limit alcohol intake.  Keep all follow-up visits as told by your health care provider. This is important. This information is not intended to replace advice given to you by your health care provider. Make sure you discuss any questions you have with your health care provider. Document Revised: 08/18/2017 Document Reviewed: 07/16/2017 Elsevier Patient Education  2020 [employer] Inc[employer]  

## 2019-11-09 NOTE — Evaluation (Signed)
Occupational Therapy Evaluation Patient Details Name: Timothy Ramirez MRN: [id number] DOB: 06/01/1933 Today's Date: 11/09/2019    History of Present Illness Timothy Ramirez is a 86 y.o. male with medical history significant for HTN, prediabetes, hypertension and moderate aortic stenosis who presented initially to the emergency room because of constipation.  He underwent an extensive evaluation in the emergency room with abdominal series and subsequently CT abdomen and pelvis that ruled out obstruction however during work-up he was noted to be bradycardic in the 40s with T wave inversion in lateral leads and hospitalist consulted for observation.  Patient is not on any rate control agents.   Clinical Impression   Prior to hospital admission, pt was IND in ADL and IADL. He lives alone since his wife died six years ago. He drives, is active in the community, and has a group of friends he gets together with 2x/day, for breakfast and supper. He reports no falls in the previous 12 months. During today's evaluation, pt is A&O x 4, with good BUE strength, no concerns re: functional mobility, balance, or activity tolerance. Pt states that he is eager to get home because he and some friends have a trip to a gambling casino planned for later this week. No need for skilled OT services noted at this time. Pt is safe to go home and resume his active lifestyle.    Follow Up Recommendations  No OT follow up    Equipment Recommendations  None recommended by OT    Recommendations for Other Services       Precautions / Restrictions Precautions Precautions: None Restrictions Weight Bearing Restrictions: No      Mobility Bed Mobility Overal bed mobility: Independent             General bed mobility comments: easily gets to sitting EOB w/o assist  Transfers Overall transfer level: Modified independent Equipment used: None             General transfer comment: Pt able to rise confidently, no  LOBs or overt safety issues - good confidence    Balance Overall balance assessment: Modified Independent                                         ADL either performed or assessed with clinical judgement   ADL Overall ADL's : Independent                                       General ADL Comments: Pt has lived alone since his wife died of cancer six years ago. He completes all ADL and IADL independently     Vision Baseline Vision/History: Wears glasses Wears Glasses: At all times Patient Visual Report: No change from baseline       Perception     Praxis      Pertinent Vitals/Pain Pain Assessment: No/denies pain     Hand Dominance     Extremity/Trunk Assessment Upper Extremity Assessment Upper Extremity Assessment: Overall WFL for tasks assessed   Lower Extremity Assessment Lower Extremity Assessment: Overall WFL for tasks assessed       Communication Communication Communication: No difficulties   Cognition Arousal/Alertness: Awake/alert Behavior During Therapy: WFL for tasks assessed/performed Overall Cognitive Status: Within Functional Limits for tasks assessed  General Comments  Pt cleared by cardio, still having variable HR this date.  On arrival ~50 bpm, up to >100 with "prolonged" ambulation, vaiable rate 40s-80s post session on tele screen     Exercises Other Exercises Other Exercises: Educated role of OT, plan of care; functional mobility   Shoulder Instructions      Home Living Family/patient expects to be discharged to:: Private residence Living Arrangements: Alone Available Help at Discharge: Friend(s);Available 24 hours/day Type of Home: House Home Access: Ramped entrance     Home Layout: One level         Bathroom Toilet: Standard     Home Equipment: Walker - 2 wheels;Cane - single point          Prior Functioning/Environment Level of  Independence: Independent        Comments: Pt eats out 2 x/day, joining a regular group of friends in a restaurant to breakfast and supper, 10 mile [address] each way.        OT Problem List: Decreased activity tolerance;Cardiopulmonary status limiting activity      OT Treatment/Interventions:      OT Goals(Current goals can be found in the care plan section) Acute Rehab OT Goals Patient Stated Goal: Go home today OT Goal Formulation: With patient Time For Goal Achievement: 11/23/19 Potential to Achieve Goals: Good  OT Frequency:     Barriers to D/C:            Co-evaluation              AM-PAC OT "6 Clicks" Daily Activity     Outcome Measure Help from another person eating meals?: None Help from another person taking care of personal grooming?: None Help from another person toileting, which includes using toliet, bedpan, or urinal?: None Help from another person bathing (including washing, rinsing, drying)?: None Help from another person to put on and taking off regular upper body clothing?: None Help from another person to put on and taking off regular lower body clothing?: None 6 Click Score: 24   End of Session    Activity Tolerance: Patient tolerated treatment well Patient left: in bed;with call bell/phone within reach  OT Visit Diagnosis: Other abnormalities of gait and mobility (R26.89)                Time: 0938-1829 OT Time Calculation (min): 14 min Charges:  OT General Charges $OT Visit: 1 Visit OT Evaluation $OT Eval Low Complexity: 1 Low  Latina Craver, PhD, MS, OTR/L ascom [phone] 11/09/19, 12:33 PM

## 2019-11-09 NOTE — Progress Notes (Signed)
Cardiology Office Note    Date:  11/16/2019   ID:  Timothy Ramirez, DOB May 05, 1933, MRN [id number]  PCP:  Jerl Mina, MD  Cardiologist:  Yvonne Kendall, MD  Electrophysiologist:  None   Chief Complaint: Hospital follow up  History of Present Illness:   Timothy Ramirez is a 86 y.o. male with history of moderate aortic stenosis, hypertension, and hyperlipidemia who presents for hospital follow up after recent admission from 9/20-9/21 for bradycardia.   [name] was initially evaluated by cardiology in 03/2018 following an ED visit for chest and left arm pain that has been intermittent for several years. At that time, ED work-up was unrevealing. Echo showed an EF greater than 55%, grade 1 diastolic dysfunction, normal RV systolic function and ventricular cavity size, mild left atrial enlargement, calcified aortic valve with moderate stenosis with a mean gradient of 23 mmHg, and mild to moderate mitral vegetation. When compared to prior echo performed at outside office in 2018 his EF remains unchanged and his aortic valve disease at progressed from mild to now moderate. He underwent exercise MPI in 03/2018 which was low risk and showed no significant ischemia or scar. Limited exercise capacity was noted secondary to significant shortness of breath. Patient had frequent PVCs during recovery. He was last seen in 02/2019 doing well from a cardiac perspective.  He initially presented to [facility] on 11/07/2019 with a chief complaint of constipation and was uncertain when his last bowel movement was. CT of the abdomen/pelvis showed distal colonic diverticulosis not evidence of diverticulitis and was otherwise unremarkable. In the ED, he was noted to be bradycardic with heart rates in the 40s bpm and asymptomatic. High-sensitivity troponin was checked with an initial value of 32 and a delta of 35. He was hypokalemic with a potassium of 3.2. He was noted to not be on any AV nodal blocking agents. Echo showed  an EF of 60 to 65%, no regional wall motion enteritis, moderate LVH, grade 1 diastolic dysfunction, normal RV systolic function and ventricular cavity size, mildly dilated left atrium, mild to moderate mitral regurgitation, moderate mitral annular calcification, trivial aortic valve insufficiency, moderate aortic valve stenosis with a mean gradient of 28.3 mmHg and a VTI of 1.36 cm. Telemetry demonstrated intermittent episodes of 2-1 AV block. He demonstrated appropriate chronotropic competence with PT. He remained asymptomatic. Real-time Zio patch was placed at time of discharge and remains pending at this time.  He comes in accompanied by his daughter today and has done reasonably well since his hospital discharge.  He continues to note significant fatigue.  He did have some positional dizziness over this past weekend at which time EMS was called to his house and indicated his vitals were unrevealing.  He denies any presyncope or syncope.  No chest pain, dyspnea, or palpitations.  He continues to deal with some degree of constipation as well as fecal incontinence when he has had a bowel movement.  His appetite remains somewhat decreased.   Labs independently reviewed: 10/2019 - potassium 3.2, BUN 16, serum creatinine 1.10, Hgb 15.6, PLT 121, A1c 5.9, TSH normal, magnesium 2.2, BUN 3.9, AST/ALT normal 05/2019 - TC 142, TG 121, HDL 39, LDL 79  Past Medical History:  Diagnosis Date  . Borderline diabetes   . Hyperlipidemia   . Hypertension     Past Surgical History:  Procedure Laterality Date  . APPENDECTOMY    . HERNIA REPAIR    . TONSILLECTOMY      Current Medications:  Current Meds  Medication Sig  . atorvastatin (LIPITOR) 20 MG tablet Take 20 mg by mouth every morning.  . citalopram (CELEXA) 20 MG tablet Take 20 mg by mouth daily.  . hydrochlorothiazide (HYDRODIURIL) 25 MG tablet Take 25 mg by mouth daily.  . potassium chloride SA (KLOR-CON) 20 MEQ tablet Take 20 mEq by mouth daily.  .  rivastigmine (EXELON) 1.5 MG capsule Take 1.5 mg by mouth 2 (two) times daily.  . temazepam (RESTORIL) 30 MG capsule Take 30 mg by mouth at bedtime.     Allergies:   Patient has no known allergies.   Social History   Socioeconomic History  . Marital status: Widowed    Spouse name: Not on file  . Number of children: Not on file  . Years of education: Not on file  . Highest education level: Not on file  Occupational History  . Not on file  Tobacco Use  . Smoking status: Never Smoker  . Smokeless tobacco: Never Used  Vaping Use  . Vaping Use: Never used  Substance and Sexual Activity  . Alcohol use: Yes    Alcohol/week: 2.0 standard drinks    Types: 1 Cans of beer, 1 Shots of liquor per week  . Drug use: No  . Sexual activity: Not Currently  Other Topics Concern  . Not on file  Social History Narrative  . Not on file   Social Determinants of Health   Financial Resource Strain:   . Difficulty of Paying Living Expenses: Not on file  Food Insecurity:   . Worried About Programme researcher, broadcasting/film/video in the Last Year: Not on file  . Ran Out of Food in the Last Year: Not on file  Transportation Needs:   . Lack of Transportation (Medical): Not on file  . Lack of Transportation (Non-Medical): Not on file  Physical Activity:   . Days of Exercise per Week: Not on file  . Minutes of Exercise per Session: Not on file  Stress:   . Feeling of Stress : Not on file  Social Connections:   . Frequency of Communication with Friends and Family: Not on file  . Frequency of Social Gatherings with Friends and Family: Not on file  . Attends Religious Services: Not on file  . Active Member of Clubs or Organizations: Not on file  . Attends Banker Meetings: Not on file  . Marital Status: Not on file     Family History:  The patient's family history includes COPD in his sister; Cirrhosis in his father; Congestive Heart Failure in his mother; Heart attack (age of onset: 58) in his  mother.  ROS:   Review of Systems  Constitutional: Positive for malaise/fatigue. Negative for chills, diaphoresis, fever and weight loss.  HENT: Negative for congestion.   Eyes: Negative for discharge and redness.  Respiratory: Positive for shortness of breath. Negative for cough, sputum production and wheezing.   Cardiovascular: Negative for chest pain, palpitations, orthopnea, claudication, leg swelling and PND.  Gastrointestinal: Positive for constipation. Negative for abdominal pain, diarrhea, heartburn, nausea and vomiting.  Musculoskeletal: Negative for falls and myalgias.  Skin: Negative for rash.  Neurological: Positive for weakness. Negative for dizziness, tingling, tremors, sensory change, speech change, focal weakness and loss of consciousness.  Endo/Heme/Allergies: Does not bruise/bleed easily.  Psychiatric/Behavioral: Negative for substance abuse. The patient is not nervous/anxious.   All other systems reviewed and are negative.    EKGs/Labs/Other Studies Reviewed:    Studies reviewed were summarized above.  The additional studies were reviewed today:  2D echo 11/08/2019: 2D echo with bubble study 11/08/2019: 1. Left ventricular ejection fraction, by estimation, is 60 to 65%. The  left ventricle has normal function. The left ventricle has no regional  wall motion abnormalities. There is moderate left ventricular hypertrophy.  Left ventricular diastolic  parameters are consistent with Grade I diastolic dysfunction (impaired  relaxation).  2. Right ventricular systolic function is normal. The right ventricular  size is normal. Tricuspid regurgitation signal is inadequate for assessing  PA pressure.  3. Left atrial size was mildly dilated.  4. The mitral valve is normal in structure. Mild to moderate mitral valve  regurgitation. No evidence of mitral stenosis. Moderate mitral annular  calcification.  5. The aortic valve is abnormal. Aortic valve regurgitation is  trivial.  Moderate aortic valve stenosis. Aortic valve area, by VTI measures 1.36  cm. Aortic valve mean gradient measures 28.3 mmHg.  6. Agitated saline contrast bubble study was negative, with no evidence  of any interatrial shunt.  __________  2D echo 03/2018: 1. The cavity size was normal. Normal LV systolic function, EF >55  2. There is mild to moderately increased left ventricular wall thickness.  Grade I diastolic dysfunction  3. The right ventricle has normal systolic function. The cavity was  normal. There is no increase in right ventricular wall thickness.  4. Left atrial size was mildly dilated.  5. Severe calcifcation of the aortic valve. Moderate stenosis of the  aortic valve.AV Mean Grad: 23.0 mmHg  6. Mitral valve regurgitation is mild to moderate by color flow Doppler.  7. Right atrial pressure is estimated at 10 mmHg.  __________  Eugenie Birks MPI 03/2018:  No T wave inversion was noted during stress.  The study is normal.  This is a low risk study.  The left ventricular ejection fraction is normal (55-65%).  The patient exercised for 4 minutes and 45 seconds and was limited by severe shortness of breath. Frequent PVCs noted in recovery.   EKG:  EKG is ordered today.  The EKG ordered today demonstrates NSR with sinus arrhythmia, 69 bpm, RBBB, nonspecific ST-T changes, largely unchanged when compared to prior tracing  Recent Labs: 11/07/2019: ALT 18; Magnesium 2.2; TSH 2.553 11/08/2019: Hemoglobin 15.6; Platelets 121 11/09/2019: BUN 16; Creatinine, Ser 1.10; Potassium 3.2; Sodium 137  Recent Lipid Panel No results found for: CHOL, TRIG, HDL, CHOLHDL, VLDL, LDLCALC, LDLDIRECT  PHYSICAL EXAM:    VS:  BP 120/74 (BP Location: Left Arm, Patient Position: Sitting, Cuff Size: Normal)   Pulse 69   Ht 5\' 11"  (1.803 m)   Wt 189 lb (85.7 kg)   SpO2 97%   BMI 26.36 kg/m   BMI: Body mass index is 26.36 kg/m.  Physical Exam  Wt Readings from Last 3 Encounters:   11/16/19 189 lb (85.7 kg)  11/08/19 195 lb 6.4 oz (88.6 kg)  09/08/19 195 lb (88.5 kg)     ASSESSMENT & PLAN:   1. Bradycardia/2-1 AV block: No symptoms of presyncope or syncope.  Continue to avoid AV nodal blocking agents.  Possibly in the setting of increased vagal tone or electrolyte derangements.  Check BMP to ensure repletion of potassium.  Preliminary review of available data shows sinus rhythm with occasional isolated 2-1 AV block without symptoms being reported.  Zio patch pending.  Refer to EP upon completion of Zio patch.  2. Elevated troponin: Uncertain why this was checked in the ED given his symptoms upon presentation were of constipation  and without symptoms concerning for angina.  Nonetheless, his troponin was minimally elevated and not consistent with ACS.  He will require an ischemic evaluation though given noted 2-1 AV block and in the setting of the patient being without symptoms concerning for ischemia this is deferred at this time.  We will asked that he be evaluated by EP initially given a 2-1 AV block noted on telemetry and on outpatient cardiac monitoring.  If he is unable to proceed with Lexiscan MPI or coronary CTA we would need to undergo diagnostic cardiac cath.  3. Moderate aortic stenosis: Moderate by most recent echo.  Follow-up periodic imaging.  If diagnostic cath is needed for further evaluation of his recent elevated troponin this can be further reassessed at that time.  4. HTN: Blood pressure is well controlled in the office today.  He remains on HCTZ.  5. HLD: LDL 79 from 05/2019 with normal LFT in 10/2019.  He remains on atorvastatin.  6. Hypokalemia: He is on KCl 20 mEq daily.  Check BMP.  7. Constipation/fecal incontinence: Possibly increasing his vagal tone contributing to his overall presentation.  Recommend he follow-up with PCP as directed.  Disposition: F/u with Dr. Okey DupreEnd or an APP in 3 months.   Medication Adjustments/Labs and Tests  Ordered: Current medicines are reviewed at length with the patient today.  Concerns regarding medicines are outlined above. Medication changes, Labs and Tests ordered today are summarized above and listed in the Patient Instructions accessible in Encounters.   Signed, Eula Listenyan Oviya Ammar, PA-C 11/16/2019 2:33 PM     [facility] [place] [address] [place], [place] [address] [phone]

## 2019-11-09 NOTE — Progress Notes (Signed)
RN updated daughter Lynnell Dike on patient discharge. RN reviewed discharge instructions and follow up appt information with daughter over the phone. All verbalized questions answered. Daughter verbalized understanding of discharge. I will continue to assess.

## 2019-11-16 ENCOUNTER — Other Ambulatory Visit: Payer: Self-pay

## 2019-11-16 ENCOUNTER — Encounter: Payer: Self-pay | Admitting: Physician Assistant

## 2019-11-16 ENCOUNTER — Ambulatory Visit (INDEPENDENT_AMBULATORY_CARE_PROVIDER_SITE_OTHER): Payer: Medicare Other | Admitting: Physician Assistant

## 2019-11-16 VITALS — BP 120/74 | HR 69 | Ht 71.0 in | Wt 189.0 lb

## 2019-11-16 DIAGNOSIS — E876 Hypokalemia: Secondary | ICD-10-CM

## 2019-11-16 DIAGNOSIS — I350 Nonrheumatic aortic (valve) stenosis: Secondary | ICD-10-CM | POA: Diagnosis not present

## 2019-11-16 DIAGNOSIS — R778 Other specified abnormalities of plasma proteins: Secondary | ICD-10-CM | POA: Diagnosis not present

## 2019-11-16 DIAGNOSIS — I10 Essential (primary) hypertension: Secondary | ICD-10-CM

## 2019-11-16 DIAGNOSIS — I4430 Unspecified atrioventricular block: Secondary | ICD-10-CM | POA: Diagnosis not present

## 2019-11-16 DIAGNOSIS — E782 Mixed hyperlipidemia: Secondary | ICD-10-CM

## 2019-11-16 DIAGNOSIS — K5900 Constipation, unspecified: Secondary | ICD-10-CM

## 2019-11-16 DIAGNOSIS — R001 Bradycardia, unspecified: Secondary | ICD-10-CM | POA: Diagnosis not present

## 2019-11-16 NOTE — Patient Instructions (Signed)
Medication Instructions:  Your physician recommends that you continue on your current medications as directed. Please refer to the Current Medication list given to you today.  *If you need a refill on your cardiac medications before your next appointment, please call your pharmacy*   Lab Work: BMP today  If you have labs (blood work) drawn today and your tests are completely normal, you will receive your results only by: Marland Kitchen MyChart Message (if you have MyChart) OR . A paper copy in the mail If you have any lab test that is abnormal or we need to change your treatment, we will call you to review the results.   Testing/Procedures: None ordered   Follow-Up: At [facility], you and your health needs are our priority.  As part of our continuing mission to provide you with exceptional heart care, we have created designated Provider Care Teams.  These Care Teams include your primary Cardiologist (physician) and Advanced Practice Providers (APPs -  Physician Assistants and Nurse Practitioners) who all work together to provide you with the care you need, when you need it.  We recommend signing up for the patient portal called "MyChart".  Sign up information is provided on this After Visit Summary.  MyChart is used to connect with patients for Virtual Visits (Telemedicine).  Patients are able to view lab/test results, encounter notes, upcoming appointments, etc.  Non-urgent messages can be sent to your provider as well.   To learn more about what you can do with MyChart, go to [email or web address].    Your next appointment:   3 month(s)  The format for your next appointment:   In Person  Provider:   You may see Yvonne Kendall, MD or one of the following Advanced Practice Providers on your designated Care Team:    Nicolasa Ducking, NP  Eula Listen, PA-C  Marisue Ivan, PA-C  Cadence Fransico [place], [place]  Other Instructions You are being referred to Dr. Sherryl Manges, EP. Appointment to  be scheduled in 4-6 weeks.

## 2019-11-17 LAB — BASIC METABOLIC PANEL
BUN/Creatinine Ratio: 16 (ref 10–24)
BUN: 20 mg/dL (ref 8–27)
CO2: 22 mmol/L (ref 20–29)
Calcium: 9.1 mg/dL (ref 8.6–10.2)
Chloride: 103 mmol/L (ref 96–106)
Creatinine, Ser: 1.29 mg/dL — ABNORMAL HIGH (ref 0.76–1.27)
GFR calc Af Amer: 58 mL/min/{1.73_m2} — ABNORMAL LOW (ref >59)
GFR calc non Af Amer: 51 mL/min/{1.73_m2} — ABNORMAL LOW (ref >59)
Glucose: 116 mg/dL — ABNORMAL HIGH (ref 65–99)
Potassium: 4.2 mmol/L (ref 3.5–5.2)
Sodium: 141 mmol/L (ref 134–144)

## 2019-12-13 ENCOUNTER — Telehealth: Payer: Self-pay

## 2019-12-13 NOTE — Telephone Encounter (Signed)
-----   Message from Sondra Barges, PA-C sent at 12/11/2019  7:16 AM EDT ----- Heart monitor showed a predominant rhythm of sinus with an average rate of 68 bpm. Short run of fast heart beats from the bottom portion of the heart. Multiple episodes of slow heart rates and delayed electrical conduction were noted, as previously seen in the hospital Please refer to EP for further evaluation.

## 2019-12-13 NOTE — Telephone Encounter (Signed)
Patient mad aware of monitor results with verbalized understanding. Patient is scheduled to to see Dr. Graciela Husbands on 12/22/19.

## 2019-12-22 ENCOUNTER — Ambulatory Visit: Payer: Medicare Other | Admitting: Internal Medicine

## 2019-12-23 ENCOUNTER — Encounter: Payer: Self-pay | Admitting: Internal Medicine

## 2019-12-23 ENCOUNTER — Other Ambulatory Visit
Admission: RE | Admit: 2019-12-23 | Discharge: 2019-12-23 | Disposition: A | Discharge status: Home / Self Care | Payer: Medicare Other | Source: Ambulatory Visit | Attending: Internal Medicine | Admitting: Internal Medicine

## 2019-12-23 ENCOUNTER — Other Ambulatory Visit: Payer: Self-pay

## 2019-12-23 ENCOUNTER — Telehealth: Payer: Self-pay | Admitting: Internal Medicine

## 2019-12-23 ENCOUNTER — Ambulatory Visit (INDEPENDENT_AMBULATORY_CARE_PROVIDER_SITE_OTHER): Payer: Medicare Other | Admitting: Internal Medicine

## 2019-12-23 VITALS — BP 110/70 | HR 72 | Ht 71.0 in | Wt 191.0 lb

## 2019-12-23 DIAGNOSIS — I441 Atrioventricular block, second degree: Secondary | ICD-10-CM | POA: Diagnosis not present

## 2019-12-23 DIAGNOSIS — R0602 Shortness of breath: Secondary | ICD-10-CM

## 2019-12-23 DIAGNOSIS — I340 Nonrheumatic mitral (valve) insufficiency: Secondary | ICD-10-CM | POA: Diagnosis present

## 2019-12-23 DIAGNOSIS — R5383 Other fatigue: Secondary | ICD-10-CM

## 2019-12-23 DIAGNOSIS — I10 Essential (primary) hypertension: Secondary | ICD-10-CM

## 2019-12-23 LAB — CBC WITH DIFFERENTIAL/PLATELET
Abs Immature Granulocytes: 0.02 10*3/uL (ref 0.00–0.07)
Basophils Absolute: 0 10*3/uL (ref 0.0–0.1)
Basophils Relative: 1 %
Eosinophils Absolute: 0.1 10*3/uL (ref 0.0–0.5)
Eosinophils Relative: 2 %
HCT: 47.6 % (ref 39.0–52.0)
Hemoglobin: 16.8 g/dL (ref 13.0–17.0)
Immature Granulocytes: 0 %
Lymphocytes Relative: 37 %
Lymphs Abs: 2.2 10*3/uL (ref 0.7–4.0)
MCH: 32.9 pg (ref 26.0–34.0)
MCHC: 35.3 g/dL (ref 30.0–36.0)
MCV: 93.2 fL (ref 80.0–100.0)
Monocytes Absolute: 0.8 10*3/uL (ref 0.1–1.0)
Monocytes Relative: 13 %
Neutro Abs: 2.8 10*3/uL (ref 1.7–7.7)
Neutrophils Relative %: 47 %
Platelets: 148 10*3/uL — ABNORMAL LOW (ref 150–400)
RBC: 5.11 MIL/uL (ref 4.22–5.81)
RDW: 13.3 % (ref 11.5–15.5)
WBC: 6 10*3/uL (ref 4.0–10.5)
nRBC: 0 % (ref 0.0–0.2)

## 2019-12-23 LAB — COMPREHENSIVE METABOLIC PANEL
ALT: 19 U/L (ref 0–44)
AST: 20 U/L (ref 15–41)
Albumin: 3.7 g/dL (ref 3.5–5.0)
Alkaline Phosphatase: 102 U/L (ref 38–126)
Anion gap: 11 (ref 5–15)
BUN: 17 mg/dL (ref 8–23)
CO2: 26 mmol/L (ref 22–32)
Calcium: 8.7 mg/dL — ABNORMAL LOW (ref 8.9–10.3)
Chloride: 98 mmol/L (ref 98–111)
Creatinine, Ser: 1.23 mg/dL (ref 0.61–1.24)
GFR, Estimated: 58 mL/min — ABNORMAL LOW (ref >60)
Glucose, Bld: 123 mg/dL — ABNORMAL HIGH (ref 70–99)
Potassium: 4.1 mmol/L (ref 3.5–5.1)
Sodium: 135 mmol/L (ref 135–145)
Total Bilirubin: 2.4 mg/dL — ABNORMAL HIGH (ref 0.3–1.2)
Total Protein: 6.7 g/dL (ref 6.5–8.1)

## 2019-12-23 LAB — MAGNESIUM: Magnesium: 2.1 mg/dL (ref 1.7–2.4)

## 2019-12-23 MED ORDER — HYDROCHLOROTHIAZIDE 12.5 MG PO TABS
12.5000 mg | ORAL_TABLET | Freq: Every day | ORAL | 1 refills | Status: DC
Start: 2019-12-23 — End: 2020-07-12

## 2019-12-23 NOTE — Patient Instructions (Signed)
Medication Instructions:   Your physician has recommended you make the following change in your medication:  DECREASE HCTZ to 12.5mg  once daily. May take 1/2 tablet until new refill.   *If you need a refill on your cardiac medications before your next appointment, please call your pharmacy*   Lab Work:  Your physician recommends that you have labwork today: CBC w/ Diff, CMP, Magnesium.  You will need to get these at the medical mall when you leave today.   If you have labs (blood work) drawn today and your tests are completely normal, you will receive your results only by: Marland Kitchen MyChart Message (if you have MyChart) OR . A paper copy in the mail If you have any lab test that is abnormal or we need to change your treatment, we will call you to review the results.   Testing/Procedures: None ordered.   Follow-Up: At [facility], you and your health needs are our priority.  As part of our continuing mission to provide you with exceptional heart care, we have created designated Provider Care Teams.  These Care Teams include your primary Cardiologist (physician) and Advanced Practice Providers (APPs -  Physician Assistants and Nurse Practitioners) who all work together to provide you with the care you need, when you need it.  We recommend signing up for the patient portal called "MyChart".  Sign up information is provided on this After Visit Summary.  MyChart is used to connect with patients for Virtual Visits (Telemedicine).  Patients are able to view lab/test results, encounter notes, upcoming appointments, etc.  Non-urgent messages can be sent to your provider as well.   To learn more about what you can do with MyChart, go to [email or web address].    Your next appointment:   1 month(s)  The format for your next appointment:   In Person  Provider:   You may see Yvonne Kendall, MD or one of the following Advanced Practice Providers on your designated Care Team:    Nicolasa Ducking, NP  Eula Listen, PA-C  Marisue Ivan, PA-C  Cadence [place], [place]

## 2019-12-23 NOTE — Progress Notes (Signed)
Follow-up Outpatient Visit Date: 12/23/2019  Primary Care Provider: Jerl Mina, MD [address] [facility] [place] [address]  Chief Complaint: Fatigue and shortness of breath  HPI:  [name] is a 86 y.o. 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  His daughter reached out to [place] earlier today, concerned that her father was more short of breath than usual.  [name] states that he began feeling more tired yesterday.  He describes it as a sensation of not having any energy.  He may feel little bit more short of breath than usual.  He also feels "hot and cold all the time," though he denies fevers and chills.  He has not had any lightheadedness or syncope.  He also denies orthopnea, PND, edema, abdominal pain, and urinary issues.  He was scheduled to see Dr. Graciela Ramirez in the EP clinic for further evaluation of his AV block earlier this week, but the appointment was canceled by Dr. Odessa Ramirez office and rescheduled for 11/19.  --------------------------------------------------------------------------------------------------  Past Medical History:  Diagnosis Date  . Aortic stenosis   . Borderline diabetes   . Hyperlipidemia   . Hypertension   . Second degree AV block    Past Surgical History:  Procedure Laterality Date  . APPENDECTOMY    . HERNIA REPAIR    . TONSILLECTOMY      Recent CV Pertinent Labs: Lab Results  Component Value Date   K 4.1 12/23/2019   K 3.5 02/24/2014   MG 2.1 12/23/2019   BUN 17 12/23/2019   BUN 20 11/16/2019   BUN 22 (H) 10/22/2011   CREATININE 1.23 12/23/2019   CREATININE 1.30 10/22/2011    Past medical and surgical history were reviewed and updated in EPIC.  Current Meds  Medication Sig  . atorvastatin (LIPITOR) 20 MG tablet Take 20 mg by mouth every morning.  . citalopram (CELEXA) 20 MG tablet Take 20 mg by mouth daily.    . hydrochlorothiazide (HYDRODIURIL) 12.5 MG tablet Take 1 tablet (12.5 mg total) by mouth daily.  . potassium chloride SA (KLOR-CON) 20 MEQ tablet Take 20 mEq by mouth daily.  . rivastigmine (EXELON) 1.5 MG capsule Take 1.5 mg by mouth 2 (two) times daily.  . temazepam (RESTORIL) 30 MG capsule Take 30 mg by mouth at bedtime.   . [DISCONTINUED] hydrochlorothiazide (HYDRODIURIL) 25 MG tablet Take 25 mg by mouth daily.    Allergies: Patient has no known allergies.  Social History   Tobacco Use  . Smoking status: Never Smoker  . Smokeless tobacco: Never Used  Vaping Use  . Vaping Use: Never used  Substance Use Topics  . Alcohol use: Yes    Alcohol/week: 2.0 standard drinks    Types: 1 Cans of beer, 1 Shots of liquor per week  . Drug use: No    Family History  Problem Relation Age of Onset  . Congestive Heart Failure Mother   . Heart attack Mother 45  . Cirrhosis Father   . COPD Sister     Review of Systems: A 12-system review of systems was performed and was negative except as noted in the HPI.  --------------------------------------------------------------------------------------------------  Physical Exam: BP 110/70 (BP Location: Left Arm, Patient Position: Sitting, Cuff Size: Normal)   Pulse 72   Ht 5\' 11"  (1.803 m)   Wt 191 lb (86.6 kg)   SpO2 96%   BMI 26.64 kg/m  General: Elderly man, lying on exam table.  He is accompanied by his daughter. Neck: No JVD or HJR. Lungs: Clear to auscultation without wheezes or crackles. Heart: Regular rate and rhythm with 2/6 systolic murmur.  No rubs or gallops. Abdomen: Soft, nontender, nondistended. Extremities: No lower extremity edema.  Patient was ambulated around the office with rise in heart rate to 92 bpm.  He did not have any symptoms with this.  EKG: Normal sinus rhythm with left axis deviation and right bundle branch block.  No significant change from prior tracing on 11/16/2019.  Lab Results  Component Value  Date   WBC 6.0 12/23/2019   HGB 16.8 12/23/2019   HCT 47.6 12/23/2019   MCV 93.2 12/23/2019   PLT 148 (L) 12/23/2019    Lab Results  Component Value Date   NA 135 12/23/2019   K 4.1 12/23/2019   CL 98 12/23/2019   CO2 26 12/23/2019   BUN 17 12/23/2019   CREATININE 1.23 12/23/2019   GLUCOSE 123 (H) 12/23/2019   ALT 19 12/23/2019    No results found for: CHOL, HDL, LDLCALC, LDLDIRECT, TRIG, CHOLHDL  --------------------------------------------------------------------------------------------------  ASSESSMENT AND PLAN: Fatigue and shortness of breath: Symptoms are vague and not currently present.  In the past, [name] has been asymptomatic in the setting of his 2:1 AV block.  EKG today shows sinus rhythm with 1:1 AV conduction and old RBBB.  There are no acute ischemic changes.  Recent event monitor following his hospitalization multiple episodes of 2:1 AV block as well as brief episodes of Mobitz type II second-degree AV block.  He has been asymptomatic with this in the past so I am not sure that it is driving his more recent symptoms.  He has not felt lightheaded or passed out.  He also has moderate aortic stenosis by recent echo; I doubt that this has progressed significantly over the last few weeks to explain his new symptoms.  [name] appears euvolemic on examination today.  He also denies chest pain.  Myocardial perfusion stress test in 03/2018 was without evidence of ischemia, though poor exercise tolerance was evident.  I have recommended that we check a CBC, CMP, and magnesium level today.  I will try to expedite [name]'s appointment with electrophysiology (currently scheduled for 01/07/2020).  I do not see a clear indication for admission to the hospital at this time, as he demonstrated appropriate chronotropic response to exercise today.  We will continue to avoid all AV nodal blocking agents.  I have recommended decreasing HCTZ to 12.5 mg daily in case an element of  dehydration is contributing to his symptoms.  If symptoms worsen, [name] should let us know.  We may need to consider cardiac catheterization down the road if symptoms persist to definitively exclude ischemic heart disease and reassess his aortic valve gradients.  Follow-up: Return to clinic in 1 month.  Yvonne Kendall, MD 12/24/2019 7:12 AM

## 2019-12-23 NOTE — Telephone Encounter (Signed)
Pt c/o Shortness Of Breath: STAT if SOB developed within the last 24 hours or pt is noticeably SOB on the phone  1. Are you currently SOB (can you hear that pt is SOB on the phone)? daughter called in stating he is still SOB over the phone  2. How long have you been experiencing SOB? yesterday  3. Are you SOB when sitting or when up moving around? both  4. Are you currently experiencing any other symptoms? unknown

## 2019-12-23 NOTE — Telephone Encounter (Signed)
Spoke with patient's daughter.  Patient reports he's been increasingly more short of breath over the past 1-2 days. His voice is "low" as well which was something he experienced his last hospitalization. Denies swelling, chest pain, dizziness. Reports no energy which has been ongoing over the past several months. Earlier, when riding in a car with a friend, he kept having to roll the window done so he could breathe and get fresh air. Then he would get cold and have to roll it up. She says one minute he's hot the next minute he's cold.  She has not been to see him yet today. Advised her to take temperature to make sure he does not have a fever and check his O2 stat and BP. She is on her way to see him now. If no fever and O2 greater than 92%, they will proceed with coming for appointment this afternoon with Dr End. Advised if patient's O2 is less than 92 % or patient has fever, then appointment is not advised. She should call me back to reassess as PCP or ER may be the more appropriate plan of care. She verbalized understanding.

## 2019-12-24 ENCOUNTER — Encounter: Payer: Self-pay | Admitting: Internal Medicine

## 2019-12-24 DIAGNOSIS — I441 Atrioventricular block, second degree: Secondary | ICD-10-CM | POA: Insufficient documentation

## 2020-01-06 NOTE — Progress Notes (Signed)
ELECTROPHYSIOLOGY CONSULT NOTE  Patient ID: Timothy Ramirez, MRN: [id number], DOB/AGE: 84-Dec-1935 84 y.o. Admit date: (Not on file) Date of Consult: 01/07/2020  Primary Physician: Jerl Mina, MD Primary Cardiologist: CE      Timothy Ramirez is a 86 y.o. male who is being seen today for the evaluation of bradycardia at the request of Dr End.    HPI Timothy Ramirez is a 86 y.o. male seen for fatigue and episodic dyspnea.  He was seen in hospital with symptoms of constipation and jitteriness.  He was noted to have episodic 2: 1 heart block and underwent event recorder which is outlined below.  He has dyspnea which is unpredictable and episodic, i.e. some days he can do things that he otherwise struggles with although he is largely not ambulating, sits in a chair a lot and sleeps a lot.  No chest pain.  DATE TEST EF   10/21 MYOVIEW   No perfusion abnormality  9/21 Echo   60-65 % AS mod - Mean Grad 29  Estimated AVA  1.36        Date Cr K Hgb  11/21 1..23 4.1 16.8         Event recorder 10/21 VT NS 2:1 AVblock with MBZ 1    Past Medical History:  Diagnosis Date  . Aortic stenosis   . Borderline diabetes   . Hyperlipidemia   . Hypertension   . Second degree AV block       Surgical History:  Past Surgical History:  Procedure Laterality Date  . APPENDECTOMY    . HERNIA REPAIR    . TONSILLECTOMY       Home Meds: Current Meds  Medication Sig  . atorvastatin (LIPITOR) 20 MG tablet Take 20 mg by mouth every morning.  . citalopram (CELEXA) 20 MG tablet Take 20 mg by mouth daily.  . hydrochlorothiazide (HYDRODIURIL) 12.5 MG tablet Take 1 tablet (12.5 mg total) by mouth daily.  . potassium chloride SA (KLOR-CON) 20 MEQ tablet Take 20 mEq by mouth daily.  . rivastigmine (EXELON) 1.5 MG capsule Take 1.5 mg by mouth 2 (two) times daily.  . temazepam (RESTORIL) 30 MG capsule Take 30 mg by mouth at bedtime.     Allergies: No Known Allergies  Social History    Socioeconomic History  . Marital status: Widowed    Spouse name: Not on file  . Number of children: Not on file  . Years of education: Not on file  . Highest education level: Not on file  Occupational History  . Not on file  Tobacco Use  . Smoking status: Never Smoker  . Smokeless tobacco: Never Used  Vaping Use  . Vaping Use: Never used  Substance and Sexual Activity  . Alcohol use: Yes    Alcohol/week: 2.0 standard drinks    Types: 1 Cans of beer, 1 Shots of liquor per week  . Drug use: No  . Sexual activity: Not Currently  Other Topics Concern  . Not on file  Social History Narrative  . Not on file   Social Determinants of Health   Financial Resource Strain:   . Difficulty of Paying Living Expenses: Not on file  Food Insecurity:   . Worried About Programme researcher, broadcasting/film/video in the Last Year: Not on file  . Ran Out of Food in the Last Year: Not on file  Transportation Needs:   . Lack of Transportation (Medical): Not on file  . Lack  of Transportation (Non-Medical): Not on file  Physical Activity:   . Days of Exercise per Week: Not on file  . Minutes of Exercise per Session: Not on file  Stress:   . Feeling of Stress : Not on file  Social Connections:   . Frequency of Communication with Friends and Family: Not on file  . Frequency of Social Gatherings with Friends and Family: Not on file  . Attends Religious Services: Not on file  . Active Member of Clubs or Organizations: Not on file  . Attends Banker Meetings: Not on file  . Marital Status: Not on file  Intimate Partner Violence:   . Fear of Current or Ex-Partner: Not on file  . Emotionally Abused: Not on file  . Physically Abused: Not on file  . Sexually Abused: Not on file     Family History  Problem Relation Age of Onset  . Congestive Heart Failure Mother   . Heart attack Mother 71  . Cirrhosis Father   . COPD Sister      ROS:  Please see the history of present illness.     All other systems  reviewed and negative.    Physical Exam: Blood pressure (!) 148/80, pulse 86, height 5\' 11"  (1.803 m), weight 194 lb 6.4 oz (88.2 kg), SpO2 96 %. General: Well developed, well nourished male in no acute distress. Head: Normocephalic, atraumatic, sclera non-icteric, no xanthomas, nares are without discharge. EENT: normal  Lymph Nodes:  none Neck: Negative for carotid bruits. JVD not elevated. Back:without scoliosis kyphosis Lungs: Clear bilaterally to auscultation without wheezes, rales, or rhonchi. Breathing is unlabored. Heart: RRR with S1 S2.  3/6 systolic murmur . No rubs, or gallops appreciated. Abdomen: Soft, non-tender, non-distended with normoactive bowel sounds. No hepatomegaly. No rebound/guarding. No obvious abdominal masses. Msk:  Strength and tone appear normal for age. Extremities: No clubbing or cyanosis. No edema.  Distal pedal pulses are 2+ and equal bilaterally. Skin: Warm and Dry Neuro: Alert and oriented X 3. CN III-XII intact Grossly normal sensory and motor function . Psych:  Responds to questions appropriately with a normal affect.      Labs: Cardiac Enzymes No results for input(s): CKTOTAL, CKMB, TROPONINI in the last 72 hours. CBC Lab Results  Component Value Date   WBC 6.0 12/23/2019   HGB 16.8 12/23/2019   HCT 47.6 12/23/2019   MCV 93.2 12/23/2019   PLT 148 (L) 12/23/2019   PROTIME: No results for input(s): LABPROT, INR in the last 72 hours. Chemistry No results for input(s): NA, K, CL, CO2, BUN, CREATININE, CALCIUM, PROT, BILITOT, ALKPHOS, ALT, AST, GLUCOSE in the last 168 hours.  Invalid input(s): LABALBU Lipids No results found for: CHOL, HDL, LDLCALC, TRIG BNP No results found for: PROBNP Thyroid Function Tests: No results for input(s): TSH, T4TOTAL, T3FREE, THYROIDAB in the last 72 hours.  Invalid input(s): FREET3 Miscellaneous No results found for: DDIMER  Radiology/Studies:  No results found.  EKG: Sinus at 82 Interval  17/13/39 Right bundle branch block/left axis deviation  Resting heart rate 96.  Walking to the door heart rate went to 66 without symptoms.  Repeat walk to the door heart rate stayed at 96 or so with worsening symptoms   Assessment and Plan:  Dyspnea on exertion-intermittent  Mobitz 2 first-degree heart block with daytime rates in the high 30s low 40s  Aortic stenosis-moderate    The patient has intermittent dyspnea and intermittent heart block.  It is certainly possible that his  dyspnea is a consequence of bradycardia although we have been unable to prove that for sure.  We discussed the potential role of pacing for the relief of bradycardia with the presumption that his dyspnea, if it were related to his aortic stenosis, would be less unpredictable and thus much more likely to be related to his heart block/bradycardia.  However, his lifestyle is not such that he is all that ambulating anyway.  Hence, the value of pacing which would be for the relief of symptoms would be predicated on the likelihood that his effort would change if his tolerance improved.  His daughter will discuss this with him over the weekend and then let us know.          Sherryl Manges

## 2020-01-07 ENCOUNTER — Ambulatory Visit (INDEPENDENT_AMBULATORY_CARE_PROVIDER_SITE_OTHER): Payer: Medicare Other | Admitting: Internal Medicine

## 2020-01-07 ENCOUNTER — Other Ambulatory Visit: Payer: Self-pay

## 2020-01-07 ENCOUNTER — Encounter: Payer: Self-pay | Admitting: Internal Medicine

## 2020-01-07 VITALS — BP 148/80 | HR 86 | Ht 71.0 in | Wt 194.4 lb

## 2020-01-07 DIAGNOSIS — I441 Atrioventricular block, second degree: Secondary | ICD-10-CM | POA: Diagnosis not present

## 2020-01-07 NOTE — Patient Instructions (Signed)
Medication Instructions:  °Your physician recommends that you continue on your current medications as directed. Please refer to the Current Medication list given to you today. ° ° °*If you need a refill on your cardiac medications before your next appointment, please call your pharmacy* ° ° °Lab Work: °None ordered. ° °If you have labs (blood work) drawn today and your tests are completely normal, you will receive your results only by: °MyChart Message (if you have MyChart) OR °A paper copy in the mail °If you have any lab test that is abnormal or we need to change your treatment, we will call you to review the results. ° ° °Testing/Procedures: °None ordered. ° ° ° °Follow-Up: °At [facility], you and your health needs are our priority.  As part of our continuing mission to provide you with exceptional heart care, we have created designated Provider Care Teams.  These Care Teams include your primary Cardiologist (physician) and Advanced Practice Providers (APPs -  Physician Assistants and Nurse Practitioners) who all work together to provide you with the care you need, when you need it. ° °We recommend signing up for the patient portal called "MyChart".  Sign up information is provided on this After Visit Summary.  MyChart is used to connect with patients for Virtual Visits (Telemedicine).  Patients are able to view lab/test results, encounter notes, upcoming appointments, etc.  Non-urgent messages can be sent to your provider as well.   °To learn more about what you can do with MyChart, go to [email or web address].   ° °Your next appointment:   °To be determined °

## 2020-01-19 DIAGNOSIS — I2510 Atherosclerotic heart disease of native coronary artery without angina pectoris: Secondary | ICD-10-CM

## 2020-01-19 HISTORY — DX: Atherosclerotic heart disease of native coronary artery without angina pectoris: I25.10

## 2020-01-26 ENCOUNTER — Ambulatory Visit (INDEPENDENT_AMBULATORY_CARE_PROVIDER_SITE_OTHER): Payer: Medicare Other | Admitting: Internal Medicine

## 2020-01-26 ENCOUNTER — Encounter: Payer: Self-pay | Admitting: Internal Medicine

## 2020-01-26 ENCOUNTER — Other Ambulatory Visit: Payer: Self-pay

## 2020-01-26 VITALS — BP 140/80 | HR 60 | Ht 71.0 in | Wt 194.0 lb

## 2020-01-26 DIAGNOSIS — R0602 Shortness of breath: Secondary | ICD-10-CM

## 2020-01-26 DIAGNOSIS — I441 Atrioventricular block, second degree: Secondary | ICD-10-CM

## 2020-01-26 DIAGNOSIS — I350 Nonrheumatic aortic (valve) stenosis: Secondary | ICD-10-CM | POA: Diagnosis not present

## 2020-01-26 DIAGNOSIS — Z01810 Encounter for preprocedural cardiovascular examination: Secondary | ICD-10-CM | POA: Diagnosis not present

## 2020-01-26 NOTE — H&P (View-Only) (Signed)
 Follow-up Outpatient Visit Date: 01/26/2020  Primary Care Provider: Hedrick, James, MD [address] [facility] [place] [address]  Chief Complaint: Follow-up aortic stenosis and heart block  HPI:  Timothy Ramirez is a 86 y.o. 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  I last saw him in early November for urgent evaluation of shortness of breath and fatigue.  EP consultation regarding second degree AV block was pending.  Mr. Vinluan was evaluated by Dr. Klein on 01/07/2020, at which pacemaker was discussed.  The patient ultimately decided to defer pacemaker placement and presents today for reassessment of his symptoms and further discussion of his aortic stenosis.  Today, Mr. Sipe reports that he is feeling relatively well.  He has not had any further episodes of shortness of breath, though he admits to being fairly sedentary.  Previously, he would have sudden onset of dyspnea such as when riding in the car.  This has not happened since our last visit.  He also denies chest pain, palpitations, lightheadedness, and edema.  --------------------------------------------------------------------------------------------------  Past Medical History:  Diagnosis Date  . Aortic stenosis   . Borderline diabetes   . Hyperlipidemia   . Hypertension   . Second degree AV block    Past Surgical History:  Procedure Laterality Date  . APPENDECTOMY    . HERNIA REPAIR    . TONSILLECTOMY      Current Meds  Medication Sig  . atorvastatin (LIPITOR) 20 MG tablet Take 20 mg by mouth every morning.  . citalopram (CELEXA) 20 MG tablet Take 20 mg by mouth daily.  . hydrochlorothiazide (HYDRODIURIL) 12.5 MG tablet Take 1 tablet (12.5 mg total) by mouth daily.  . hydrochlorothiazide (HYDRODIURIL) 25 MG tablet Take 25 mg by mouth daily.  . potassium chloride SA (KLOR-CON) 20 MEQ tablet Take 20 mEq  by mouth daily.  . rivastigmine (EXELON) 1.5 MG capsule Take 1.5 mg by mouth 2 (two) times daily.  . temazepam (RESTORIL) 30 MG capsule Take 30 mg by mouth at bedtime.     Allergies: Patient has no known allergies.  Social History   Tobacco Use  . Smoking status: Never Smoker  . Smokeless tobacco: Never Used  Vaping Use  . Vaping Use: Never used  Substance Use Topics  . Alcohol use: Yes    Alcohol/week: 2.0 standard drinks    Types: 1 Cans of beer, 1 Shots of liquor per week  . Drug use: No    Family History  Problem Relation Age of Onset  . Congestive Heart Failure Mother   . Heart attack Mother 89  . Cirrhosis Father   . COPD Sister     Review of Systems: A 12-system review of systems was performed and was negative except as noted in the HPI.  --------------------------------------------------------------------------------------------------  Physical Exam: BP 140/80 (BP Location: Left Arm, Patient Position: Sitting, Cuff Size: Normal)   Pulse 60   Ht 5' 11" (1.803 m)   Wt 194 lb (88 kg)   SpO2 92%   BMI 27.06 kg/m   General: NAD. Neck: No JVD or HJR. Lungs: Clear to auscultation bilaterally without wheezes or crackles. Heart: Regular rate and rhythm with 2/6 systolic murmur.  No rubs or gallops. Abdomen: Soft, nontender, nondistended. Extremities: No lower extremity edema.  EKG: Normal sinus rhythm with right bundle branch block and nonspecific T wave changes.  Compared with prior tracing on 01/07/2020, nonspecific T wave   changes are now present.  Lab Results  Component Value Date   WBC 6.0 12/23/2019   HGB 16.8 12/23/2019   HCT 47.6 12/23/2019   MCV 93.2 12/23/2019   PLT 148 (L) 12/23/2019    Lab Results  Component Value Date   NA 135 12/23/2019   K 4.1 12/23/2019   CL 98 12/23/2019   CO2 26 12/23/2019   BUN 17 12/23/2019   CREATININE 1.23 12/23/2019   GLUCOSE 123 (H) 12/23/2019   ALT 19 12/23/2019    No results found for: CHOL, HDL,  LDLCALC, LDLDIRECT, TRIG, CHOLHDL  --------------------------------------------------------------------------------------------------  ASSESSMENT AND PLAN: Shortness of breath, aortic stenosis, and 2:1 AV block: Mr. Haselton reports minimal symptoms today, though he is relatively sedentary.  Notably, he has not had any random episodes of dyspnea, as he and his daughter described at his last visit.  Given relatively transient nature of his symptoms, I would favor this representing an arrhythmia, given that I would expect aortic stenosis to be more consistently symptomatic with activity.  We discussed the rationale for aortic valve replacement and pacemaker insertion.  Thus far, we have not excluded underlying ischemia.  We discussed continued conservative management and close follow-up, cardiac catheterization to assess for CAD as well as further evaluate degree of aortic stenosis, and referral to the valve clinic for further discussion of aortic valve disease.  Mr. Edelson and his daughter wish to proceed with catheterization in early January.  I have reviewed the risks, indications, and alternatives to cardiac catheterization, possible angioplasty, and stenting with the patient. Risks include but are not limited to bleeding, infection, vascular injury, stroke, myocardial infection, arrhythmia, kidney injury, radiation-related injury in the case of prolonged fluoroscopy use, emergency cardiac surgery, and death. The patient understands the risks of serious complication is 1-2 in 1000 with diagnostic cardiac cath and 1-2% or less with angioplasty/stenting.  In the meantime, I will have Mr. Inda begin taking aspirin 81 mg daily.  We will defer other medication changes at this time.  He should contact us if he has any worsening symptoms.  Follow-up: Return to clinic 2 weeks after catheterization.  Yvonne Kendall, MD 01/26/2020 11:05 AM

## 2020-01-26 NOTE — Progress Notes (Signed)
Follow-up Outpatient Visit Date: 01/26/2020  Primary Care Provider: Jerl Mina, MD [address] [facility] [place] [address]  Chief Complaint: Follow-up aortic stenosis and heart block  HPI:  Mr. Orsak is a 86 y.o. 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  I last saw him in early November for urgent evaluation of shortness of breath and fatigue.  EP consultation regarding second degree AV block was pending.  Mr. Guymon was evaluated by Dr. Graciela Husbands on 01/07/2020, at which pacemaker was discussed.  The patient ultimately decided to defer pacemaker placement and presents today for reassessment of his symptoms and further discussion of his aortic stenosis.  Today, Mr. Coleson reports that he is feeling relatively well.  He has not had any further episodes of shortness of breath, though he admits to being fairly sedentary.  Previously, he would have sudden onset of dyspnea such as when riding in the car.  This has not happened since our last visit.  He also denies chest pain, palpitations, lightheadedness, and edema.  --------------------------------------------------------------------------------------------------  Past Medical History:  Diagnosis Date  . Aortic stenosis   . Borderline diabetes   . Hyperlipidemia   . Hypertension   . Second degree AV block    Past Surgical History:  Procedure Laterality Date  . APPENDECTOMY    . HERNIA REPAIR    . TONSILLECTOMY      Current Meds  Medication Sig  . atorvastatin (LIPITOR) 20 MG tablet Take 20 mg by mouth every morning.  . citalopram (CELEXA) 20 MG tablet Take 20 mg by mouth daily.  . hydrochlorothiazide (HYDRODIURIL) 12.5 MG tablet Take 1 tablet (12.5 mg total) by mouth daily.  . hydrochlorothiazide (HYDRODIURIL) 25 MG tablet Take 25 mg by mouth daily.  . potassium chloride SA (KLOR-CON) 20 MEQ tablet Take 20 mEq  by mouth daily.  . rivastigmine (EXELON) 1.5 MG capsule Take 1.5 mg by mouth 2 (two) times daily.  . temazepam (RESTORIL) 30 MG capsule Take 30 mg by mouth at bedtime.     Allergies: Patient has no known allergies.  Social History   Tobacco Use  . Smoking status: Never Smoker  . Smokeless tobacco: Never Used  Vaping Use  . Vaping Use: Never used  Substance Use Topics  . Alcohol use: Yes    Alcohol/week: 2.0 standard drinks    Types: 1 Cans of beer, 1 Shots of liquor per week  . Drug use: No    Family History  Problem Relation Age of Onset  . Congestive Heart Failure Mother   . Heart attack Mother 69  . Cirrhosis Father   . COPD Sister     Review of Systems: A 12-system review of systems was performed and was negative except as noted in the HPI.  --------------------------------------------------------------------------------------------------  Physical Exam: BP 140/80 (BP Location: Left Arm, Patient Position: Sitting, Cuff Size: Normal)   Pulse 60   Ht 5\' 11"  (1.803 m)   Wt 194 lb (88 kg)   SpO2 92%   BMI 27.06 kg/m   General: NAD. Neck: No JVD or HJR. Lungs: Clear to auscultation bilaterally without wheezes or crackles. Heart: Regular rate and rhythm with 2/6 systolic murmur.  No rubs or gallops. Abdomen: Soft, nontender, nondistended. Extremities: No lower extremity edema.  EKG: Normal sinus rhythm with right bundle branch block and nonspecific T wave changes.  Compared with prior tracing on 01/07/2020, nonspecific T wave  changes are now present.  Lab Results  Component Value Date   WBC 6.0 12/23/2019   HGB 16.8 12/23/2019   HCT 47.6 12/23/2019   MCV 93.2 12/23/2019   PLT 148 (L) 12/23/2019    Lab Results  Component Value Date   NA 135 12/23/2019   K 4.1 12/23/2019   CL 98 12/23/2019   CO2 26 12/23/2019   BUN 17 12/23/2019   CREATININE 1.23 12/23/2019   GLUCOSE 123 (H) 12/23/2019   ALT 19 12/23/2019    No results found for: CHOL, HDL,  LDLCALC, LDLDIRECT, TRIG, CHOLHDL  --------------------------------------------------------------------------------------------------  ASSESSMENT AND PLAN: Shortness of breath, aortic stenosis, and 2:1 AV block: Mr. Haselton reports minimal symptoms today, though he is relatively sedentary.  Notably, he has not had any random episodes of dyspnea, as he and his daughter described at his last visit.  Given relatively transient nature of his symptoms, I would favor this representing an arrhythmia, given that I would expect aortic stenosis to be more consistently symptomatic with activity.  We discussed the rationale for aortic valve replacement and pacemaker insertion.  Thus far, we have not excluded underlying ischemia.  We discussed continued conservative management and close follow-up, cardiac catheterization to assess for CAD as well as further evaluate degree of aortic stenosis, and referral to the valve clinic for further discussion of aortic valve disease.  Mr. Edelson and his daughter wish to proceed with catheterization in early January.  I have reviewed the risks, indications, and alternatives to cardiac catheterization, possible angioplasty, and stenting with the patient. Risks include but are not limited to bleeding, infection, vascular injury, stroke, myocardial infection, arrhythmia, kidney injury, radiation-related injury in the case of prolonged fluoroscopy use, emergency cardiac surgery, and death. The patient understands the risks of serious complication is 1-2 in 1000 with diagnostic cardiac cath and 1-2% or less with angioplasty/stenting.  In the meantime, I will have Mr. Inda begin taking aspirin 81 mg daily.  We will defer other medication changes at this time.  He should contact us if he has any worsening symptoms.  Follow-up: Return to clinic 2 weeks after catheterization.  Yvonne Kendall, MD 01/26/2020 11:05 AM

## 2020-01-26 NOTE — Patient Instructions (Signed)
Medication Instructions:  Your physician has recommended you make the following change in your medication:  1- START Aspirin 81 mg by mouth once a day.  *If you need a refill on your cardiac medications before your next appointment, please call your pharmacy*  Lab Work: 1 Your physician recommends that you return for lab work on January 3rd, 2022 prior to getting COVID test. - CBC, BMET. - Please go to the [facility]. You will check in at the front desk to the right as you walk into the atrium. Valet Parking is offered if needed. - No appointment needed. You may go any day between 7 am and 6 pm.   2- COVID PRE- TEST: You will need a COVID TEST prior to the procedure:  LOCATION: [facility]Thru Testing site.  DATE/TIME:  Monday, February 21, 2020 anytime between 8 am and 1 pm.  If you have labs (blood work) drawn today and your tests are completely normal, you will receive your results only by: Marland Kitchen MyChart Message (if you have MyChart) OR . A paper copy in the mail If you have any lab test that is abnormal or we need to change your treatment, we will call you to review the results.   Testing/Procedures:  You are scheduled for a RIGHT and LEFT Cardiac Catheterization on Wednesday, January 5 with Dr. Cristal [place].  1. Please arrive at the [facility] at 6:30 AM (This time is one hour before your procedure to ensure your preparation). Free valet parking service is available.   Special note: Every effort is made to have your procedure done on time. Please understand that emergencies sometimes delay scheduled procedures.  2. Diet: Do not eat solid foods after midnight.  The patient may have clear liquids until 5am upon the day of the procedure.  3. Labs: You will need to have blood drawn on Monday, January 3 at [facility], Go to 1st desk on your right to register.  Address: [address] [place], [place] [address]  Open: 8am - 5pm  Phone:  [phone]. You do not need to be fasting.  4. Medication instructions in preparation for your procedure:   Contrast Allergy: No  Do not take Hydrochlorothiazide the morning of your procedure.  On the morning of your procedure, take your Aspirin and any morning medicines NOT listed above.  You may use sips of water.  5. Plan for one night stay--bring personal belongings. 6. Bring a current list of your medications and current insurance cards. 7. You MUST have a responsible person to drive you home. 8. Someone MUST be with you the first 24 hours after you arrive home or your discharge will be delayed. 9. Please wear clothes that are easy to get on and off and wear slip-on shoes.  Thank you for allowing [place] to care for you!   -- [facility]    Follow-Up: At [facility], you and your health needs are our priority.  As part of our continuing mission to provide you with exceptional heart care, we have created designated Provider Care Teams.  These Care Teams include your primary Cardiologist (physician) and Advanced Practice Providers (APPs -  Physician Assistants and Nurse Practitioners) who all work together to provide you with the care you need, when you need it.  We recommend signing up for the patient portal called "MyChart".  Sign up information is provided on this After Visit Summary.  MyChart is  used to connect with patients for Virtual Visits (Telemedicine).  Patients are able to view lab/test results, encounter notes, upcoming appointments, etc.  Non-urgent messages can be sent to your provider as well.   To learn more about what you can do with MyChart, go to [email or web address].    Your next appointment:   2 week(s) after cath which is schedule for 02/23/2020.  The format for your next appointment:   In Person  Provider:   You may see Yvonne Kendall, MD or one of the following Advanced Practice Providers on your designated Care Team:     Nicolasa Ducking, NP  Eula Listen, PA-C  Marisue Ivan, PA-C  Cadence Fransico Michael, [place]  Gillian Shields, NP    Coronary Angiogram With Stent Coronary angiogram with stent placement is a procedure to widen or open a narrow blood vessel of the heart (coronary artery). Arteries may become blocked by cholesterol buildup (plaques) in the lining of the artery wall. When a coronary artery becomes partially blocked, blood flow to that area decreases. This may lead to chest pain or a heart attack (myocardial infarction). A stent is a small piece of metal that looks like mesh or spring. Stent placement may be done as treatment after a heart attack, or to prevent a heart attack if a blocked artery is found by a coronary angiogram. Let your health care provider know about:  Any allergies you have, including allergies to medicines or contrast dye.  All medicines you are taking, including vitamins, herbs, eye drops, creams, and over-the-counter medicines.  Any problems you or family members have had with anesthetic medicines.  Any blood disorders you have.  Any surgeries you have had.  Any medical conditions you have, including kidney problems or kidney failure.  Whether you are pregnant or may be pregnant.  Whether you are breastfeeding. What are the risks? Generally, this is a safe procedure. However, serious problems may occur, including:  Damage to nearby structures or organs, such as the heart, blood vessels, or kidneys.  A return of blockage.  Bleeding, infection, or bruising at the insertion site.  A collection of blood under the skin (hematoma) at the insertion site.  A blood clot in another part of the body.  Allergic reaction to medicines or dyes.  Bleeding into the abdomen (retroperitoneal bleeding).  Stroke (rare).  Heart attack (rare). What happens before the procedure? Staying hydrated Follow instructions from your health care provider about hydration, which may  include:  Up to 2 hours before the procedure - you may continue to drink clear liquids, such as water, clear fruit juice, black coffee, and plain tea.  Eating and drinking restrictions Follow instructions from your health care provider about eating and drinking, which may include:  8 hours before the procedure - stop eating heavy meals or foods, such as meat, fried foods, or fatty foods.  6 hours before the procedure - stop eating light meals or foods, such as toast or cereal.  2 hours before the procedure - stop drinking clear liquids. Medicines Ask your health care provider about:  Changing or stopping your regular medicines. This is especially important if you are taking diabetes medicines or blood thinners.  Taking medicines such as aspirin and ibuprofen. These medicines can thin your blood. Do not take these medicines unless your health care provider tells you to take them. ? Generally, aspirin is recommended before a thin tube, called a catheter, is passed through a blood vessel and inserted into the heart (cardiac  catheterization).  Taking over-the-counter medicines, vitamins, herbs, and supplements. General instructions  Do not use any products that contain nicotine or tobacco for at least 4 weeks before the procedure. These products include cigarettes, e-cigarettes, and chewing tobacco. If you need help quitting, ask your health care provider.  Plan to have someone take you home from the hospital or clinic.  If you will be going home right after the procedure, plan to have someone with you for 24 hours.  You may have tests and imaging procedures.  Ask your health care provider: ? How your insertion site will be marked. Ask which artery will be used for the procedure. ? What steps will be taken to help prevent infection. These may include:  Removing hair at the insertion site.  Washing skin with a germ-killing soap.  Taking antibiotic medicine. What happens during the  procedure?   An IV will be inserted into one of your veins.  Electrodes may be placed on your chest to monitor your heart rate during the procedure.  You will be given one or more of the following: ? A medicine to help you relax (sedative). ? A medicine to numb the area (local anesthetic) for catheter insertion.  A small incision will be made for catheter insertion.  The catheter will be inserted into an artery using a guide wire. The location may be in your groin, your wrist, or the fold of your arm (near your elbow).  An X-ray procedure (fluoroscopy) will be used to help guide the catheter to the opening of the heart arteries.  A dye will be injected into the catheter. X-rays will be taken. The dye helps to show where any narrowing or blockages are located in the arteries.  Tell your health care provider if you have chest pain or trouble breathing.  A tiny wire will be guided to the blocked spot, and a balloon will be inflated to make the artery wider.  The stent will be expanded to crush the plaques into the wall of the vessel. The stent will hold the area open and improve the blood flow. Most stents have a drug coating to reduce the risk of the stent narrowing over time.  The artery may be made wider using a drill, laser, or other tools that remove plaques.  The catheter will be removed when the blood flow improves. The stent will stay where it was placed, and the lining of the artery will grow over it.  A bandage (dressing) will be placed on the insertion site. Pressure will be applied to stop bleeding.  The IV will be removed. This procedure may vary among health care providers and hospitals. What happens after the procedure?  Your blood pressure, heart rate, breathing rate, and blood oxygen level will be monitored until you leave the hospital or clinic.  If the procedure is done through the leg, you will lie flat in bed for a few hours or for as long as told by your health  care provider. You will be instructed not to bend or cross your legs.  The insertion site and the pulse in your foot or wrist will be checked often.  You may have more blood tests, X-rays, and a test that records the electrical activity of your heart (electrocardiogram, or ECG).  Do not drive for 24 hours if you were given a sedative during your procedure. Summary  Coronary angiogram with stent placement is a procedure to widen or open a narrowed coronary artery. This is  done to treat heart problems.  Before the procedure, let your health care provider know about all the medical conditions and surgeries you have or have had.  This is a safe procedure. However, some problems may occur, including damage to nearby structures or organs, bleeding, blood clots, or allergies.  Follow your health care provider's instructions about eating, drinking, medicines, and other lifestyle changes, such as quitting tobacco use before the procedure. This information is not intended to replace advice given to you by your health care provider. Make sure you discuss any questions you have with your health care provider. Document Revised: 08/26/2018 Document Reviewed: 08/26/2018 Elsevier Patient Education  2020 [employer]

## 2020-01-28 ENCOUNTER — Telehealth: Payer: Self-pay | Admitting: Internal Medicine

## 2020-01-28 NOTE — Telephone Encounter (Signed)
Pt c/o Shortness Of Breath: STAT if SOB developed within the last 24 hours or pt is noticeably SOB on the phone  1. Are you currently SOB (can you hear that pt is SOB on the phone)? Yes, patient daughter states patient is "gasping for his breath"  2. How long have you been experiencing SOB? Just started about 15 minutes ago  3. Are you SOB when sitting or when up moving around? Up moving around, but sitting now and still having the problem.  4. Are you currently experiencing any other symptoms? No other symptoms

## 2020-01-28 NOTE — Telephone Encounter (Signed)
Patient's daughter calling. Reports patient had stomach bug about 2 days ago with nausea, vomiting and diarrhea. Yesterday, pulse was around 47 so she called PCP. PCP was not concerned with HR but more concerned if patient experiences confusion and decreased urinary output.  PCP instructed them to call back tomorrow (which is today) if not improvement or worsening symptoms.  Yesterday, daughter was with patient about 8 hours and he only voided once and did not drink much. Last night, daughter states patient was confused. States he "sounded like he was out in left field."  This morning, patient got up to go to restaurant for breakfast. As he went to take his first bite, he began gasping for air so the person with him called the daughter.  In reviewing PCP note from yesterday and the information provided by daughter, Advised her that patient should go to the ER or call 911.  We discussed he could be dehydrated and does need evaluation and possibly fluids today. She should also call the PCP for further advice as they suggested for her to do if he worsened today. She verbalized understanding.

## 2020-01-31 NOTE — Telephone Encounter (Signed)
Patient daughter calling back  He is still having SOB issues  Would like to know if he should move cath appt up sooner Please call to discuss

## 2020-01-31 NOTE — Telephone Encounter (Signed)
I think it would be reasonable to move cath up sooner, particularly if he continues to have shortness of breath.  I could do cath in [place] on Thursday or Friday.  I am not able to perform cath in [place] until after Christmas.  In any case, he will need pre-cath labs drawn before the procedure.  Yvonne Kendall, MD [facility]

## 2020-01-31 NOTE — Telephone Encounter (Signed)
Patient's daughter calling back to check on status. Says patient is ok right now but she is concerned because he had not had one of these breathing episodes in a while and just had this one last week. That is why they had elected to wait until after Christmas for the cath. Now she would like to know if Dr End thinks it should be done sooner or should they f/u with pulmonary for the cause of the shortness of breath. Advised her I will update Dr End and let her know his recommendations.  She was appreciative.

## 2020-02-01 ENCOUNTER — Other Ambulatory Visit
Admission: RE | Admit: 2020-02-01 | Discharge: 2020-02-01 | Disposition: A | Discharge status: Home / Self Care | Payer: Medicare Other | Source: Ambulatory Visit | Attending: Internal Medicine | Admitting: Internal Medicine

## 2020-02-01 ENCOUNTER — Other Ambulatory Visit: Payer: Self-pay

## 2020-02-01 ENCOUNTER — Other Ambulatory Visit
Admission: RE | Admit: 2020-02-01 | Discharge: 2020-02-01 | Disposition: A | Discharge status: Home / Self Care | Payer: Medicare Other | Attending: Internal Medicine | Admitting: Internal Medicine

## 2020-02-01 DIAGNOSIS — R0602 Shortness of breath: Secondary | ICD-10-CM

## 2020-02-01 DIAGNOSIS — Z20822 Contact with and (suspected) exposure to covid-19: Secondary | ICD-10-CM | POA: Insufficient documentation

## 2020-02-01 DIAGNOSIS — I350 Nonrheumatic aortic (valve) stenosis: Secondary | ICD-10-CM | POA: Diagnosis present

## 2020-02-01 DIAGNOSIS — Z01818 Encounter for other preprocedural examination: Secondary | ICD-10-CM | POA: Diagnosis not present

## 2020-02-01 DIAGNOSIS — Z01810 Encounter for preprocedural cardiovascular examination: Secondary | ICD-10-CM

## 2020-02-01 LAB — CBC WITH DIFFERENTIAL/PLATELET
Abs Immature Granulocytes: 0.03 10*3/uL (ref 0.00–0.07)
Basophils Absolute: 0.1 10*3/uL (ref 0.0–0.1)
Basophils Relative: 1 %
Eosinophils Absolute: 0.1 10*3/uL (ref 0.0–0.5)
Eosinophils Relative: 1 %
HCT: 48.5 % (ref 39.0–52.0)
Hemoglobin: 17.5 g/dL — ABNORMAL HIGH (ref 13.0–17.0)
Immature Granulocytes: 0 %
Lymphocytes Relative: 38 %
Lymphs Abs: 3.3 10*3/uL (ref 0.7–4.0)
MCH: 33 pg (ref 26.0–34.0)
MCHC: 36.1 g/dL — ABNORMAL HIGH (ref 30.0–36.0)
MCV: 91.3 fL (ref 80.0–100.0)
Monocytes Absolute: 0.8 10*3/uL (ref 0.1–1.0)
Monocytes Relative: 9 %
Neutro Abs: 4.4 10*3/uL (ref 1.7–7.7)
Neutrophils Relative %: 51 %
Platelets: 165 10*3/uL (ref 150–400)
RBC: 5.31 MIL/uL (ref 4.22–5.81)
RDW: 12.1 % (ref 11.5–15.5)
WBC: 8.7 10*3/uL (ref 4.0–10.5)
nRBC: 0 % (ref 0.0–0.2)

## 2020-02-01 LAB — BASIC METABOLIC PANEL
Anion gap: 11 (ref 5–15)
BUN: 21 mg/dL (ref 8–23)
CO2: 25 mmol/L (ref 22–32)
Calcium: 8.9 mg/dL (ref 8.9–10.3)
Chloride: 103 mmol/L (ref 98–111)
Creatinine, Ser: 1.16 mg/dL (ref 0.61–1.24)
GFR, Estimated: >60 mL/min (ref >60)
Glucose, Bld: 127 mg/dL — ABNORMAL HIGH (ref 70–99)
Potassium: 3.5 mmol/L (ref 3.5–5.1)
Sodium: 139 mmol/L (ref 135–145)

## 2020-02-01 LAB — SARS CORONAVIRUS 2 (TAT 6-24 HRS): SARS Coronavirus 2: NEGATIVE

## 2020-02-01 NOTE — Telephone Encounter (Signed)
Spoke to patient's daughter, ok per DPR. They are agreeable to move heart cath to later this week. She will have the patient come get labs and COVID test this afternoon. She is aware to have the patient call the number on the pretesting door for someone to come out to swab the patient.   She verbalized understanding of following instructions:  You are scheduled for a Cardiac Catheterization on Thursday, December 16 with Dr. Cristal Deer End.  1. Please arrive at the [facility] (Main Entrance A) at [facility]: [address] [place], [place] [address] at 7:00 AM (This time is two hours before your procedure to ensure your preparation). Free valet parking service is available.   Special note: Every effort is made to have your procedure done on time. Please understand that emergencies sometimes delay scheduled procedures.  2. Diet: Do not eat solid foods after midnight.  The patient may have clear liquids until 5am upon the day of the procedure.  3. Labs: You will need to have blood drawn on Tuesday, December 14 at [facility], Go to 1st desk on your right to register.  Address: [address] [place], [place] [address]  Open: 8am - 5pm  Phone: [phone]. You do not need to be fasting.  COVID PRE- TEST: You will need a COVID TEST prior to the procedure:  LOCATION: [facility] Pre-Op Drive-Thru Testing site.  DATE/TIME:  02/01/20    4. Medication instructions in preparation for your procedure:   Contrast Allergy: No  Do not take Hydrochlorothiazide the morning of your procedure.  On the morning of your procedure, take your Aspirin and any morning medicines NOT listed above.  You may use sips of water.  5. Plan for one night stay--bring personal belongings. 6. Bring a current list of your medications and current insurance cards. 7. You MUST have a responsible person to drive you home. 8. Someone MUST be with you the first 24 hours after you arrive home  or your discharge will be delayed. 9. Please wear clothes that are easy to get on and off and wear slip-on shoes.  Thank you for allowing [place] to care for you!   -- [facility]

## 2020-02-02 ENCOUNTER — Telehealth: Payer: Self-pay | Admitting: *Deleted

## 2020-02-02 NOTE — Telephone Encounter (Signed)
Pt contacted pre-catheterization scheduled at [facility] for: Thursday February 03, 2020 9 AM Verified arrival time and place: [facility] Main Entrance A [facility]) at: 7 AM   No solid food after midnight prior to cath, clear liquids until 5 AM day of procedure.  Hold: HCTZ/KCl-AM of procedure  Except hold medications AM meds can be  taken pre-cath with sips of water including: ASA 81 mg   Confirmed patient has responsible adult to drive home post procedure and be with patient first 24 hours after arriving home: yes  You are allowed ONE visitor in the waiting room during the time you are at the hospital for your procedure. Both you and your visitor must wear a mask once you enter the hospital.       COVID-19 Pre-Screening Questions:  . In the past 14 days have you had any symptoms concerning for COVID-19 infection (fever, chills, cough, or new shortness of breath)? no . In the past 14 days have you been around anyone with known Covid 19? No   Reviewed procedure/mask/visitor instructions, COVID-19 questions with patient's daughter (DPR), [name].

## 2020-02-03 ENCOUNTER — Encounter (HOSPITAL_COMMUNITY)
Admission: RE | Disposition: A | Discharge status: Home / Self Care | Payer: Self-pay | Source: Home / Self Care | Attending: Internal Medicine

## 2020-02-03 ENCOUNTER — Ambulatory Visit (HOSPITAL_COMMUNITY)
Admission: RE | Admit: 2020-02-03 | Discharge: 2020-02-03 | Disposition: A | Discharge status: Home / Self Care | Payer: Medicare Other | Attending: Internal Medicine | Admitting: Internal Medicine

## 2020-02-03 DIAGNOSIS — R0602 Shortness of breath: Secondary | ICD-10-CM | POA: Diagnosis not present

## 2020-02-03 DIAGNOSIS — Z79899 Other long term (current) drug therapy: Secondary | ICD-10-CM | POA: Diagnosis not present

## 2020-02-03 DIAGNOSIS — I441 Atrioventricular block, second degree: Secondary | ICD-10-CM | POA: Diagnosis present

## 2020-02-03 DIAGNOSIS — I350 Nonrheumatic aortic (valve) stenosis: Secondary | ICD-10-CM

## 2020-02-03 HISTORY — PX: RIGHT/LEFT HEART CATH AND CORONARY ANGIOGRAPHY: CATH118266

## 2020-02-03 LAB — POCT I-STAT 7, (LYTES, BLD GAS, ICA,H+H)
Acid-Base Excess: 2 mmol/L (ref 0.0–2.0)
Bicarbonate: 25.2 mmol/L (ref 20.0–28.0)
Calcium, Ion: 1.18 mmol/L (ref 1.15–1.40)
HCT: 43 % (ref 39.0–52.0)
Hemoglobin: 14.6 g/dL (ref 13.0–17.0)
O2 Saturation: 100 %
Potassium: 3.4 mmol/L — ABNORMAL LOW (ref 3.5–5.1)
Sodium: 141 mmol/L (ref 135–145)
TCO2: 26 mmol/L (ref 22–32)
pCO2 arterial: 35.6 mmHg (ref 32.0–48.0)
pH, Arterial: 7.459 — ABNORMAL HIGH (ref 7.350–7.450)
pO2, Arterial: 167 mmHg — ABNORMAL HIGH (ref 83.0–108.0)

## 2020-02-03 LAB — POCT I-STAT EG7
Acid-Base Excess: 1 mmol/L (ref 0.0–2.0)
Bicarbonate: 24.9 mmol/L (ref 20.0–28.0)
Calcium, Ion: 1.17 mmol/L (ref 1.15–1.40)
HCT: 43 % (ref 39.0–52.0)
Hemoglobin: 14.6 g/dL (ref 13.0–17.0)
O2 Saturation: 79 %
Potassium: 3.3 mmol/L — ABNORMAL LOW (ref 3.5–5.1)
Sodium: 141 mmol/L (ref 135–145)
TCO2: 26 mmol/L (ref 22–32)
pCO2, Ven: 37.1 mmHg — ABNORMAL LOW (ref 44.0–60.0)
pH, Ven: 7.434 — ABNORMAL HIGH (ref 7.250–7.430)
pO2, Ven: 41 mmHg (ref 32.0–45.0)

## 2020-02-03 SURGERY — RIGHT/LEFT HEART CATH AND CORONARY ANGIOGRAPHY
Anesthesia: LOCAL

## 2020-02-03 MED ORDER — SODIUM CHLORIDE 0.9% FLUSH: 3.0000 mL | INTRAVENOUS | Status: DC | PRN

## 2020-02-03 MED ORDER — HEPARIN SODIUM (PORCINE) 1000 UNIT/ML IJ SOLN
INTRAMUSCULAR | Status: AC
  Filled 2020-02-03: qty 1

## 2020-02-03 MED ORDER — SODIUM CHLORIDE 0.9 % WEIGHT BASED INFUSION: 1.0000 mL/kg/h | INTRAVENOUS | Status: DC

## 2020-02-03 MED ORDER — SODIUM CHLORIDE 0.9% FLUSH: 3.0000 mL | Freq: Two times a day (BID) | INTRAVENOUS | Status: DC

## 2020-02-03 MED ORDER — HEPARIN SODIUM (PORCINE) 1000 UNIT/ML IJ SOLN
INTRAMUSCULAR | Status: DC | PRN
  Administered 2020-02-03: 4500 [IU] via INTRAVENOUS

## 2020-02-03 MED ORDER — FENTANYL CITRATE (PF) 100 MCG/2ML IJ SOLN
INTRAMUSCULAR | Status: AC
  Filled 2020-02-03: qty 2

## 2020-02-03 MED ORDER — FENTANYL CITRATE (PF) 100 MCG/2ML IJ SOLN
INTRAMUSCULAR | Status: DC | PRN
  Administered 2020-02-03: 12.5 ug via INTRAVENOUS

## 2020-02-03 MED ORDER — ONDANSETRON HCL 4 MG/2ML IJ SOLN: 4.0000 mg | Freq: Four times a day (QID) | INTRAMUSCULAR | Status: DC | PRN

## 2020-02-03 MED ORDER — HEPARIN (PORCINE) IN NACL 1000-0.9 UT/500ML-% IV SOLN
INTRAVENOUS | Status: AC
  Filled 2020-02-03: qty 1000

## 2020-02-03 MED ORDER — ACETAMINOPHEN 325 MG PO TABS: 650.0000 mg | ORAL_TABLET | ORAL | Status: DC | PRN

## 2020-02-03 MED ORDER — LIDOCAINE HCL (PF) 1 % IJ SOLN
INTRAMUSCULAR | Status: DC | PRN
  Administered 2020-02-03 (×2): 2 mL via INTRADERMAL

## 2020-02-03 MED ORDER — MIDAZOLAM HCL 2 MG/2ML IJ SOLN
INTRAMUSCULAR | Status: AC
  Filled 2020-02-03: qty 2

## 2020-02-03 MED ORDER — SODIUM CHLORIDE 0.9 % WEIGHT BASED INFUSION
3.0000 mL/kg/h | INTRAVENOUS | Status: AC
  Administered 2020-02-03: 08:00:00 3 mL/kg/h via INTRAVENOUS

## 2020-02-03 MED ORDER — VERAPAMIL HCL 2.5 MG/ML IV SOLN
INTRAVENOUS | Status: DC | PRN
  Administered 2020-02-03: 10:00:00 10 mL via INTRA_ARTERIAL
  Administered 2020-02-03: 10:00:00 5 mL via INTRA_ARTERIAL

## 2020-02-03 MED ORDER — LABETALOL HCL 5 MG/ML IV SOLN: 10.0000 mg | INTRAVENOUS | Status: DC | PRN

## 2020-02-03 MED ORDER — SODIUM CHLORIDE 0.9 % IV SOLN: INTRAVENOUS | Status: DC

## 2020-02-03 MED ORDER — LIDOCAINE HCL (PF) 1 % IJ SOLN
INTRAMUSCULAR | Status: AC
  Filled 2020-02-03: qty 30

## 2020-02-03 MED ORDER — SODIUM CHLORIDE 0.9% FLUSH
3.0000 mL | INTRAVENOUS | Status: DC | PRN
Start: 2020-02-03 — End: 2020-02-03

## 2020-02-03 MED ORDER — MIDAZOLAM HCL 2 MG/2ML IJ SOLN
INTRAMUSCULAR | Status: DC | PRN
  Administered 2020-02-03: 0.5 mg via INTRAVENOUS

## 2020-02-03 MED ORDER — ASPIRIN 81 MG PO CHEW: 81.0000 mg | CHEWABLE_TABLET | ORAL | Status: DC

## 2020-02-03 MED ORDER — HYDRALAZINE HCL 20 MG/ML IJ SOLN
10.0000 mg | INTRAMUSCULAR | Status: DC | PRN
Start: 2020-02-03 — End: 2020-02-03

## 2020-02-03 MED ORDER — SODIUM CHLORIDE 0.9 % IV SOLN: 250.0000 mL | INTRAVENOUS | Status: DC | PRN

## 2020-02-03 MED ORDER — VERAPAMIL HCL 2.5 MG/ML IV SOLN
INTRAVENOUS | Status: AC
  Filled 2020-02-03: qty 2

## 2020-02-03 MED ORDER — HEPARIN (PORCINE) IN NACL 1000-0.9 UT/500ML-% IV SOLN
INTRAVENOUS | Status: DC | PRN
  Administered 2020-02-03 (×2): 500 mL

## 2020-02-03 SURGICAL SUPPLY — 14 items
CATH 5FR JL3.5 JR4 ANG PIG MP (CATHETERS) ×2 IMPLANT
CATH BALLN WEDGE 5F 110CM (CATHETERS) ×2 IMPLANT
CATH INFINITI 4FR 145 PIGTAIL (CATHETERS) ×2 IMPLANT
DEVICE RAD COMP TR BAND LRG (VASCULAR PRODUCTS) ×2 IMPLANT
GLIDESHEATH SLEND SS 6F .021 (SHEATH) ×2 IMPLANT
GUIDEWIRE INQWIRE 1.5J.035X260 (WIRE) ×1 IMPLANT
INQWIRE 1.5J .035X260CM (WIRE) ×2
KIT HEART LEFT (KITS) ×2 IMPLANT
PACK CARDIAC CATHETERIZATION (CUSTOM PROCEDURE TRAY) ×2 IMPLANT
SHEATH 6FR 75 DEST SLENDER (SHEATH) ×2 IMPLANT
SHEATH GLIDE SLENDER 4/5FR (SHEATH) ×2 IMPLANT
TRANSDUCER W/STOPCOCK (MISCELLANEOUS) ×4 IMPLANT
TUBING CIL FLEX 10 FLL-RA (TUBING) ×2 IMPLANT
WIRE EMERALD ST .035X150CM (WIRE) ×2 IMPLANT

## 2020-02-03 NOTE — Discharge Instructions (Signed)
Radial Site Care  Elevate wrist for 24 hours You can drive on Sunday  Increase fluid intake to flush kidneys    This sheet gives you information about how to care for yourself after your procedure. Your health care provider may also give you more specific instructions. If you have problems or questions, contact your health care provider. What can I expect after the procedure? After the procedure, it is common to have:  Bruising and tenderness at the catheter insertion area. Follow these instructions at home: Medicines  Take over-the-counter and prescription medicines only as told by your health care provider. Insertion site care  Follow instructions from your health care provider about how to take care of your insertion site. Make sure you: ? Wash your hands with soap and water before you change your bandage (dressing). If soap and water are not available, use hand sanitizer. ? Change your dressing as told by your health care provider. ? Leave stitches (sutures), skin glue, or adhesive strips in place. These skin closures may need to stay in place for 2 weeks or longer. If adhesive strip edges start to loosen and curl up, you may trim the loose edges. Do not remove adhesive strips completely unless your health care provider tells you to do that.  Check your insertion site every day for signs of infection. Check for: ? Redness, swelling, or pain. ? Fluid or blood. ? Pus or a bad smell. ? Warmth.  Do not take baths, swim, or use a hot tub until your health care provider approves.  You may shower 24-48 hours after the procedure, or as directed by your health care provider. ? Remove the dressing and gently wash the site with plain soap and water. ? Pat the area dry with a clean towel. ? Do not rub the site. That could cause bleeding.  Do not apply powder or lotion to the site. Activity   For 24 hours after the procedure, or as directed by your health care provider: ? Do not flex  or bend the affected arm. ? Do not push or pull heavy objects with the affected arm. ? Do not drive yourself home from the hospital or clinic. You may drive 24 hours after the procedure unless your health care provider tells you not to. ? Do not operate machinery or power tools.  Do not lift anything that is heavier than 10 lb (4.5 kg), or the limit that you are told, until your health care provider says that it is safe.  Ask your health care provider when it is okay to: ? Return to work or school. ? Resume usual physical activities or sports. ? Resume sexual activity. General instructions  If the catheter site starts to bleed, raise your arm and put firm pressure on the site. If the bleeding does not stop, get help right away. This is a medical emergency.  If you went home on the same day as your procedure, a responsible adult should be with you for the first 24 hours after you arrive home.  Keep all follow-up visits as told by your health care provider. This is important. Contact a health care provider if:  You have a fever.  You have redness, swelling, or yellow drainage around your insertion site. Get help right away if:  You have unusual pain at the radial site.  The catheter insertion area swells very fast.  The insertion area is bleeding, and the bleeding does not stop when you hold steady pressure on  the area.  Your arm or hand becomes pale, cool, tingly, or numb. These symptoms may represent a serious problem that is an emergency. Do not wait to see if the symptoms will go away. Get medical help right away. Call your local emergency services (911 in the [place] Do not drive yourself to the hospital. Summary  After the procedure, it is common to have bruising and tenderness at the site.  Follow instructions from your health care provider about how to take care of your radial site wound. Check the wound every day for signs of infection.  Do not lift anything that is  heavier than 10 lb (4.5 kg), or the limit that you are told, until your health care provider says that it is safe. This information is not intended to replace advice given to you by your health care provider. Make sure you discuss any questions you have with your health care provider. Document Revised: 03/12/2017 Document Reviewed: 03/12/2017 Elsevier Patient Education  2020 [employer].

## 2020-02-03 NOTE — Brief Op Note (Signed)
BRIEF CARDIAC CATHETERIZATION NOTE  DATE: 02/03/2020  TIME: 10:50 AM  PATIENT:  Timothy Ramirez  86 y.o. male  PRE-OPERATIVE DIAGNOSIS:  Shortness of breath and aortic stenosis  POST-OPERATIVE DIAGNOSIS:  Same  PROCEDURE:  Procedure(s): RIGHT/LEFT HEART CATH AND CORONARY ANGIOGRAPHY (N/A)  SURGEON:  Surgeon(s) and Role:    * Kabella Cassidy, MD - Primary  FINDINGS: 1. Mild, non-obstructive CAD. 2. Normal left and right heart pressures. 3. Normal cardiac output. 4. Moderate aortic stenosis.  RECOMMENDATIONS: 1. Continue medical therapy. 2. Readdress PPM with Dr. Graciela Husbands, as episodic non-exertional shortness of breath and malaise may be manifestation of known intermittent 2nd degree AV block.  Yvonne Kendall, MD [facility]

## 2020-02-03 NOTE — Interval H&P Note (Signed)
History and Physical Interval Note:  02/03/2020 9:15 AM  Timothy Ramirez  has presented today for surgery, with the diagnosis of shortness of breath and aortic stenosis.  The various methods of treatment have been discussed with the patient and family. After consideration of risks, benefits and other options for treatment, the patient has consented to  Procedure(s): RIGHT/LEFT HEART CATH AND CORONARY ANGIOGRAPHY (N/A) as a surgical intervention.  The patient's history has been reviewed, patient examined, no change in status, stable for surgery.  I have reviewed the patient's chart and labs.  Questions were answered to the patient's satisfaction.    Cath Lab Visit (complete for each Cath Lab visit)  Clinical Evaluation Leading to the Procedure:   ACS: No.  Non-ACS:    Anginal/Heart Failure Classification: NYHA IV  Anti-ischemic medical therapy: No Therapy  Non-Invasive Test Results: Low-risk stress test findings: cardiac mortality <1%/year  Prior CABG: No previous CABG  [name]

## 2020-02-04 ENCOUNTER — Encounter (HOSPITAL_COMMUNITY): Payer: Self-pay | Admitting: Internal Medicine

## 2020-02-08 ENCOUNTER — Telehealth: Payer: Self-pay | Admitting: *Deleted

## 2020-02-08 NOTE — Telephone Encounter (Signed)
I called and spoke with the patient's daughter, Lynnell Dike (ok per [facility]) regarding an appointment with Dr. Graciela Husbands to discuss a PPM Implant.   Per Lynnell Dike, they are agreeable with this.   I have scheduled the patient for an appointment on 02/10/20 at 9:40 am with Dr. Graciela Husbands.  [name] voices understanding.

## 2020-02-08 NOTE — Telephone Encounter (Signed)
-----   Message from Yvonne Kendall, MD sent at 02/03/2020  3:51 PM EST ----- Cleone Slim Lennart Pall saw [name] last month for evaluation of 2:1 AV block and episodes of transient dyspnea and malaise.  You offered PPM but he and his daughter wished to defer.  It seems like his episodes have become more frequent.  We did a R/LHC today, which showed minimal CAD and moderate AS.  Given the paroxysmal and non-exertional nature of of dyspnea/malaise, I am less inclined to think that it is being driving by AS.  I think he would benefit from PPM placement, and he and his daughter now seem on board with this.  Are you able to reach out to them to discuss further/facilitate PPM placement?  Thanks.  Thayer Ohm

## 2020-02-10 ENCOUNTER — Ambulatory Visit (INDEPENDENT_AMBULATORY_CARE_PROVIDER_SITE_OTHER): Payer: Medicare Other | Admitting: Internal Medicine

## 2020-02-10 ENCOUNTER — Encounter: Payer: Self-pay | Admitting: Internal Medicine

## 2020-02-10 ENCOUNTER — Other Ambulatory Visit: Payer: Self-pay

## 2020-02-10 VITALS — BP 130/60 | HR 67 | Ht 70.0 in | Wt 191.5 lb

## 2020-02-10 DIAGNOSIS — I350 Nonrheumatic aortic (valve) stenosis: Secondary | ICD-10-CM | POA: Diagnosis not present

## 2020-02-10 DIAGNOSIS — E876 Hypokalemia: Secondary | ICD-10-CM | POA: Diagnosis not present

## 2020-02-10 DIAGNOSIS — I441 Atrioventricular block, second degree: Secondary | ICD-10-CM | POA: Diagnosis not present

## 2020-02-10 DIAGNOSIS — R001 Bradycardia, unspecified: Secondary | ICD-10-CM

## 2020-02-10 NOTE — Progress Notes (Signed)
Patient Care Team: Jerl Mina, MD as PCP - General (Family Medicine) End, Cristal Deer, MD as PCP - Cardiology (Cardiology)   HPI  Timothy Ramirez is a 86 y.o. male seen in follow-up for bradycardia associated with 2: 1 heart block.  History of episodic and unpredictable dyspnea.  History of aortic stenosis.  At the initial visit, 11/21, it was elected to postpone a decision as he was not very ambulatory and he was going to discuss it further with his daughter.  The episodic nature suggested that aortic stenosis was not likely the issue.  The patient saw Dr. Bernita Buffy 01/26/2020 and the plan was to pursue catheterization  This was unrevealing for CAD as noted  His daughter wonders whether it could be related to a pulmonary process.  Only chest imaging I can find is 2/21 view chest x-ray unrevealing.  No history of asthma    DATE TEST EF   9/21 Echo   60-65 % AoStenosis Mod MeanGrad 28  12/21 LHC   AoStenosis mod/sev meanGrad 37  Non obstructive CAD         Date Cr K Hgb  9/21 1.10 3.2>4.2 16.8  12/21 1.16 3.4<<3.5       Records and Results Reviewed   Past Medical History:  Diagnosis Date  . Aortic stenosis   . Borderline diabetes   . Hyperlipidemia   . Hypertension   . Second degree AV block     Past Surgical History:  Procedure Laterality Date  . APPENDECTOMY    . HERNIA REPAIR    . RIGHT/LEFT HEART CATH AND CORONARY ANGIOGRAPHY N/A 02/03/2020   Procedure: RIGHT/LEFT HEART CATH AND CORONARY ANGIOGRAPHY;  Surgeon: Yvonne Kendall, MD;  Location: [facility] INVASIVE [facility];  Service: Cardiovascular;  Laterality: N/A;  . TONSILLECTOMY      Current Meds  Medication Sig  . aspirin EC 81 MG tablet Take 81 mg by mouth daily. Swallow whole.  Marland Kitchen atorvastatin (LIPITOR) 20 MG tablet Take 20 mg by mouth daily.  . citalopram (CELEXA) 20 MG tablet Take 20 mg by mouth daily.  . hydrochlorothiazide (HYDRODIURIL) 12.5 MG tablet Take 1 tablet (12.5 mg total) by mouth daily.  .  potassium chloride SA (KLOR-CON) 20 MEQ tablet Take 20 mEq by mouth daily.  . rivastigmine (EXELON) 1.5 MG capsule Take 1.5 mg by mouth 2 (two) times daily.  . temazepam (RESTORIL) 30 MG capsule Take 30 mg by mouth at bedtime.    No Known Allergies    Review of Systems negative except from HPI and PMH  Physical Exam BP 130/60 (BP Location: Left Arm, Patient Position: Sitting, Cuff Size: Normal)   Pulse 67   Ht 5\' 10"  (1.778 m)   Wt 191 lb 8 oz (86.9 kg)   SpO2 97%   BMI 27.48 kg/m  Well developed and well nourished in no acute distress HENT normal E scleral and icterus clear Neck Supple JVP flat; carotids brisk and full Clear to ausculation Regular rate and rhythm, no murmurs gallops or rub Soft with active bowel sounds No clubbing cyanosis  Edema Alert and oriented, grossly normal motor and sensory function Skin Warm and Dry  ECG sinus at 59 Intervals 13/08/43  Estimated Creatinine Clearance: 48.9 mL/min (by C-G formula based on SCr of 1.16 mg/dL).   Assessment and  Plan Dyspnea on exertion-intermittent  Mobitz 2 first-degree heart block with daytime rates in the high 30s low 40s  Aortic stenosis-moderate   The issue of intermittent  dyspnea remains unexplained.  The concern as to whether his arrhythmia is certainly more likely than it being secondary to aortic stenosis as outlined by Dr. Okey Dupre.  The family is concerned as to whether could be a pulmonary process.  Unfortunately nobody was in the pulmonary section today I was going to ask the question as to whether pulmonary function testing would be sufficient as a diagnostic tool to exclude what I would presume would have to be reactive airways disease to explain intermittent dyspnea.  I will reach out to them to ask.  The family would be open to pursuing pacing following the exclusion of that possibility  In this regard the patient's daughter says he lives alone largely with friends coming by putting his memory  apparently is quite limited and so using a pulse oximeter to clarify his pulse during episodes of dyspnea is not likely tenable   Current medicines are reviewed at length with the patient today .  The patient does not  have concerns regarding medicines.

## 2020-02-10 NOTE — Patient Instructions (Addendum)
Medication Instructions:  - Your physician recommends that you continue on your current medications as directed. Please refer to the Current Medication list given to you today.  *If you need a refill on your cardiac medications before your next appointment, please call your pharmacy*   Lab Work: - Your physician recommends that you have lab work today: BMP   If you have labs (blood work) drawn today and your tests are completely normal, you will receive your results only by: Marland Kitchen MyChart Message (if you have MyChart) OR . A paper copy in the mail If you have any lab test that is abnormal or we need to change your treatment, we will call you to review the results.   Testing/Procedures: - none ordered   Follow-Up: At [facility], you and your health needs are our priority.  As part of our continuing mission to provide you with exceptional heart care, we have created designated Provider Care Teams.  These Care Teams include your primary Cardiologist (physician) and Advanced Practice Providers (APPs -  Physician Assistants and Nurse Practitioners) who all work together to provide you with the care you need, when you need it.  We recommend signing up for the patient portal called "MyChart".  Sign up information is provided on this After Visit Summary.  MyChart is used to connect with patients for Virtual Visits (Telemedicine).  Patients are able to view lab/test results, encounter notes, upcoming appointments, etc.  Non-urgent messages can be sent to your provider as well.   To learn more about what you can do with MyChart, go to [email or web address].    Your next appointment:   Pending Dr. Odessa Fleming discussion with the Pulmonary Team   The format for your next appointment:   In Person  Provider:   Sherryl Manges, MD   Other Instructions n/

## 2020-02-11 LAB — BASIC METABOLIC PANEL
BUN/Creatinine Ratio: 17 (ref 10–24)
BUN: 20 mg/dL (ref 8–27)
CO2: 24 mmol/L (ref 20–29)
Calcium: 9.1 mg/dL (ref 8.6–10.2)
Chloride: 102 mmol/L (ref 96–106)
Creatinine, Ser: 1.2 mg/dL (ref 0.76–1.27)
GFR calc Af Amer: 64 mL/min/{1.73_m2} (ref >59)
GFR calc non Af Amer: 55 mL/min/{1.73_m2} — ABNORMAL LOW (ref >59)
Glucose: 154 mg/dL — ABNORMAL HIGH (ref 65–99)
Potassium: 4.3 mmol/L (ref 3.5–5.2)
Sodium: 139 mmol/L (ref 134–144)

## 2020-02-21 ENCOUNTER — Other Ambulatory Visit: Payer: Medicare Other

## 2020-02-23 ENCOUNTER — Ambulatory Visit: Admit: 2020-02-23 | Payer: Medicare Other | Admitting: Internal Medicine

## 2020-02-23 DIAGNOSIS — R0602 Shortness of breath: Secondary | ICD-10-CM

## 2020-02-23 DIAGNOSIS — I350 Nonrheumatic aortic (valve) stenosis: Secondary | ICD-10-CM

## 2020-02-23 SURGERY — RIGHT/LEFT HEART CATH AND CORONARY ANGIOGRAPHY
Anesthesia: Moderate Sedation

## 2020-03-03 ENCOUNTER — Other Ambulatory Visit: Payer: Self-pay | Admitting: *Deleted

## 2020-03-03 DIAGNOSIS — R0602 Shortness of breath: Secondary | ICD-10-CM

## 2020-03-03 NOTE — Progress Notes (Signed)
pft order

## 2020-03-08 ENCOUNTER — Ambulatory Visit: Payer: Medicare Other | Admitting: Internal Medicine

## 2020-03-13 ENCOUNTER — Ambulatory Visit: Payer: Self-pay | Admitting: Urology

## 2020-03-16 ENCOUNTER — Other Ambulatory Visit: Payer: Self-pay

## 2020-03-16 ENCOUNTER — Other Ambulatory Visit
Admission: RE | Admit: 2020-03-16 | Discharge: 2020-03-16 | Disposition: A | Discharge status: Home / Self Care | Payer: Medicare Other | Source: Ambulatory Visit | Attending: Internal Medicine | Admitting: Internal Medicine

## 2020-03-16 DIAGNOSIS — Z20822 Contact with and (suspected) exposure to covid-19: Secondary | ICD-10-CM | POA: Insufficient documentation

## 2020-03-16 DIAGNOSIS — Z01812 Encounter for preprocedural laboratory examination: Secondary | ICD-10-CM | POA: Diagnosis present

## 2020-03-17 ENCOUNTER — Ambulatory Visit: Payer: Medicare Other

## 2020-03-17 LAB — SARS CORONAVIRUS 2 (TAT 6-24 HRS): SARS Coronavirus 2: NEGATIVE

## 2020-03-20 ENCOUNTER — Ambulatory Visit: Payer: Medicare Other | Attending: Internal Medicine

## 2020-03-20 ENCOUNTER — Other Ambulatory Visit: Payer: Self-pay

## 2020-03-20 DIAGNOSIS — R0602 Shortness of breath: Secondary | ICD-10-CM | POA: Diagnosis not present

## 2020-03-20 MED ORDER — ALBUTEROL SULFATE (2.5 MG/3ML) 0.083% IN NEBU
2.5000 mg | INHALATION_SOLUTION | Freq: Once | RESPIRATORY_TRACT | Status: AC
  Administered 2020-03-20: 2.5 mg via RESPIRATORY_TRACT
  Filled 2020-03-20: qty 3

## 2020-03-21 LAB — PULMONARY FUNCTION TEST ARMC ONLY
DL/VA % pred: 91 %
DL/VA: 3.49 ml/min/mmHg/L
DLCO unc % pred: 63 %
DLCO unc: 15.08 ml/min/mmHg
FEF 25-75 Post: 1.81 L/sec
FEF 25-75 Pre: 1.53 L/sec
FEF2575-%Change-Post: 17 %
FEF2575-%Pred-Post: 101 %
FEF2575-%Pred-Pre: 86 %
FEV1-%Change-Post: 12 %
FEV1-%Pred-Post: 81 %
FEV1-%Pred-Pre: 72 %
FEV1-Post: 2.21 L
FEV1-Pre: 1.96 L
FEV1FVC-%Change-Post: 18 %
FEV1FVC-%Pred-Pre: 94 %
FEV6-%Change-Post: -3 %
FEV6-%Pred-Post: 78 %
FEV6-%Pred-Pre: 81 %
FEV6-Post: 2.8 L
FEV6-Pre: 2.9 L
FEV6FVC-%Change-Post: 1 %
FEV6FVC-%Pred-Post: 107 %
FEV6FVC-%Pred-Pre: 106 %
FVC-%Change-Post: -4 %
FVC-%Pred-Post: 72 %
FVC-%Pred-Pre: 76 %
FVC-Post: 2.8 L
Post FEV1/FVC ratio: 79 %
Post FEV6/FVC ratio: 100 %
Pre FEV1/FVC ratio: 67 %
Pre FEV6/FVC Ratio: 99 %
RV % pred: 123 %
RV: 3.38 L
TLC % pred: 86 %
TLC: 6.09 L

## 2020-03-24 ENCOUNTER — Ambulatory Visit: Payer: Medicare Other | Admitting: Urology

## 2020-04-27 ENCOUNTER — Emergency Department: Payer: Medicare Other

## 2020-04-27 ENCOUNTER — Other Ambulatory Visit: Payer: Self-pay

## 2020-04-27 ENCOUNTER — Telehealth: Payer: Self-pay | Admitting: Internal Medicine

## 2020-04-27 ENCOUNTER — Emergency Department
Admission: EM | Admit: 2020-04-27 | Discharge: 2020-04-27 | Disposition: A | Discharge status: Home / Self Care | Payer: Medicare Other | Attending: Emergency Medicine | Admitting: Emergency Medicine

## 2020-04-27 DIAGNOSIS — Z79899 Other long term (current) drug therapy: Secondary | ICD-10-CM | POA: Insufficient documentation

## 2020-04-27 DIAGNOSIS — F10230 Alcohol dependence with withdrawal, uncomplicated: Secondary | ICD-10-CM

## 2020-04-27 DIAGNOSIS — F1010 Alcohol abuse, uncomplicated: Secondary | ICD-10-CM | POA: Diagnosis not present

## 2020-04-27 DIAGNOSIS — R079 Chest pain, unspecified: Secondary | ICD-10-CM

## 2020-04-27 DIAGNOSIS — Z7982 Long term (current) use of aspirin: Secondary | ICD-10-CM | POA: Diagnosis not present

## 2020-04-27 DIAGNOSIS — R072 Precordial pain: Secondary | ICD-10-CM | POA: Diagnosis not present

## 2020-04-27 DIAGNOSIS — R001 Bradycardia, unspecified: Secondary | ICD-10-CM | POA: Diagnosis not present

## 2020-04-27 DIAGNOSIS — M791 Myalgia, unspecified site: Secondary | ICD-10-CM | POA: Diagnosis not present

## 2020-04-27 DIAGNOSIS — I10 Essential (primary) hypertension: Secondary | ICD-10-CM | POA: Insufficient documentation

## 2020-04-27 DIAGNOSIS — F10930 Alcohol use, unspecified with withdrawal, uncomplicated: Secondary | ICD-10-CM

## 2020-04-27 DIAGNOSIS — E119 Type 2 diabetes mellitus without complications: Secondary | ICD-10-CM | POA: Diagnosis not present

## 2020-04-27 LAB — BASIC METABOLIC PANEL
Anion gap: 8 (ref 5–15)
BUN: 23 mg/dL (ref 8–23)
CO2: 23 mmol/L (ref 22–32)
Calcium: 9.2 mg/dL (ref 8.9–10.3)
Chloride: 106 mmol/L (ref 98–111)
Creatinine, Ser: 1.16 mg/dL (ref 0.61–1.24)
GFR, Estimated: >60 mL/min (ref >60)
Glucose, Bld: 119 mg/dL — ABNORMAL HIGH (ref 70–99)
Potassium: 4.4 mmol/L (ref 3.5–5.1)
Sodium: 137 mmol/L (ref 135–145)

## 2020-04-27 LAB — CBC
HCT: 45 % (ref 39.0–52.0)
Hemoglobin: 15.8 g/dL (ref 13.0–17.0)
MCH: 32.1 pg (ref 26.0–34.0)
MCHC: 35.1 g/dL (ref 30.0–36.0)
MCV: 91.5 fL (ref 80.0–100.0)
Platelets: 142 10*3/uL — ABNORMAL LOW (ref 150–400)
RBC: 4.92 MIL/uL (ref 4.22–5.81)
RDW: 13.2 % (ref 11.5–15.5)
WBC: 7 10*3/uL (ref 4.0–10.5)
nRBC: 0 % (ref 0.0–0.2)

## 2020-04-27 LAB — ETHANOL: Alcohol, Ethyl (B): <10 mg/dL (ref <10)

## 2020-04-27 LAB — TROPONIN I (HIGH SENSITIVITY)
Troponin I (High Sensitivity): 45 ng/L — ABNORMAL HIGH (ref <18)
Troponin I (High Sensitivity): 47 ng/L — ABNORMAL HIGH (ref <18)

## 2020-04-27 NOTE — Telephone Encounter (Signed)
Discussed with Dr End. He advises patient to call PCP or pursue Urgent or Emergency care as patient has had history of the heart block and now the alcohol withdrawal may require emergency evaluation.  Called daughter back and she verbalized understanding of the recommendations.  She is going to call [facility] and then decide where to take the patient for further evaluation.  She was very Adult nurse.

## 2020-04-27 NOTE — BH Assessment (Signed)
Referral information for Psychiatric Hospitalization faxed to;   . [facility] [phone])  . [facility] [phone] or [phone])  . [facility] [phone]),   . [facility] [phone] or [phone])  . [facility] [phone])  . [facility] [phone])

## 2020-04-27 NOTE — ED Notes (Signed)
Pt stated that he thought cath was leaking under him.  No wetness found on sheets.  Pt verbalized understanding that he was not wet. Left pt with visitor in room.

## 2020-04-27 NOTE — BH Assessment (Signed)
Patient completed phone call interview.  Writer received phone call ([phone]) stating the patient is accepted to [facility]. Writer updated ER MD (Dr. Fanny Bien).    _______________________ [facility] [address]  [place], [place] [address] [phone]

## 2020-04-27 NOTE — Telephone Encounter (Signed)
STAT if HR is under 50 or over 120 (normal HR is 60-100 beats per minute)  1) What is your heart rate? 38-40   2) Do you have a log of your heart rate readings (document readings)? Trending low since last night   3) Do you have any other symptoms? Per daughter patient probably detox ing.  They have removed alcohol from patient home.  Patient last drink Saturday morning .  Daughter states she called EMS yesterday for Huston Foley and confusion.  Patient unable to recall year or the president but knew his name.  Daughter says patient is not stumbling around this morning but doesn't feel right and is breathing heavy.

## 2020-04-27 NOTE — ED Triage Notes (Signed)
Pt comes with c/o CP that started earlier this am.  Pt states mid sternal CP.  Family with pt and states they are here for detox for pt. Pt has been drinking for awhile but more heavy for last year.  Pt states he drinks [employer] drink. Pt states 2-3 drinks a day. Family states large cups at night.  Pt does have some memory loss that is being evaluated.  Pt states last drink was last Friday night into Saturday morning. Pt denies any fever, chills N/V. Pt does have noticeable shake.

## 2020-04-27 NOTE — ED Provider Notes (Signed)
[facility] Emergency Department Provider Note  Time seen: 2:18 PM  I have reviewed the triage vital signs and the nursing notes.   HISTORY  Chief Complaint Chest Pain and Detox   HPI Timothy Ramirez is a 87 y.o. male with a past medical history of aortic stenosis, hypertension, hyperlipidemia second-degree AV block, intermittent bradycardia, presents to the emergency department for alcohol detox and chest pain.  According to the patient and family patient has been drinking liquor on a nightly basis for quite some time.  Approximately 2 to 3 weeks ago they took the patient's alcohol away from him, he did get shaky per daughter.  States last week the patient somehow obtained alcohol once again but his last drink they believe was Saturday.  Patient does have short-term memory deficits and believes he is not drinking since Saturday as well.  States he has been feeling little shaky and experiencing fairly diffuse body aches mother states this morning they were more so in the left chest.  Patient denies any chest pain currently.  No shortness of breath.   Past Medical History:  Diagnosis Date   Aortic stenosis    Borderline diabetes    Hyperlipidemia    Hypertension    Second degree AV block     Patient Active Problem List   Diagnosis Date Noted   AV block, 2nd degree 12/24/2019   Bradycardia 11/08/2019   Constipation 11/08/2019   Elevated troponin 11/08/2019   Diabetes mellitus type 2, uncomplicated (HCC) 11/08/2019   Hypertension 11/08/2019   Microhematuria 08/04/2019   BPH without obstruction/lower urinary tract symptoms 08/04/2019   Atypical chest pain 05/20/2018   Shortness of breath 05/20/2018   Aortic valve stenosis 05/20/2018   Nonrheumatic mitral valve regurgitation 05/20/2018   Essential hypertension 05/20/2018   Hyperlipidemia 05/20/2018    Past Surgical History:  Procedure Laterality Date   APPENDECTOMY     HERNIA REPAIR      RIGHT/LEFT HEART CATH AND CORONARY ANGIOGRAPHY N/A 02/03/2020   Procedure: RIGHT/LEFT HEART CATH AND CORONARY ANGIOGRAPHY;  Surgeon: Yvonne Kendall, MD;  Location: [facility] INV[facility] LAB;  Service: Cardiovascular;  Laterality: N/A;   TONSILLECTOMY      Prior to Admission medications   Medication Sig Start Date End Date Taking? Authorizing Provider  aspirin EC 81 MG tablet Take 81 mg by mouth daily. Swallow whole.    [provider]  atorvastatin (LIPITOR) 20 MG tablet Take 20 mg by mouth daily.    [provider]  citalopram (CELEXA) 20 MG tablet Take 20 mg by mouth daily. 08/18/19   [provider]  hydrochlorothiazide (HYDRODIURIL) 12.5 MG tablet Take 1 tablet (12.5 mg total) by mouth daily. 12/23/19   End, Cristal Deer, MD  potassium chloride SA (KLOR-CON) 20 MEQ tablet Take 20 mEq by mouth daily. 10/17/19   [provider]  rivastigmine (EXELON) 1.5 MG capsule Take 1.5 mg by mouth 2 (two) times daily. 08/18/19   [provider]  temazepam (RESTORIL) 30 MG capsule Take 30 mg by mouth at bedtime.    [provider]    No Known Allergies  Family History  Problem Relation Age of Onset   Congestive Heart Failure Mother    Heart attack Mother 27   Cirrhosis Father    COPD Sister     Social History Social History   Tobacco Use   Smoking status: Never Smoker   Smokeless tobacco: Never Used  Building services engineer Use: Never used  Substance Use Topics   Alcohol use: Yes    Alcohol/week: 2.0 standard drinks    Types: 1 Cans of beer, 1 Shots of liquor per week    Comment: last drink friday into saturday   Drug use: No    Review of Systems Constitutional: Negative for fever. Cardiovascular: Was complaining of chest pain, none currently Respiratory: Negative for shortness of breath. Gastrointestinal: Negative for abdominal pain.  Negative for vomiting. Genitourinary: Negative for urinary compaints Musculoskeletal: Body  aches. Neurological: Negative for headache All other ROS negative ____________________________________________   PHYSICAL EXAM:  VITAL SIGNS: ED Triage Vitals [04/27/20 1215]  Enc Vitals Group     BP 134/63     Pulse Rate 65     Resp 18     Temp 98 F (36.7 C)     Temp src      SpO2 95 %     Weight      Height      Head Circumference      Peak Flow      Pain Score 3     Pain Loc      Pain Edu?      Excl. in GC?    Constitutional: Alert and oriented. Well appearing and in no distress. Eyes: Normal exam ENT      Head: Normocephalic and atraumatic.      Nose: No congestion/rhinnorhea.      Mouth/Throat: Mucous membranes are moist. Cardiovascular: Normal rate, regular rhythm.  3/6 murmur. Respiratory: Normal respiratory effort without tachypnea nor retractions. Breath sounds are clear Gastrointestinal: Soft and nontender. No distention.  Musculoskeletal: Nontender with normal range of motion in all extremities. No lower extremity tenderness  Neurologic:  Normal speech and language. No gross focal neurologic deficits Skin:  Skin is warm, dry and intact.  Psychiatric: Mood and affect are normal.  ____________________________________________    EKG  EKG viewed and interpreted by myself shows a normal sinus rhythm at 67 bpm with a narrow QRS, normal axis, normal intervals, nonspecific ST changes.  ____________________________________________    RADIOLOGY  Chest x-ray is negative  ____________________________________________   INITIAL IMPRESSION / ASSESSMENT AND PLAN / ED COURSE  Pertinent labs & imaging results that were available during my care of the patient were reviewed by me and considered in my medical decision making (see chart for details).   Patient presents to the emergency department for alcohol detox.  Was experiencing some chest discomfort and body aches earlier today.  Daughter states he has been intermittently shaky since stopped drinking on  Saturday.  No vomiting.  No seizures.  No syncopal episodes.  During my evaluation patient noted to have a sustained heart rate around 40 bpm with an EKG monitor read consistent with second-degree AV block.  I reviewed the patient's chart it appears to have a history of second-degree AV block.  No shortness of breath, no lightheadedness or dizziness.  Suspect this is chronic for the patient.  Bradycardia only lasted several minutes and is now back to a rate around 60 to 70 bpm.  Patient's troponin is mildly elevated however large unchanged from historical values.  We will repeat a troponin as a precaution.  If the patient's repeat troponin is unchanged we will medically clear the patient for TTS to evaluate for possible detox placement.  Patient and daughter agreeable to plan of care.  Patient's repeat troponin is unchanged.  Patient medically cleared for detox.  I spoke to TTS provider who will be over  to speak to the patient regarding detox options.  Patient care signed out to oncoming provider.  DAREAN ROTE was evaluated in Emergency Department on 04/27/2020 for the symptoms described in the history of present illness. He was evaluated in the context of the global COVID-19 pandemic, which necessitated consideration that the patient might be at risk for infection with the SARS-CoV-2 virus that causes COVID-19. Institutional protocols and algorithms that pertain to the evaluation of patients at risk for COVID-19 are in a state of rapid change based on information released by regulatory bodies including the CDC and federal and state organizations. These policies and algorithms were followed during the patient's care in the ED.  ____________________________________________   FINAL CLINICAL IMPRESSION(S) / ED DIAGNOSES  Chest pain Bradycardia Alcohol abuse   Minna Antis, MD 04/27/20 1521

## 2020-04-27 NOTE — BH Assessment (Addendum)
Comprehensive Clinical Assessment (CCA) Screening, Triage and Referral Note  04/27/2020 Timothy Ramirez [id number]  Timothy Ramirez is a 87 year old, Caucasian male who presents to the ER due to chest pain. It was discovered while in ER the patient as the history of alcohol abuse. Per the report of the patient, he does not have a problem with alcohol. He admits to drinking three to four times a week of unknown amount of alcohol. He initially states it is less than a pint of liquor a day but during the course of the interview he changed the amount to 1/4 of alcohol my day. He states he had drinks this amount for approximately a year. He started drinking alcohol approximately three years ago. He states he doesn't know why he started it doesn't believe he was triggered by something.  Per the report other patient's daughter [name]), he drinks more then what he is reporting. They state they have found several empty liquor bottles throughout the home and the ones that have liquor in it they have discarded them. however, the patient continues to get the alcohol by an unknown means. patient lives independently but during the day someone comes in the home to assist him look daily tasks well overall he does things on his own. In the evening and nighttime, the patient is home by himself and that is when he starts drinking. The daughter further shares, within the last three months he has failed two times because he was intoxicated, which resulted in him having a black eye each time. His last drink was last this past Sunday (04/23/2020).  During the interview, the patient was calm, cooperative, and pleasant. He was able to provide appropriate answers to the questions. He denies suicidal and homicidal ideations, as well as visual and auditory hallucinations. He denies the use of any other mind-altering substances and no involvement with the legal system.  Chief Complaint:  Chief Complaint  Patient presents with  .  Chest Pain  . Detox   Visit Diagnosis: Alcohol Use Disorder  Patient Reported Information How did you hear about [place]? Family/Friend   Referral name: [name]   Referral phone number: [phone]  Whom do you see for routine medical problems? No data recorded  Practice/Facility Name: No data recorded  Practice/Facility Phone Number: No data recorded  Name of Contact: No data recorded  Contact Number: No data recorded  Contact Fax Number: No data recorded  Prescriber Name: No data recorded  Prescriber Address (if known): No data recorded What Is the Reason for Your Visit/Call Today? Cheat Pain  How Long Has This Been Causing You Problems? <Week  Have You Recently Been in Any Inpatient Treatment (Hospital/Detox/Crisis Center/28-Day Program)? No   Name/Location of Program/Hospital:No data recorded  How Long Were You There? No data recorded  When Were You Discharged? No data recorded Have You Ever Received Services From [facility] Before? Yes   Who Do You See at [facility]? Medical Treatment  Have You Recently Had Any Thoughts About Hurting Yourself? No   Are You Planning to Commit Suicide/Harm Yourself At This time?  No  Have you Recently Had Thoughts About Hurting Someone Karolee Ohs? No   Explanation: No data recorded Have You Used Any Alcohol or Drugs in the Past 24 Hours? No   How Long Ago Did You Use Drugs or Alcohol?  No data recorded  What Did You Use and How Much? No data recorded What Do You Feel Would Help You the  Most Today? Other (Comment)  Do You Currently Have a Therapist/Psychiatrist? No   Name of Therapist/Psychiatrist: No data recorded  Have You Been Recently Discharged From Any Office Practice or Programs? No   Explanation of Discharge From Practice/Program:  No data recorded    CCA Screening Triage Referral Assessment Type of Contact: Face-to-Face   Is this Initial or Reassessment? No data recorded  Date Telepsych consult ordered in [facility]:  No data  recorded  Time Telepsych consult ordered in [facility]:  No data recorded Patient Reported Information Reviewed? Yes   Patient Left Without Being Seen? No data recorded  Reason for Not Completing Assessment: No data recorded Collateral Involvement: Spoke with daughter  Does Patient Have a Automotive engineer Guardian? No data recorded  Name and Contact of Legal Guardian:  No data recorded If Minor and Not Living with Parent(s), Who has Custody? n/a  Is CPS involved or ever been involved? Never  Is APS involved or ever been involved? Never  Patient Determined To Be At Risk for Harm To Self or Others Based on Review of Patient Reported Information or Presenting Complaint? No   Method: No data recorded  Availability of Means: No data recorded  Intent: No data recorded  Notification Required: No data recorded  Additional Information for Danger to Others Potential:  No data recorded  Additional Comments for Danger to Others Potential:  No data recorded  Are There Guns or Other Weapons in Your Home?  No data recorded   Types of Guns/Weapons: No data recorded   Are These Weapons Safely Secured?                              No data recorded   Who Could Verify You Are Able To Have These Secured:    No data recorded Do You Have any Outstanding Charges, Pending Court Dates, Parole/Probation? No data recorded Contacted To Inform of Risk of Harm To Self or Others: No data recorded Location of Assessment: [facility] ED  Does Patient Present under Involuntary Commitment? No   IVC Papers Initial File Date: No data recorded  [place] of Residence: [place]  Patient Currently Receiving the Following Services: Not Receiving Services   Determination of Need: No data recorded  Options For Referral: No data recorded  Lilyan Gilford MS, LCAS, [facility], N[facility] Therapeutic Triage Specialist 04/27/2020 4:35 PM

## 2020-04-27 NOTE — ED Provider Notes (Signed)
Patient not having no active chest pain or discomfort.  He is noted to have some bradycardia with occasional 2-1 AV block, but this is on review of notes well known and has previously been evaluated by electrophysiology.  Both patient and his daughter her aware of this.  His troponin remains just mildly elevated at 47 but appears to have some very mild elevation of troponins in the past as well.  Suspect chronic.  Reassuring evaluation.  He has no evidence of withdrawal such as shakes, severe tachycardia, hypertension sweating etc.  He is also at least 4 to 5 days out from his last drink which was on Saturday.  TTS evaluated and has provided an option for him to go to [place] but he and family not interested after reading reviews.  They are provided additional centers which they may contact.  At this point having discussed with both the patient and his daughter, I do not know that further ER work-up will be of assistance in placing him to detox.  He is stable appropriate for discharge and he and his daughter will continue to work with primary care and local resources to see if he may be able to enter a detox program.  My overall impression knows that he is likely through the majority of detox withdrawal symptoms at this point.  He is awake alert well oriented no distress.  Some mild bradycardia, but this is known and well-documented for him.  Return precautions and treatment recommendations and follow-up discussed with the patient who is agreeable with the plan.    Sharyn Creamer, MD 04/27/20 [phone]

## 2020-04-27 NOTE — Telephone Encounter (Signed)
Spoke to patient's daughter. They found out that patient has been drinking alcohol in the evennings for an unknown amount of time. Some friends who eat breakfast with the patient, realized he was hungover some mornings. Patient's family decided to take the alcohol away from the patient over the weekend. Saturday was patient's last drink. He's been going though withdrawal with symptoms of being grumpy, depressed and sleeping a lot. Patient also is confused though has a history of memory loss. Daughter has a call into [facility] to see what treatment patient can receive.  Main issue this morning is patient's HR is 38-40. Patient has history of known heart block and pacemaker was deferred. Daughter really wants to get patient somewhere today for evaluation whether it is the emergency room or the behavioral health.  Advised I will discuss with Dr End and call her back with further advice.

## 2020-05-03 ENCOUNTER — Ambulatory Visit (INDEPENDENT_AMBULATORY_CARE_PROVIDER_SITE_OTHER): Payer: Medicare Other | Admitting: Internal Medicine

## 2020-05-03 ENCOUNTER — Encounter: Payer: Self-pay | Admitting: Internal Medicine

## 2020-05-03 ENCOUNTER — Other Ambulatory Visit: Payer: Self-pay

## 2020-05-03 VITALS — BP 136/52 | HR 63 | Ht 65.0 in | Wt 194.2 lb

## 2020-05-03 DIAGNOSIS — I2510 Atherosclerotic heart disease of native coronary artery without angina pectoris: Secondary | ICD-10-CM | POA: Diagnosis not present

## 2020-05-03 DIAGNOSIS — I459 Conduction disorder, unspecified: Secondary | ICD-10-CM

## 2020-05-03 DIAGNOSIS — I350 Nonrheumatic aortic (valve) stenosis: Secondary | ICD-10-CM

## 2020-05-03 NOTE — Progress Notes (Signed)
Follow-up Outpatient Visit Date: 05/03/2020  Primary Care Provider: Jerl Mina, MD [address] [facility] [place] [address]  Chief Complaint: Follow-up bradycardia and aortic stenosis  HPI:  Timothy Ramirez is a 87 y.o. male with history of moderate aortic stenosis, hypertension, hyperlipidemia, and intermittent 2:1 AV block, who presents for follow-up of shortness of breath and fatigue.  I last saw him in early December, at which time Timothy Ramirez was feeling relatively well.  We discussed proceeding with catheterization to exclude coronary artery disease and further assess his aortic stenosis.  We initially plan to defer this until the new year, though Timothy Ramirez elected to perform this more urgently.  Catheterization in mid December showed mild nonobstructive CAD and moderate (approaching severe) aortic stenosis with mean gradient of 37 mmHg and valve area of 1.1 cm).  Right and left heart filling pressures were normal.  PFTs obtained by Dr. Graciela Husbands in January were normal.  His family reached out to [place] last week regarding intermittent low heart rates and concern for alcohol withdrawal.  He was advised to go to the emergency department where rated cardia with intermittent 2:1 AV block was again noted.  Work-up was otherwise unrevealing; the patient was discharged from the ED.  Today, Timothy Ramirez reports that he is feeling fairly well.  However, he notes exertional dyspnea with mild activity.  He had a single episode of myalgias and chest pain last week, which resolved spontaneously.  He denies palpitations, lightheadedness, syncope, and edema.  Delirium that led to ED visit last week has resolved.  --------------------------------------------------------------------------------------------------  Past Medical History:  Diagnosis Date  . Aortic stenosis    Moderate (mean gradient 37, AVA 1.1 by cath in 01/2020)  . Borderline diabetes   . Coronary artery disease 01/2020   Mild,  nonobstructive dz by cath  . Hyperlipidemia   . Hypertension   . Second degree AV block    Past Surgical History:  Procedure Laterality Date  . APPENDECTOMY    . HERNIA REPAIR    . RIGHT/LEFT HEART CATH AND CORONARY ANGIOGRAPHY N/A 02/03/2020   Procedure: RIGHT/LEFT HEART CATH AND CORONARY ANGIOGRAPHY;  Surgeon: Yvonne Kendall, MD;  Location: [facility] INVASIVE CV LAB;  Service: Cardiovascular;  Laterality: N/A;  . TONSILLECTOMY      Current Meds  Medication Sig  . aspirin EC 81 MG tablet Take 81 mg by mouth daily. Swallow whole.  Marland Kitchen atorvastatin (LIPITOR) 20 MG tablet Take 20 mg by mouth daily.  . citalopram (CELEXA) 20 MG tablet Take 20 mg by mouth daily.  . hydrochlorothiazide (HYDRODIURIL) 12.5 MG tablet Take 1 tablet (12.5 mg total) by mouth daily.  . potassium chloride SA (KLOR-CON) 20 MEQ tablet Take 20 mEq by mouth daily.  . rivastigmine (EXELON) 1.5 MG capsule Take 1.5 mg by mouth 2 (two) times daily.  . temazepam (RESTORIL) 30 MG capsule Take 30 mg by mouth at bedtime.    Allergies: Patient has no known allergies.  Social History   Tobacco Use  . Smoking status: Never Smoker  . Smokeless tobacco: Never Used  Vaping Use  . Vaping Use: Never used  Substance Use Topics  . Alcohol use: Yes    Alcohol/week: 2.0 standard drinks    Types: 1 Cans of beer, 1 Shots of liquor per week    Comment: last drink was Saturday, 04/22/2020.  . Drug use: No    Family History  Problem Relation Age of Onset  . Congestive Heart Failure Mother   .  Heart attack Mother 37  . Cirrhosis Father   . COPD Sister     Review of Systems: A 12-system review of systems was performed and was negative except as noted in the HPI.  --------------------------------------------------------------------------------------------------  Physical Exam: BP (!) 136/52 (BP Location: Left Arm, Patient Position: Sitting, Cuff Size: Normal)   Pulse 63   Ht 5\' 5"  (1.651 m)   Wt 194 lb 4 oz (88.1 kg)   SpO2  98%   BMI 32.32 kg/m   General:  NAD. Neck: No JVD or HJR. Lungs: Clear to auscultation bilaterally without wheezes or crackles. Heart: Regular rate and rhythm with 2/6 systolic murmur.  No rubs or gallops. Abdomen: Soft, nontender, nondistended. Extremities: No lower extremity edema.  EKG: Normal sinus rhythm with left axis deviation, right bundle branch block, and nonspecific T wave changes.  No significant change from prior tracing on 04/27/2020.  Lab Results  Component Value Date   WBC 7.0 04/27/2020   HGB 15.8 04/27/2020   HCT 45.0 04/27/2020   MCV 91.5 04/27/2020   PLT 142 (L) 04/27/2020    Lab Results  Component Value Date   NA 137 04/27/2020   K 4.4 04/27/2020   CL 106 04/27/2020   CO2 23 04/27/2020   BUN 23 04/27/2020   CREATININE 1.16 04/27/2020   GLUCOSE 119 (H) 04/27/2020   ALT 19 12/23/2019    --------------------------------------------------------------------------------------------------  ASSESSMENT AND PLAN: 2:1 AV block: This has been intermittently present for years and is likely driving some of Timothy Ramirez symptoms as extensive work-up for other underlying causes including coronary artery disease, severe aortic stenosis, and pulmonary disease have failed to explain his symptoms.  I spoke with Timothy Ramirez and his daughter at length regarding my concern that his bradycardia and heart block are leading to his symptoms and will likely only get worse as his conduction disease progresses with time.  Timothy Ramirez and his daughter are agreeable with pacemaker placement, which has been addressed several times with Dr. Orson Slick.  I have arranged for Timothy Ramirez and his daughter to meet with Dr. Orson Slick on 05/18/2020 in order to make plans for pacemaker placement.  In the meantime, we will continue avoidance of all AV nodal blocking agents.  Aortic stenosis: Moderate by echo and cath last year.  I do not believe this is the primary cause of Timothy Ramirez's symptoms.  We will  continue clinical and echocardiographic follow-up.  Nonobstructive coronary artery disease: Mild by catheterization in December.  Continue medical therapy and risk factor modification.  Follow-up: Return to clinic in 3 months.  January, MD 05/05/2020 7:52 AM

## 2020-05-03 NOTE — Patient Instructions (Signed)
Medication Instructions:  Your physician recommends that you continue on your current medications as directed. Please refer to the Current Medication list given to you today.  *If you need a refill on your cardiac medications before your next appointment, please call your pharmacy*  Follow-Up: At [facility], you and your health needs are our priority.  As part of our continuing mission to provide you with exceptional heart care, we have created designated Provider Care Teams.  These Care Teams include your primary Cardiologist (physician) and Advanced Practice Providers (APPs -  Physician Assistants and Nurse Practitioners) who all work together to provide you with the care you need, when you need it.  We recommend signing up for the patient portal called "MyChart".  Sign up information is provided on this After Visit Summary.  MyChart is used to connect with patients for Virtual Visits (Telemedicine).  Patients are able to view lab/test results, encounter notes, upcoming appointments, etc.  Non-urgent messages can be sent to your provider as well.   To learn more about what you can do with MyChart, go to [email or web address].    Your next appointment:   3 month(s)  The format for your next appointment:   In Person  Provider:   You may see Yvonne Kendall, MD or one of the following Advanced Practice Providers on your designated Care Team:    Nicolasa Ducking, NP  Eula Listen, PA-C  Marisue Ivan, PA-C  Cadence Daniel, [place]  Gillian Shields, NP   Your physician recommends that you schedule a follow-up appointment in: [facility] ON 05/18/20 AT 8:40 AM FOR VIRTUAL VISIT.

## 2020-05-05 ENCOUNTER — Encounter: Payer: Self-pay | Admitting: Internal Medicine

## 2020-05-05 DIAGNOSIS — I2510 Atherosclerotic heart disease of native coronary artery without angina pectoris: Secondary | ICD-10-CM | POA: Insufficient documentation

## 2020-05-05 DIAGNOSIS — I459 Conduction disorder, unspecified: Secondary | ICD-10-CM | POA: Insufficient documentation

## 2020-05-10 NOTE — Addendum Note (Signed)
Addended by: Festus Aloe on: 05/10/2020 02:38 PM   Modules accepted: Orders

## 2020-05-18 ENCOUNTER — Other Ambulatory Visit: Payer: Self-pay

## 2020-05-18 ENCOUNTER — Telehealth: Payer: Self-pay | Admitting: *Deleted

## 2020-05-18 ENCOUNTER — Telehealth (INDEPENDENT_AMBULATORY_CARE_PROVIDER_SITE_OTHER): Payer: Medicare Other | Admitting: Internal Medicine

## 2020-05-18 ENCOUNTER — Encounter: Payer: Self-pay | Admitting: Internal Medicine

## 2020-05-18 VITALS — HR 41 | Ht 65.0 in | Wt 199.0 lb

## 2020-05-18 DIAGNOSIS — I441 Atrioventricular block, second degree: Secondary | ICD-10-CM

## 2020-05-18 DIAGNOSIS — I2510 Atherosclerotic heart disease of native coronary artery without angina pectoris: Secondary | ICD-10-CM | POA: Diagnosis not present

## 2020-05-18 NOTE — Progress Notes (Signed)
Electrophysiology TeleHealth Note   Due to national recommendations of social distancing due to COVID 19, an audio/video telehealth visit is felt to be most appropriate for this patient at this time.  See MyChart message from today for the patient's consent to telehealth for [facility].   Date:  05/18/2020   ID:  Timothy Ramirez, DOB 1933/12/27, MRN [id number]  Location: patient's home  Provider location: [address] [place]  Evaluation Performed: Follow-up visit  PCP:  Jerl Mina, MD  Cardiologist:   CE Electrophysiologist:  SK   Chief Complaint:  dyspnea  History of Present Illness:    Timothy Ramirez is a 87 y.o. male who presents via audio/video conferencing for a telehealth visit today.  Since last being seen in our clinic for bradycardia associated with 2: 1 heart block. History of episodic and unpredictable dyspnea.  History of aortic stenosis mod-sev, the patient reports intermittent dyspnea.  His daughter however notes that there has been significant interval change in mental status over the last couple of weeks such that he does not anymore remember which bathtub he should be using, where he is going or has gone for medial etc. neurology appointment scheduled for next week  EMS 2/2 short of breath>> HR 35-40 at that time  But also his breathing is okay this morning and his heart rate is 41  Date Cr K Hgb  9/21 1.10 3.2>4.2 16.8  12/21 1.16 3.4<<3.5     PFTs 1/22 were normal   The patient denies symptoms of fevers, chills, cough, or new SOB worrisome for COVID 19.    Past Medical History:  Diagnosis Date  . Aortic stenosis    Moderate (mean gradient 37, AVA 1.1 by cath in 01/2020)  . Borderline diabetes   . Coronary artery disease 01/2020   Mild, nonobstructive dz by cath  . Hyperlipidemia   . Hypertension   . Second degree AV block     Past Surgical History:  Procedure Laterality Date  . APPENDECTOMY    . HERNIA REPAIR    .  RIGHT/LEFT HEART CATH AND CORONARY ANGIOGRAPHY N/A 02/03/2020   Procedure: RIGHT/LEFT HEART CATH AND CORONARY ANGIOGRAPHY;  Surgeon: Yvonne Kendall, MD;  [facility];  Service: Cardiovascular;  Laterality: N/A;  . TONSILLECTOMY      Current Outpatient Medications  Medication Sig Dispense Refill  . aspirin EC 81 MG tablet Take 81 mg by mouth daily. Swallow whole.    Marland Kitchen atorvastatin (LIPITOR) 20 MG tablet Take 20 mg by mouth daily.    . citalopram (CELEXA) 20 MG tablet Take 20 mg by mouth daily.    . hydrochlorothiazide (HYDRODIURIL) 12.5 MG tablet Take 1 tablet (12.5 mg total) by mouth daily. 90 tablet 1  . potassium chloride SA (KLOR-CON) 20 MEQ tablet Take 20 mEq by mouth daily.    . rivastigmine (EXELON) 1.5 MG capsule Take 1.5 mg by mouth 2 (two) times daily.    . temazepam (RESTORIL) 30 MG capsule Take 30 mg by mouth at bedtime.     No current facility-administered medications for this visit.    Allergies:   Patient has no known allergies.   Social History:  The patient  reports that he has never smoked. He has never used smokeless tobacco. He reports current alcohol use of about 2.0 standard drinks of alcohol per week. He reports that he does not use drugs.   Family History:  The patient's   family  history includes COPD in his sister; Cirrhosis in his father; Congestive Heart Failure in his mother; Heart attack (age of onset: 76) in his mother.   ROS:  Please see the history of present illness.   All other systems are personally reviewed and negative.    Exam:    Vital Signs:  Pulse (!) 41   Ht 5\' 5"  (1.651 m)   Wt 199 lb (90.3 kg)   SpO2 95%   BMI 33.12 kg/m     No acute distress, skin color good Verbally engaging  Labs/Other Tests and Data Reviewed:    Recent Labs: 11/07/2019: TSH 2.553 12/23/2019: ALT 19; Magnesium 2.1 04/27/2020: BUN 23; Creatinine, Ser 1.16; Hemoglobin 15.8; Platelets 142; Potassium 4.4; Sodium 137   Wt Readings from Last 3  Encounters:  05/18/20 199 lb (90.3 kg)  05/03/20 194 lb 4 oz (88.1 kg)  02/10/20 191 lb 8 oz (86.9 kg)     Other studies personally reviewed: Additional studies/ records that were reviewed today include:   ASSESSMENT & PLAN:   Dyspnea on exertion-intermittent  Mobitz 2 first-degree heart block with daytime rates in the high 30s low 40s  Right bundle branch block  Aortic stenosis-moderate  Dementia-progressive The patient has second-degree heart block in the context of aortic stenosis and baseline right bundle branch block.  He is certainly at risk for progressive conduction system disease secondary to his aortic stenosis but as of this point has not had syncope.  His not so clear that his dyspnea is related to his bradycardia, not withstanding the recent EMS visit where his heart rate was in the mid 30s, his heart rate this morning is in the low 40s now without symptoms.  Given the unstable neurological process for which evaluation is planned next week we have elected to hold off for at least a week prior to making a decision regarding proceeding with pacing.  His dyspnea is not limiting and in the absence of symptoms to suggest intermittent higher grade heart block i.e. presyncope, there is no immediate indication for proceeding with pacing.  His daughter is agreeable that we should reassess following the neurological and prognostic assessment  COVID 19 screen The patient denies symptoms of COVID 19 at this time.  The importance of social distancing was discussed today.  Follow-up:  Next week  Current medicines are reviewed at length with the patient today.   The patient does not have concerns regarding his medicines.  The following changes were made today:  none  Labs/ tests ordered today include:   No orders of the defined types were placed in this encounter.   Future tests ( post COVID )     Patient Risk:  after full review of this patients clinical status, I feel that  they are at moderate risk at this time.  Today, I have spent 16 minutes with the patient with telehealth technology discussing the above.  Signed, 02/12/20, MD  05/18/2020 9:02 AM     [facility] [address] [place] [phone] (office) [phone] (fax)

## 2020-05-18 NOTE — Telephone Encounter (Signed)
  Patient Consent for Virtual Visit         Timothy Ramirez has provided verbal consent on 05/18/2020 for a virtual visit (video or telephone).   CONSENT FOR VIRTUAL VISIT FOR:  Timothy Ramirez  By participating in this virtual visit I agree to the following:  I hereby voluntarily request, consent and authorize [facility] and its employed or contracted physicians, physician assistants, nurse practitioners or other licensed health care professionals (the Practitioner), to provide me with telemedicine health care services (the "Services") as deemed necessary by the treating Practitioner. I acknowledge and consent to receive the Services by the Practitioner via telemedicine. I understand that the telemedicine visit will involve communicating with the Practitioner through live audiovisual communication technology and the disclosure of certain medical information by electronic transmission. I acknowledge that I have been given the opportunity to request an in-person assessment or other available alternative prior to the telemedicine visit and am voluntarily participating in the telemedicine visit.  I understand that I have the right to withhold or withdraw my consent to the use of telemedicine in the course of my care at any time, without affecting my right to future care or treatment, and that the Practitioner or I may terminate the telemedicine visit at any time. I understand that I have the right to inspect all information obtained and/or recorded in the course of the telemedicine visit and may receive copies of available information for a reasonable fee.  I understand that some of the potential risks of receiving the Services via telemedicine include:  Marland Kitchen Delay or interruption in medical evaluation due to technological equipment failure or disruption; . Information transmitted may not be sufficient (e.g. poor resolution of images) to allow for appropriate medical decision making by the Practitioner;  and/or  . In rare instances, security protocols could fail, causing a breach of personal health information.  Furthermore, I acknowledge that it is my responsibility to provide information about my medical history, conditions and care that is complete and accurate to the best of my ability. I acknowledge that Practitioner's advice, recommendations, and/or decision may be based on factors not within their control, such as incomplete or inaccurate data provided by me or distortions of diagnostic images or specimens that may result from electronic transmissions. I understand that the practice of medicine is not an exact science and that Practitioner makes no warranties or guarantees regarding treatment outcomes. I acknowledge that a copy of this consent can be made available to me via my patient portal [facility] MyChart), or I can request a printed copy by calling the office of [facility].    I understand that my insurance will be billed for this visit.   I have read or had this consent read to me. . I understand the contents of this consent, which adequately explains the benefits and risks of the Services being provided via telemedicine.  . I have been provided ample opportunity to ask questions regarding this consent and the Services and have had my questions answered to my satisfaction. . I give my informed consent for the services to be provided through the use of telemedicine in my medical care

## 2020-05-18 NOTE — Patient Instructions (Signed)
Medication Instructions:  - Your physician recommends that you continue on your current medications as directed. Please refer to the Current Medication list given to you today.  *If you need a refill on your cardiac medications before your next appointment, please call your pharmacy*   Lab Work: - none ordered  If you have labs (blood work) drawn today and your tests are completely normal, you will receive your results only by: Marland Kitchen MyChart Message (if you have MyChart) OR . A paper copy in the mail If you have any lab test that is abnormal or we need to change your treatment, we will call you to review the results.   Testing/Procedures: - none ordered   Follow-Up: At [facility], you and your health needs are our priority.  As part of our continuing mission to provide you with exceptional heart care, we have created designated Provider Care Teams.  These Care Teams include your primary Cardiologist (physician) and Advanced Practice Providers (APPs -  Physician Assistants and Nurse Practitioners) who all work together to provide you with the care you need, when you need it.  We recommend signing up for the patient portal called "MyChart".  Sign up information is provided on this After Visit Summary.  MyChart is used to connect with patients for Virtual Visits (Telemedicine).  Patients are able to view lab/test results, encounter notes, upcoming appointments, etc.  Non-urgent messages can be sent to your provider as well.   To learn more about what you can do with MyChart, go to [email or web address].    Your next appointment:   1 week(s)  The format for your next appointment:   Virtual Visit   Provider:   Sherryl Manges, MD   Other Instructions n/a

## 2020-05-25 ENCOUNTER — Other Ambulatory Visit: Payer: Self-pay | Admitting: Neurology

## 2020-05-25 ENCOUNTER — Other Ambulatory Visit: Payer: Self-pay

## 2020-05-25 ENCOUNTER — Other Ambulatory Visit (HOSPITAL_COMMUNITY): Payer: Self-pay | Admitting: Neurology

## 2020-05-25 ENCOUNTER — Telehealth (INDEPENDENT_AMBULATORY_CARE_PROVIDER_SITE_OTHER): Payer: Medicare Other | Admitting: Internal Medicine

## 2020-05-25 VITALS — BP 132/56 | HR 60 | Ht 68.0 in | Wt 195.0 lb

## 2020-05-25 DIAGNOSIS — F03A0 Unspecified dementia, mild, without behavioral disturbance, psychotic disturbance, mood disturbance, and anxiety: Secondary | ICD-10-CM

## 2020-05-25 DIAGNOSIS — I441 Atrioventricular block, second degree: Secondary | ICD-10-CM | POA: Diagnosis not present

## 2020-05-25 DIAGNOSIS — R001 Bradycardia, unspecified: Secondary | ICD-10-CM

## 2020-05-25 DIAGNOSIS — F0390 Unspecified dementia without behavioral disturbance: Secondary | ICD-10-CM

## 2020-05-25 DIAGNOSIS — I2510 Atherosclerotic heart disease of native coronary artery without angina pectoris: Secondary | ICD-10-CM

## 2020-05-25 NOTE — Patient Instructions (Signed)
Medication Instructions:  Your physician recommends that you continue on your current medications as directed. Please refer to the Current Medication list given to you today.  *If you need a refill on your cardiac medications before your next appointment, please call your pharmacy*   Lab Work: None ordered.  If you have labs (blood work) drawn today and your tests are completely normal, you will receive your results only by: Marland Kitchen MyChart Message (if you have MyChart) OR . A paper copy in the mail If you have any lab test that is abnormal or we need to change your treatment, we will call you to review the results.   Testing/Procedures: None ordered.  Follow-Up: At [facility], you and your health needs are our priority.  As part of our continuing mission to provide you with exceptional heart care, we have created designated Provider Care Teams.  These Care Teams include your primary Cardiologist (physician) and Advanced Practice Providers (APPs -  Physician Assistants and Nurse Practitioners) who all work together to provide you with the care you need, when you need it.  We recommend signing up for the patient portal called "MyChart".  Sign up information is provided on this After Visit Summary.  MyChart is used to connect with patients for Virtual Visits (Telemedicine).  Patients are able to view lab/test results, encounter notes, upcoming appointments, etc.  Non-urgent messages can be sent to your provider as well.   To learn more about what you can do with MyChart, go to [email or web address].    Your next appointment:  Virtual visit with Dr Graciela Husbands Thursday, 06/29/2020 at 2:15pm

## 2020-05-25 NOTE — Progress Notes (Signed)
Electrophysiology TeleHealth Note   Due to national recommendations of social distancing due to COVID 19, an audio/video telehealth visit is felt to be most appropriate for this patient at this time.  See MyChart message from today for the patient's consent to telehealth for [facility].   Date:  05/27/2020   ID:  Timothy Ramirez, DOB 04-22-1933, MRN [id number]  Location: patient's home  Provider location: [address], [place]  Evaluation Performed: Follow-up visit  PCP:  Jerl Mina, MD  Cardiologist:   CE Electrophysiologist:  SK   Chief Complaint:  dyspnea  History of Present Illness:    Timothy Ramirez is a 87 y.o. male who presents via audio/video conferencing for a telehealth visit today.  Since last being seen in our clinic for bradycardia associated with 2: 1 heart block. History of episodic and unpredictable dyspnea.  History of aortic stenosis mod-sev, the patient reports intermittent dyspnea.  His daughter reports that the neurologist identified Exelon as a drug associated with bradycardia and stopped it.  Heart rate is now in the 60s.  Feeling some better  Date Cr K Hgb  9/21 1.10 3.2>4.2 16.8  12/21 1.16 3.4<<3.5     PFTs 1/22 were normal   The patient denies symptoms of fevers, chills, cough, or new SOB worrisome for COVID 19.    Past Medical History:  Diagnosis Date  . Aortic stenosis    Moderate (mean gradient 37, AVA 1.1 by cath in 01/2020)  . Borderline diabetes   . Coronary artery disease 01/2020   Mild, nonobstructive dz by cath  . Hyperlipidemia   . Hypertension   . Second degree AV block     Past Surgical History:  Procedure Laterality Date  . APPENDECTOMY    . HERNIA REPAIR    . RIGHT/LEFT HEART CATH AND CORONARY ANGIOGRAPHY N/A 02/03/2020   Procedure: RIGHT/LEFT HEART CATH AND CORONARY ANGIOGRAPHY;  Surgeon: Yvonne Kendall, MD;  Location: [facility];  Service: Cardiovascular;  Laterality: N/A;  .  TONSILLECTOMY      Current Outpatient Medications  Medication Sig Dispense Refill  . aspirin EC 81 MG tablet Take 81 mg by mouth daily. Swallow whole.    Marland Kitchen atorvastatin (LIPITOR) 20 MG tablet Take 20 mg by mouth daily.    . Cholecalciferol (D3-1000 PO) Take by mouth.    . citalopram (CELEXA) 20 MG tablet Take 20 mg by mouth daily.    . hydrochlorothiazide (HYDRODIURIL) 12.5 MG tablet Take 1 tablet (12.5 mg total) by mouth daily. 90 tablet 1  . potassium chloride SA (KLOR-CON) 20 MEQ tablet Take 20 mEq by mouth daily.    . temazepam (RESTORIL) 30 MG capsule Take 30 mg by mouth at bedtime.    . vitamin B-12 (CYANOCOBALAMIN) 1000 MCG tablet Take 1,000 mcg by mouth daily.    . rivastigmine (EXELON) 1.5 MG capsule Take 1.5 mg by mouth 2 (two) times daily. (Patient not taking: Reported on 05/25/2020)     No current facility-administered medications for this visit.    Allergies:   Patient has no known allergies.   Social History:  The patient  reports that he has never smoked. He has never used smokeless tobacco. He reports current alcohol use of about 2.0 standard drinks of alcohol per week. He reports that he does not use drugs.   Family History:  The patient's   family history includes COPD in his sister; Cirrhosis in his father; Congestive Heart Failure in  his mother; Heart attack (age of onset: 57) in his mother.   ROS:  Please see the history of present illness.   All other systems are personally reviewed and negative.    Exam:    Vital Signs:  BP (!) 132/56   Pulse 60   Ht 5\' 8"  (1.727 m)   Wt 195 lb (88.5 kg)   BMI 29.65 kg/m     No acute distress, skin color good Verbally engaging  Labs/Other Tests and Data Reviewed:    Recent Labs: 11/07/2019: TSH 2.553 12/23/2019: ALT 19; Magnesium 2.1 04/27/2020: BUN 23; Creatinine, Ser 1.16; Hemoglobin 15.8; Platelets 142; Potassium 4.4; Sodium 137   Wt Readings from Last 3 Encounters:  05/25/20 195 lb (88.5 kg)  05/18/20 199 lb (90.3  kg)  05/03/20 194 lb 4 oz (88.1 kg)     Other studies personally reviewed: Additional studies/ records that were reviewed today include:   ASSESSMENT & PLAN:   Dyspnea on exertion-intermittent  Mobitz 2 first-degree heart block with daytime rates in the high 30s low 40s  Right bundle branch block  Aortic stenosis-moderate  Dementia-progressive   I failed to appreciate if he was on the cholinesterase inhibitor Exelon, which has been well known to be associated with bradycardia.  He is better off of it and I am grateful to the neurologist  COVID 19 screen The patient denies symptoms of COVID 19 at this time.  The importance of social distancing was discussed today.  Follow-up:  Next week  Current medicines are reviewed at length with the patient today.   The patient does not have concerns regarding his medicines.  The following changes were made today:  none  Labs/ tests ordered today include:   No orders of the defined types were placed in this encounter.   Future tests ( post COVID )     Patient Risk:  after full review of this patients clinical status, I feel that they are at moderate risk at this time.  Today, I have spent 16 minutes with the patient with telehealth technology discussing the above.  Signed, 05/05/20, MD  05/27/2020 3:26 PM     [facility] [address] [place] [phone] (office) [phone] (fax)

## 2020-06-06 ENCOUNTER — Ambulatory Visit (HOSPITAL_COMMUNITY)
Admission: RE | Admit: 2020-06-06 | Discharge: 2020-06-06 | Disposition: A | Discharge status: Home / Self Care | Payer: Medicare Other | Source: Ambulatory Visit | Attending: Neurology | Admitting: Neurology

## 2020-06-06 ENCOUNTER — Other Ambulatory Visit: Payer: Self-pay

## 2020-06-06 ENCOUNTER — Encounter (HOSPITAL_COMMUNITY): Payer: Self-pay | Admitting: *Deleted

## 2020-06-06 DIAGNOSIS — F03A0 Unspecified dementia, mild, without behavioral disturbance, psychotic disturbance, mood disturbance, and anxiety: Secondary | ICD-10-CM

## 2020-06-06 DIAGNOSIS — F0390 Unspecified dementia without behavioral disturbance: Secondary | ICD-10-CM | POA: Insufficient documentation

## 2020-06-18 DIAGNOSIS — I442 Atrioventricular block, complete: Secondary | ICD-10-CM

## 2020-06-18 HISTORY — DX: Atrioventricular block, complete: I44.2

## 2020-06-29 ENCOUNTER — Telehealth (INDEPENDENT_AMBULATORY_CARE_PROVIDER_SITE_OTHER): Payer: Medicare Other | Admitting: Internal Medicine

## 2020-06-29 ENCOUNTER — Other Ambulatory Visit: Payer: Self-pay

## 2020-06-29 ENCOUNTER — Encounter: Payer: Self-pay | Admitting: Internal Medicine

## 2020-06-29 VITALS — BP 142/64 | HR 62 | Ht 67.0 in | Wt 193.0 lb

## 2020-06-29 DIAGNOSIS — R001 Bradycardia, unspecified: Secondary | ICD-10-CM

## 2020-06-29 DIAGNOSIS — I441 Atrioventricular block, second degree: Secondary | ICD-10-CM | POA: Diagnosis not present

## 2020-06-29 DIAGNOSIS — I2510 Atherosclerotic heart disease of native coronary artery without angina pectoris: Secondary | ICD-10-CM

## 2020-06-29 NOTE — Patient Instructions (Signed)
Medication Instructions:  ?Your physician recommends that you continue on your current medications as directed. Please refer to the Current Medication list given to you today. ? ?*If you need a refill on your cardiac medications before your next appointment, please call your pharmacy* ? ? ?Lab Work: ?None ordered. ? ?If you have labs (blood work) drawn today and your tests are completely normal, you will receive your results only by: ?MyChart Message (if you have MyChart) OR ?A paper copy in the mail ?If you have any lab test that is abnormal or we need to change your treatment, we will call you to review the results. ? ? ?Testing/Procedures: ?None ordered. ? ? ? ?Follow-Up: ?At [facility], you and your health needs are our priority.  As part of our continuing mission to provide you with exceptional heart care, we have created designated Provider Care Teams.  These Care Teams include your primary Cardiologist (physician) and Advanced Practice Providers (APPs -  Physician Assistants and Nurse Practitioners) who all work together to provide you with the care you need, when you need it. ? ?We recommend signing up for the patient portal called "MyChart".  Sign up information is provided on this After Visit Summary.  MyChart is used to connect with patients for Virtual Visits (Telemedicine).  Patients are able to view lab/test results, encounter notes, upcoming appointments, etc.  Non-urgent messages can be sent to your provider as well.   ?To learn more about what you can do with MyChart, go to [email or web address].   ? ?Your next appointment:   ?Follow up as needed with Dr Klein ?

## 2020-06-29 NOTE — Progress Notes (Signed)
Return 21 days from now is 33rd which is leads    Electrophysiology TeleHealth Note   Due to national recommendations of social distancing due to COVID 19, an audio/video telehealth visit is felt to be most appropriate for this patient at this time.  See MyChart message from today for the patient's consent to telehealth for [facility].   Date:  06/29/2020   ID:  Timothy Ramirez, DOB 04-29-1933, MRN [id number]  Location: patient's home  Provider location: [address], [place]  Evaluation Performed: Follow-up visit  PCP:  Jerl Mina, MD    Electrophysiologist:  SK   Chief Complaint:  *    History of Present Illness:    Timothy Ramirez is a 87 y.o. male who presents via audio/video conferencing for a telehealth visit today.  Since last being seen in our clinic for 12/21 for bradycardia associated with 2: 1 heart block in the setting of moderate to severe aortic stenosis, intermittent problems with dyspnea for which the family wanted to exclude a pulmonary cause and thus with an initial decision to defer pacing given the paucity of symptoms in a gentleman who is largely nonambulatory the patient, and then a further evaluation 3/22 at the recommendation of Dr. Okey Dupre strongly supporting proceeding with pacing-- neuro identified the potential of EXELON as being the culprit and with its discontinuation heart rates have been stable now in the 60s for about a month.  The family is desiring at this point to do nothing about pacing     The patient denies symptoms of fevers, chills, cough, or new SOB worrisome for COVID 19.    Past Medical History:  Diagnosis Date  . Aortic stenosis    Moderate (mean gradient 37, AVA 1.1 by cath in 01/2020)  . Borderline diabetes   . Coronary artery disease 01/2020   Mild, nonobstructive dz by cath  . Hyperlipidemia   . Hypertension   . Second degree AV block     Past Surgical History:  Procedure Laterality Date  . APPENDECTOMY     . HERNIA REPAIR    . RIGHT/LEFT HEART CATH AND CORONARY ANGIOGRAPHY N/A 02/03/2020   Procedure: RIGHT/LEFT HEART CATH AND CORONARY ANGIOGRAPHY;  Surgeon: Yvonne Kendall, MD;  Location: [facility];  Service: Cardiovascular;  Laterality: N/A;  . TONSILLECTOMY      Current Outpatient Medications  Medication Sig Dispense Refill  . aspirin EC 81 MG tablet Take 81 mg by mouth daily. Swallow whole.    Marland Kitchen atorvastatin (LIPITOR) 20 MG tablet Take 20 mg by mouth daily.    . Cholecalciferol (D3-1000 PO) Take by mouth.    . citalopram (CELEXA) 20 MG tablet Take 20 mg by mouth daily.    . hydrochlorothiazide (HYDRODIURIL) 12.5 MG tablet Take 1 tablet (12.5 mg total) by mouth daily. 90 tablet 1  . memantine (NAMENDA) 5 MG tablet Take 5 mg by mouth 2 (two) times daily.    . potassium chloride SA (KLOR-CON) 20 MEQ tablet Take 20 mEq by mouth daily.    . temazepam (RESTORIL) 30 MG capsule Take 30 mg by mouth at bedtime.    . vitamin B-12 (CYANOCOBALAMIN) 1000 MCG tablet Take 1,000 mcg by mouth daily.     No current facility-administered medications for this visit.    Allergies:   Patient has no known allergies.   Social History:  The patient  reports that he has never smoked. He has never used smokeless tobacco. He reports current alcohol  use of about 2.0 standard drinks of alcohol per week. He reports that he does not use drugs.   Family History:  The patient's   family history includes COPD in his sister; Cirrhosis in his father; Congestive Heart Failure in his mother; Heart attack (age of onset: 18) in his mother.   ROS:  Please see the history of present illness.   All other systems are personally reviewed and negative.    Exam:    Vital Signs:  BP (!) 142/64   Pulse 62   Ht 5\' 7"  (1.702 m)   Wt 193 lb (87.5 kg)   SpO2 95%   BMI 30.23 kg/m     Well appearing, alert and conversant, regular work of breathing,  good skin color Eyes- anicteric, neuro- grossly intact, skin- no  apparent rash or lesions or cyanosis, mouth- oral mucosa is pink   Labs/Other Tests and Data Reviewed:    Recent Labs: 11/07/2019: TSH 2.553 12/23/2019: ALT 19; Magnesium 2.1 04/27/2020: BUN 23; Creatinine, Ser 1.16; Hemoglobin 15.8; Platelets 142; Potassium 4.4; Sodium 137   Wt Readings from Last 3 Encounters:  06/29/20 193 lb (87.5 kg)  05/25/20 195 lb (88.5 kg)  05/18/20 199 lb (90.3 kg)     Other studies personally reviewed: Additional studies/ records that were reviewed today include:        ASSESSMENT & PLAN:    Dyspnea on exertion-intermittent  Mobitz 2 first-degree heart block with daytime rates in the high 30s low 40s resolved by heart rate measurements with the discontinuation of Exelon  Right bundle branch block  Aortic stenosis-moderate  Dementia-progressive   No further EP follow-up as heart rates are improved and they do not desire pacing COVID 19 screen The patient denies symptoms of COVID 19 at this time.  The importance of social distancing was discussed today.  Follow-up:  prnm     Current medicines are reviewed at length with the patient today.   The patient  concerns regarding his medicines.  The following changes were made today:    Labs/ tests ordered today include:  No orders of the defined types were placed in this encounter.   Future tests ( post COVID )   in   months  Patient Risk:  after full review of this patients clinical status, I feel that they are at moderate risk at this time.  Today, I have spent 9 minutes with the patient with telehealth technology discussing the above.  Signed, 05/20/20, MD  06/29/2020 6:16 PM     [facility] [address] [place] [phone] (office) [phone] (fax)

## 2020-07-06 ENCOUNTER — Telehealth: Payer: Medicare Other | Admitting: Internal Medicine

## 2020-07-11 ENCOUNTER — Inpatient Hospital Stay
Admission: EM | Admit: 2020-07-11 | Discharge: 2020-07-12 | DRG: 243 | Disposition: A | Discharge status: Home Health | Payer: Medicare Other | Attending: Internal Medicine | Admitting: Internal Medicine

## 2020-07-11 ENCOUNTER — Emergency Department: Payer: Medicare Other

## 2020-07-11 ENCOUNTER — Encounter: Payer: Self-pay | Admitting: Emergency Medicine

## 2020-07-11 ENCOUNTER — Telehealth: Payer: Self-pay | Admitting: Internal Medicine

## 2020-07-11 ENCOUNTER — Other Ambulatory Visit: Payer: Self-pay

## 2020-07-11 DIAGNOSIS — I442 Atrioventricular block, complete: Secondary | ICD-10-CM | POA: Diagnosis not present

## 2020-07-11 DIAGNOSIS — E785 Hyperlipidemia, unspecified: Secondary | ICD-10-CM | POA: Diagnosis present

## 2020-07-11 DIAGNOSIS — Z825 Family history of asthma and other chronic lower respiratory diseases: Secondary | ICD-10-CM

## 2020-07-11 DIAGNOSIS — F0390 Unspecified dementia without behavioral disturbance: Secondary | ICD-10-CM | POA: Diagnosis not present

## 2020-07-11 DIAGNOSIS — M6281 Muscle weakness (generalized): Secondary | ICD-10-CM | POA: Diagnosis not present

## 2020-07-11 DIAGNOSIS — I2510 Atherosclerotic heart disease of native coronary artery without angina pectoris: Secondary | ICD-10-CM | POA: Diagnosis not present

## 2020-07-11 DIAGNOSIS — Z8379 Family history of other diseases of the digestive system: Secondary | ICD-10-CM | POA: Diagnosis not present

## 2020-07-11 DIAGNOSIS — N179 Acute kidney failure, unspecified: Secondary | ICD-10-CM | POA: Diagnosis not present

## 2020-07-11 DIAGNOSIS — D72829 Elevated white blood cell count, unspecified: Secondary | ICD-10-CM | POA: Diagnosis present

## 2020-07-11 DIAGNOSIS — Z66 Do not resuscitate: Secondary | ICD-10-CM | POA: Diagnosis present

## 2020-07-11 DIAGNOSIS — I10 Essential (primary) hypertension: Secondary | ICD-10-CM | POA: Diagnosis not present

## 2020-07-11 DIAGNOSIS — Z20822 Contact with and (suspected) exposure to covid-19: Secondary | ICD-10-CM | POA: Diagnosis present

## 2020-07-11 DIAGNOSIS — Z8249 Family history of ischemic heart disease and other diseases of the circulatory system: Secondary | ICD-10-CM | POA: Diagnosis not present

## 2020-07-11 DIAGNOSIS — I350 Nonrheumatic aortic (valve) stenosis: Secondary | ICD-10-CM | POA: Diagnosis not present

## 2020-07-11 DIAGNOSIS — Z79899 Other long term (current) drug therapy: Secondary | ICD-10-CM | POA: Diagnosis not present

## 2020-07-11 DIAGNOSIS — E119 Type 2 diabetes mellitus without complications: Secondary | ICD-10-CM | POA: Diagnosis present

## 2020-07-11 DIAGNOSIS — I459 Conduction disorder, unspecified: Secondary | ICD-10-CM

## 2020-07-11 DIAGNOSIS — Z950 Presence of cardiac pacemaker: Secondary | ICD-10-CM

## 2020-07-11 DIAGNOSIS — R001 Bradycardia, unspecified: Secondary | ICD-10-CM | POA: Diagnosis present

## 2020-07-11 DIAGNOSIS — Z7982 Long term (current) use of aspirin: Secondary | ICD-10-CM | POA: Diagnosis not present

## 2020-07-11 DIAGNOSIS — R079 Chest pain, unspecified: Secondary | ICD-10-CM

## 2020-07-11 LAB — RESP PANEL BY RT-PCR (FLU A&B, COVID) ARPGX2
Influenza A by PCR: NEGATIVE
Influenza B by PCR: NEGATIVE
SARS Coronavirus 2 by RT PCR: NEGATIVE

## 2020-07-11 LAB — BASIC METABOLIC PANEL
Anion gap: 11 (ref 5–15)
BUN: 30 mg/dL — ABNORMAL HIGH (ref 8–23)
CO2: 22 mmol/L (ref 22–32)
Calcium: 9 mg/dL (ref 8.9–10.3)
Chloride: 104 mmol/L (ref 98–111)
Creatinine, Ser: 1.64 mg/dL — ABNORMAL HIGH (ref 0.61–1.24)
GFR, Estimated: 41 mL/min — ABNORMAL LOW (ref >60)
Glucose, Bld: 153 mg/dL — ABNORMAL HIGH (ref 70–99)
Potassium: 4.1 mmol/L (ref 3.5–5.1)
Sodium: 137 mmol/L (ref 135–145)

## 2020-07-11 LAB — CBC
HCT: 49.7 % (ref 39.0–52.0)
Hemoglobin: 17.3 g/dL — ABNORMAL HIGH (ref 13.0–17.0)
MCH: 30.8 pg (ref 26.0–34.0)
MCHC: 34.8 g/dL (ref 30.0–36.0)
MCV: 88.4 fL (ref 80.0–100.0)
Platelets: 156 10*3/uL (ref 150–400)
RBC: 5.62 MIL/uL (ref 4.22–5.81)
RDW: 12.7 % (ref 11.5–15.5)
WBC: 11.2 10*3/uL — ABNORMAL HIGH (ref 4.0–10.5)
nRBC: 0 % (ref 0.0–0.2)

## 2020-07-11 LAB — TROPONIN I (HIGH SENSITIVITY): Troponin I (High Sensitivity): 89 ng/L — ABNORMAL HIGH (ref <18)

## 2020-07-11 MED ORDER — POTASSIUM CHLORIDE CRYS ER 20 MEQ PO TBCR
20.0000 meq | EXTENDED_RELEASE_TABLET | Freq: Every day | ORAL | Status: DC
  Filled 2020-07-11: qty 1

## 2020-07-11 MED ORDER — ONDANSETRON HCL 4 MG PO TABS: 4.0000 mg | ORAL_TABLET | Freq: Four times a day (QID) | ORAL | Status: DC | PRN

## 2020-07-11 MED ORDER — VITAMIN B-12 1000 MCG PO TABS
1000.0000 ug | ORAL_TABLET | Freq: Every day | ORAL | Status: DC
  Filled 2020-07-11 (×2): qty 1

## 2020-07-11 MED ORDER — ONDANSETRON HCL 4 MG/2ML IJ SOLN
4.0000 mg | Freq: Four times a day (QID) | INTRAMUSCULAR | Status: DC | PRN
Start: 2020-07-11 — End: 2020-07-12

## 2020-07-11 MED ORDER — ACETAMINOPHEN 650 MG RE SUPP: 650.0000 mg | Freq: Four times a day (QID) | RECTAL | Status: DC | PRN

## 2020-07-11 MED ORDER — SODIUM CHLORIDE 0.9 % IV SOLN: INTRAVENOUS | Status: DC

## 2020-07-11 MED ORDER — TAMSULOSIN HCL 0.4 MG PO CAPS
0.4000 mg | ORAL_CAPSULE | Freq: Every day | ORAL | Status: DC
  Administered 2020-07-11: 0.4 mg via ORAL
  Filled 2020-07-11: qty 1

## 2020-07-11 MED ORDER — SODIUM CHLORIDE 0.9 % IV SOLN
80.0000 mg | INTRAVENOUS | Status: AC
  Administered 2020-07-12: 80 mg
  Filled 2020-07-11: qty 2

## 2020-07-11 MED ORDER — CEFAZOLIN SODIUM-DEXTROSE 2-4 GM/100ML-% IV SOLN
2.0000 g | INTRAVENOUS | Status: AC
  Administered 2020-07-12: 2 g via INTRAVENOUS
  Filled 2020-07-11: qty 100

## 2020-07-11 MED ORDER — CHLORHEXIDINE GLUCONATE 4 % EX LIQD: 60.0000 mL | Freq: Once | CUTANEOUS | Status: DC

## 2020-07-11 MED ORDER — MEMANTINE HCL 5 MG PO TABS
5.0000 mg | ORAL_TABLET | Freq: Two times a day (BID) | ORAL | Status: DC
  Administered 2020-07-11: 5 mg via ORAL
  Filled 2020-07-11 (×3): qty 1

## 2020-07-11 MED ORDER — CITALOPRAM HYDROBROMIDE 20 MG PO TABS
20.0000 mg | ORAL_TABLET | Freq: Every day | ORAL | Status: DC
  Filled 2020-07-11: qty 1

## 2020-07-11 MED ORDER — ATORVASTATIN CALCIUM 20 MG PO TABS
20.0000 mg | ORAL_TABLET | Freq: Every day | ORAL | Status: DC
  Filled 2020-07-11: qty 1

## 2020-07-11 MED ORDER — BISACODYL 10 MG RE SUPP
10.0000 mg | Freq: Every day | RECTAL | Status: DC | PRN
  Filled 2020-07-11: qty 1

## 2020-07-11 MED ORDER — SENNOSIDES-DOCUSATE SODIUM 8.6-50 MG PO TABS: 1.0000 | ORAL_TABLET | Freq: Every evening | ORAL | Status: DC | PRN

## 2020-07-11 MED ORDER — ACETAMINOPHEN 325 MG PO TABS
650.0000 mg | ORAL_TABLET | Freq: Four times a day (QID) | ORAL | Status: DC | PRN
  Administered 2020-07-11 – 2020-07-12 (×2): 650 mg via ORAL
  Filled 2020-07-11 (×2): qty 2

## 2020-07-11 MED ORDER — TEMAZEPAM 15 MG PO CAPS
30.0000 mg | ORAL_CAPSULE | Freq: Every day | ORAL | Status: DC
  Administered 2020-07-11: 30 mg via ORAL
  Filled 2020-07-11: qty 4

## 2020-07-11 NOTE — ED Provider Notes (Addendum)
[facility] Emergency Department Provider Note  ____________________________________________   Event Date/Time   First MD Initiated Contact with Patient 07/11/20 1029     (approximate)  I have reviewed the triage vital signs and the nursing notes.   HISTORY  Chief Complaint Bradycardia  EM caveat, some dementia cognitive difficulty  HPI SPIKE DESILETS is a 87 y.o. male with a history of coronary disease heart block second-degree, dementia  Patient presents today, he had a evaluation at home and nurse noted that he had a low heart rate and seemed a little bit more tired than usual.  The patient himself has some dementia, fairly poor historian   Patient's daughter is here able to provide some history.  They have been considering getting a pacemaker for the patient now for some time and have been debating it in the context of quality of life and his dementia  The patient has not had any fevers chills or recent illness.  No chest pain no trouble breathing.  He denies have any complaints or concerns at this point.  Past Medical History:  Diagnosis Date  . Aortic stenosis    Moderate (mean gradient 37, AVA 1.1 by cath in 01/2020)  . Borderline diabetes   . Coronary artery disease 01/2020   Mild, nonobstructive dz by cath  . Hyperlipidemia   . Hypertension   . Second degree AV block     Patient Active Problem List   Diagnosis Date Noted  . Heart block 05/05/2020  . CAD in native artery 05/05/2020  . AV block, 2nd degree 12/24/2019  . Bradycardia 11/08/2019  . Constipation 11/08/2019  . Elevated troponin 11/08/2019  . Diabetes mellitus type 2, uncomplicated (HCC) 11/08/2019  . Hypertension 11/08/2019  . Microhematuria 08/04/2019  . BPH without obstruction/lower urinary tract symptoms 08/04/2019  . Atypical chest pain 05/20/2018  . Shortness of breath 05/20/2018  . Moderate aortic stenosis 05/20/2018  . Nonrheumatic mitral valve regurgitation  05/20/2018  . Essential hypertension 05/20/2018  . Hyperlipidemia 05/20/2018    Past Surgical History:  Procedure Laterality Date  . APPENDECTOMY    . HERNIA REPAIR    . RIGHT/LEFT HEART CATH AND CORONARY ANGIOGRAPHY N/A 02/03/2020   Procedure: RIGHT/LEFT HEART CATH AND CORONARY ANGIOGRAPHY;  Surgeon: Yvonne Kendall, MD;  Location: [facility]VAS[facility] CV LAB;  Service: Cardiovascular;  Laterality: N/A;  . TONSILLECTOMY      Prior to Admission medications   Medication Sig Start Date End Date Taking? Authorizing Provider  aspirin EC 81 MG tablet Take 81 mg by mouth daily. Swallow whole.   Yes [provider]  atorvastatin (LIPITOR) 20 MG tablet Take 20 mg by mouth daily.   Yes [provider]  Cholecalciferol (D3-1000 PO) Take by mouth.   Yes [provider]  citalopram (CELEXA) 20 MG tablet Take 20 mg by mouth daily. 08/18/19  Yes [provider]  hydrochlorothiazide (HYDRODIURIL) 12.5 MG tablet Take 1 tablet (12.5 mg total) by mouth daily. 12/23/19  Yes End, Cristal Deer, MD  memantine (NAMENDA) 5 MG tablet Take 5 mg by mouth 2 (two) times daily. 06/15/20  Yes [provider]  potassium chloride SA (KLOR-CON) 20 MEQ tablet Take 20 mEq by mouth daily. 10/17/19  Yes [provider]  temazepam (RESTORIL) 30 MG capsule Take 30 mg by mouth at bedtime.   Yes [provider]  vitamin B-12 (CYANOCOBALAMIN) 1000 MCG tablet Take 1,000 mcg by mouth daily.   Yes [provider]  Allergies Patient has no known allergies.  Family History  Problem Relation Age of Onset  . Congestive Heart Failure Mother   . Heart attack Mother 58  . Cirrhosis Father   . COPD Sister     Social History Social History   Tobacco Use  . Smoking status: Never Smoker  . Smokeless tobacco: Never Used  Vaping Use  . Vaping Use: Never used  Substance Use Topics  . Alcohol use: Yes    Alcohol/week: 2.0 standard drinks    Types: 1 Cans of beer, 1  Shots of liquor per week    Comment: last drink was Saturday, 04/22/2020.  . Drug use: No    Review of Systems Constitutional: No fever/chills Eyes: No visual changes. ENT: No sore throat. Cardiovascular: Denies chest pain. Respiratory: Denies shortness of breath. Gastrointestinal: No abdominal pain.   Genitourinary: Negative for dysuria. Musculoskeletal: Negative for back pain. Skin: Negative for rash. Neurological: Negative for headaches, areas of focal weakness or numbness.    ____________________________________________   PHYSICAL EXAM:  VITAL SIGNS: ED Triage Vitals  Enc Vitals Group     BP 07/11/20 1008 (!) 147/64     Pulse Rate 07/11/20 1007 (!) 37     Resp 07/11/20 1007 16     Temp --      Temp Source 07/11/20 1007 Oral     SpO2 07/11/20 1007 95 %     Weight 07/11/20 1005 192 lb 14.4 oz (87.5 kg)     Height 07/11/20 1005 5\' 7"  (1.702 m)     Head Circumference --      Peak Flow --      Pain Score 07/11/20 1005 0     Pain Loc --      Pain Edu? --      Excl. in GC? --     Constitutional: Alert and oriented. Well appearing and in no acute distress. Eyes: Conjunctivae are normal. Head: Atraumatic. Nose: No congestion/rhinnorhea. Mouth/Throat: Mucous membranes are moist. Neck: No stridor.  Cardiovascular: Bradycardic rate, regular rhythm. Grossly normal heart sounds.  Good peripheral circulation. Respiratory: Normal respiratory effort.  No retractions. Lungs CTAB. Gastrointestinal: Soft and nontender. No distention. Musculoskeletal: No lower extremity tenderness nor edema. Neurologic:  Normal speech and language. No gross focal neurologic deficits are appreciated.  He has slight disorientation to year and date.  Daughter at bedside reports he does have dementia acting normally for his baseline. Skin:  Skin is warm, dry and intact. No rash noted. Psychiatric: Mood and affect are normal. Speech and behavior are  normal.  ____________________________________________   LABS (all labs ordered are listed, but only abnormal results are displayed)  Labs Reviewed  BASIC METABOLIC PANEL - Abnormal; Notable for the following components:      Result Value   Glucose, Bld 153 (*)    BUN 30 (*)    Creatinine, Ser 1.64 (*)    GFR, Estimated 41 (*)    All other components within normal limits  CBC - Abnormal; Notable for the following components:   WBC 11.2 (*)    Hemoglobin 17.3 (*)    All other components within normal limits  TROPONIN I (HIGH SENSITIVITY) - Abnormal; Notable for the following components:   Troponin I (High Sensitivity) 89 (*)    All other components within normal limits  RESP PANEL BY RT-PCR (FLU A&B, COVID) [id number]   ____________________________________________  EKG  EKG is reviewed inter by me at 1010, discussed and personally reviewed as well with  Dr. Cristal Deer and Heart rate 35 QRS 149 QTc 480 Complete heart block, left bundle branch block.  No obvious ischemic abnormalities.  Concerning for heart block ____________________________________________  RADIOLOGY  DG Chest Port 1 View  Result Date: 07/11/2020 CLINICAL DATA:  Acute bradycardia, lethargy, second-degree AV block EXAM: PORTABLE CHEST 1 VIEW COMPARISON:  04/27/2020 FINDINGS: Defibrillator pads overlie the chest. Heart is enlarged with vascular congestion centrally. Basilar atelectasis evident. No acute CHF, focal pneumonia, significant collapse or consolidation. No large effusion or pneumothorax. Trachea midline. Aorta atherosclerotic. IMPRESSION: Cardiomegaly with vascular congestion. Basilar atelectasis Aortic Atherosclerosis (ICD10-I70.0). Electronically Signed   By: Judie Petit.  Shick M.D.   On: 07/11/2020 11:07    Chest x-ray reviewed cardiomegaly vascular congestion ____________________________________________   PROCEDURES  Procedure(s) performed: None  Procedures  Critical Care performed: Yes, see critical  care note(s)  CRITICAL CARE Performed by: Sharyn Creamer   Total critical care time: 25 minutes  Critical care time was exclusive of separately billable procedures and treating other patients.  Critical care was necessary to treat or prevent imminent or life-threatening deterioration.  Critical care was time spent personally by me on the following activities: development of treatment plan with patient and/or surrogate as well as nursing, discussions with consultants, evaluation of patient's response to treatment, examination of patient, obtaining history from patient or surrogate, ordering and performing treatments and interventions, ordering and review of laboratory studies, ordering and review of radiographic studies, pulse oximetry and re-evaluation of patient's condition.  ____________________________________________   INITIAL IMPRESSION / ASSESSMENT AND PLAN / ED COURSE  Pertinent labs & imaging results that were available during my care of the patient were reviewed by me and considered in my medical decision making (see chart for details).   Case and consult placed immediately with Dr. Susy Frizzle for concerns of complete heart block.  He advised he is speaking with Dr. Graciela Husbands the patient's electrophysiologist, will see and provide consultation.  At this point it appears the patient likely has symptomatic complete heart block, but also in discussion with the patient and his daughter they advise goals of care to be quite important and they have considered pacemaker insertion for him previous, but are unsure based on his goals of care and history of dementia if that is something he would wish for.     ----------------------------------------- 11:47 AM on 07/11/2020 -----------------------------------------  Consult performed by Dr. Cristal Deer and, considering patient's goals of care, he recommends monitoring in the stepdown setting for this evening and a plan to have pacemaker inserted  tomorrow morning.  Discussed case care and admission with hospitalist Dr. Enedina Finner.  Dr. Okey Dupre advises against heparin. ____________________________________________   FINAL CLINICAL IMPRESSION(S) / ED DIAGNOSES  Final diagnoses:  Heart block AV third degree [facility])        Note:  This document was prepared using Dragon voice recognition software and may include unintentional dictation errors       Sharyn Creamer, MD 07/11/20 1148    Sharyn Creamer, MD 07/11/20 1148

## 2020-07-11 NOTE — ED Notes (Signed)
Spoke with charge nurse who advises that we do not do CHG baths in the ER.

## 2020-07-11 NOTE — ED Notes (Signed)
Patient complaining of pain 2/10 with shortness of breath.  Spoke with Dr. Arville Care who advises to give Tylenol that is ordered.  If that does not help with the pain, he will put an order for Morphine in.  Dr. Arville Care is aware of the vitals.    Vitals are stable, other than HR.

## 2020-07-11 NOTE — Telephone Encounter (Signed)
STAT if HR is under 50 or over 120 (normal HR is 60-100 beats per minute)  1) What is your heart rate? Low 30's now baseline 50's   2) Do you have a log of your heart rate readings (document readings)?    3) Do you have any other symptoms? Lethargy tiredness yawning o2 sats low 90's RA   Placed on hold for triage.

## 2020-07-11 NOTE — ED Triage Notes (Signed)
HR noted to be low this morning during home PT.  Patient states "Im just not feeling right this morning."  AAOx3.  Skin warm and dry. NAD

## 2020-07-11 NOTE — Telephone Encounter (Signed)
I agree with ED referral.  Ongoing management per Dr. Graciela Husbands, as 2:1 AV block and bradycardia have been longstanding issues followed by Dr. Graciela Husbands.  Yvonne Kendall, MD [facility]

## 2020-07-11 NOTE — ED Notes (Signed)
Pt continues to have to urinate then only dribbles out small amounts of urine. Dr. Allena Katz notified and new orders placed.

## 2020-07-11 NOTE — Telephone Encounter (Signed)
The patient is currently admitted with tentative plans for a PPM with Dr. Lalla Brothers tomorrow at [facility].

## 2020-07-11 NOTE — ED Notes (Signed)
Called dietary about dinner

## 2020-07-11 NOTE — ED Notes (Signed)
Daughter remains at bedside.  Patient verbalizes to this RN "I'm feeling so much better."  VSS.

## 2020-07-11 NOTE — ED Notes (Signed)
Assisted pt to side of bed to use urinal

## 2020-07-11 NOTE — ED Notes (Signed)
Dr. End at bedside. 

## 2020-07-11 NOTE — Consult Note (Addendum)
Cardiology Consultation:   Patient ID: Timothy Ramirez MRN: [id number]; DOB: 03-Aug-1933  Admit date: 07/11/2020 Date of Consult: 07/11/2020  PCP:  Jerl Mina, MD   [facility] Providers Cardiologist:  Yvonne Kendall, MD      Patient Profile:   Timothy Ramirez is a 87 y.o. 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 Fanny Bien.  History of Present Illness:   [name] was in his usual state of health over the last few weeks, though his family noticed that his heart rates would remain between 40 and 60 bpm.  He had been advised by physical therapy to drink more water in an effort to improve his heart rates.  He has chronic exertional dyspnea and fatigue, which has been stable.  This morning home health physical therapy came to work with Mr. Bertran and found him to be lethargic and hypoxic with a low heart rate.  His family brought him to the emergency department, where he was found to be in third-degree heart block.  Mr. Ybarbo reports feeling close to his baseline, noting only shortness of breath with minimal activity.  He denies chest pain, palpitations, lightheadedness, and edema.  He has previously been evaluated by Dr. Graciela Husbands (EP) multiple times out of concern for symptomatic bradycardia with intermittent 2:1 AV block.  The patient and his family have been reluctant to proceed with pacemaker placement and recently felt like discontinuation of Exelon may help his heart rate.  Prior work-up for other causes of fatigue and exertional dyspnea including evaluation of his aortic stenosis as well as pulmonary function testing, did not show a clear cause for his symptoms.   Past Medical History:  Diagnosis Date  . Aortic stenosis    Moderate (mean gradient 37, AVA 1.1 by cath in 01/2020)  . Borderline diabetes   . Coronary  artery disease 01/2020   Mild, nonobstructive dz by cath  . Hyperlipidemia   . Hypertension   . Second degree AV block     Past Surgical History:  Procedure Laterality Date  . APPENDECTOMY    . HERNIA REPAIR    . RIGHT/LEFT HEART CATH AND CORONARY ANGIOGRAPHY N/A 02/03/2020   Procedure: RIGHT/LEFT HEART CATH AND CORONARY ANGIOGRAPHY;  Surgeon: Yvonne Kendall, MD;  Location: [facility];  Service: Cardiovascular;  Laterality: N/A;  . TONSILLECTOMY       Home Medications:  Prior to Admission medications   Medication Sig Start Date Tovia Kisner Date Taking? Authorizing Provider  aspirin EC 81 MG tablet Take 81 mg by mouth daily. Swallow whole.   Yes [provider]  atorvastatin (LIPITOR) 20 MG tablet Take 20 mg by mouth daily.   Yes [provider]  Cholecalciferol (D3-1000 PO) Take by mouth.   Yes [provider]  citalopram (CELEXA) 20 MG tablet Take 20 mg by mouth daily. 08/18/19  Yes [provider]  hydrochlorothiazide (HYDRODIURIL) 12.5 MG tablet Take 1 tablet (12.5 mg total) by mouth daily. 12/23/19  Yes Damisha Wolff, Cristal Deer, MD  memantine (NAMENDA) 5 MG tablet Take 5 mg by mouth 2 (two) times daily. 06/15/20  Yes [provider]  potassium chloride SA (KLOR-CON) 20 MEQ tablet Take 20 mEq by mouth daily. 10/17/19  Yes [provider]  temazepam (RESTORIL) 30 MG capsule Take 30 mg by mouth at bedtime.   Yes [provider]  vitamin B-12 (CYANOCOBALAMIN) 1000 MCG tablet Take 1,000 mcg by mouth daily.   Yes [provider]    Inpatient Medications: Scheduled Meds: . atorvastatin  20 mg Oral Daily  . citalopram  20 mg Oral Daily  . memantine  5 mg Oral BID  . potassium chloride SA  20 mEq Oral Daily  . tamsulosin  0.4 mg Oral Daily  . temazepam  30 mg Oral QHS  . vitamin B-12  1,000 mcg Oral Daily   Continuous Infusions: . sodium chloride 75 mL/hr at 07/11/20 1414   PRN Meds: acetaminophen **OR**  acetaminophen, bisacodyl, ondansetron **OR** ondansetron (ZOFRAN) IV, senna-docusate  Allergies:   No Known Allergies  Social History:   Social History   Tobacco Use  . Smoking status: Never Smoker  . Smokeless tobacco: Never Used  Vaping Use  . Vaping Use: Never used  Substance Use Topics  . Alcohol use: Yes    Alcohol/week: 2.0 standard drinks    Types: 1 Cans of beer, 1 Shots of liquor per week    Comment: last drink was Saturday, 04/22/2020.  . Drug use: No     Family History:   Family History  Problem Relation Age of Onset  . Congestive Heart Failure Mother   . Heart attack Mother 989  . Cirrhosis Father   . COPD Sister      ROS:  Please see the history of present illness. All other ROS reviewed and negative.     Physical Exam/Data:   Vitals:   07/11/20 1430 07/11/20 1445 07/11/20 1645 07/11/20 1700  BP: (!) 127/58 116/81 (!) 127/57   Pulse: (!) 34 (!) 34 (!) 28   Resp: 15 14 18    Temp:      TempSrc:      SpO2: 92% 90% 91% 95%  Weight:      Height:       No intake or output data in the 24 hours ending 07/11/20 1747 Last 3 Weights 07/11/2020 06/29/2020 05/25/2020  Weight (lbs) 192 lb 14.4 oz 193 lb 195 lb  Weight (kg) 87.5 kg 87.544 kg 88.451 kg     Body mass index is 30.21 kg/m.  General: Elderly man lying comfortably on gurney.  His daughter is at the bedside. HEENT: normal Lymph: no adenopathy Neck: no JVD Endocrine:  No thryomegaly Vascular: No carotid bruits; FA pulses 2+ bilaterally without bruits  Cardiac: Bradycardic but regular with 2/6 systolic murmur. Lungs: Mildly diminished breath sounds throughout without wheezes or crackles. Abd: soft, nontender, no hepatomegaly  Ext: no edema Musculoskeletal:  No deformities, BUE and BLE strength normal and equal Skin: warm and dry  Neuro:  CNs 2-12 intact, no focal abnormalities noted Psych:  Normal affect   EKG:  The EKG was personally reviewed and demonstrates: Normal sinus rhythm with third-degree AV  block and junctional escape with underlying LBBB. Telemetry:  Telemetry was personally reviewed and demonstrates: Complete heart block with junctional escape.  Ventricular rate 35-40 bpm  Relevant CV Studies: L/RHC (02/03/2020): 1. Mild, non-obstructive coronary artery disease with 20-30% proximal LAD, mid LAD, and mid LCx stenoses. 2. Moderate, approaching severe, aortic valve stenosis (mean gradient 37 mmHg, valve area 1.1 cm^2). 3. Normal left and right heart filling pressures. 4. Normal cardiac output.  TTE (11/08/2019): 1. Left ventricular ejection fraction, by estimation, is 60 to 65%. The  left ventricle has normal function. The left ventricle has no regional  wall motion abnormalities. There is moderate left ventricular hypertrophy.  Left ventricular diastolic  parameters are consistent with Grade I diastolic dysfunction (impaired  relaxation).  2. Right ventricular systolic function is normal. The right ventricular  size is normal. Tricuspid regurgitation signal is inadequate for assessing  PA pressure.  3. Left atrial size was mildly dilated.  4. The mitral valve is normal in structure. Mild to moderate mitral valve  regurgitation. No evidence of mitral stenosis. Moderate mitral annular  calcification.  5. The aortic valve is abnormal. Aortic valve regurgitation is trivial.  Moderate aortic valve stenosis. Aortic valve area, by VTI measures 1.36  cm. Aortic valve mean gradient measures 28.3 mmHg.  6. Agitated saline contrast bubble study was negative, with no evidence  of any interatrial shunt.   Laboratory Data:  High Sensitivity Troponin:   Recent Labs  Lab 07/11/20 1015  TROPONINIHS 89*     Chemistry Recent Labs  Lab 07/11/20 1015  NA 137  K 4.1  CL 104  CO2 22  GLUCOSE 153*  BUN 30*  CREATININE 1.64*  CALCIUM 9.0  GFRNONAA 41*  ANIONGAP 11    No results for input(s): PROT, ALBUMIN, AST, ALT, ALKPHOS, BILITOT in the last 168  hours. Hematology Recent Labs  Lab 07/11/20 1015  WBC 11.2*  RBC 5.62  HGB 17.3*  HCT 49.7  MCV 88.4  MCH 30.8  MCHC 34.8  RDW 12.7  PLT 156    Radiology/Studies:  DG Chest Port 1 View  Result Date: 07/11/2020 CLINICAL DATA:  Acute bradycardia, lethargy, second-degree AV block EXAM: PORTABLE CHEST 1 VIEW COMPARISON:  04/27/2020 FINDINGS: Defibrillator pads overlie the chest. Heart is enlarged with vascular congestion centrally. Basilar atelectasis evident. No acute CHF, focal pneumonia, significant collapse or consolidation. No large effusion or pneumothorax. Trachea midline. Aorta atherosclerotic. IMPRESSION: Cardiomegaly with vascular congestion. Basilar atelectasis Aortic Atherosclerosis (ICD10-I70.0). Electronically Signed   By: Judie Petit.  Shick M.D.   On: 07/11/2020 11:07    Assessment and Plan:   Complete heart block and symptomatic bradycardia: Intermittent 2:1 AV block has been noted on multiple prior occasions with some vague symptoms consisting primarily of fatigue and exertional dyspnea.  Unfortunately, Mr. Hausmann conduction disease has progressed to the point of complete heart block.  I suspect that his lethargy and fatigue are related to bradycardia.  He appears near his baseline at this time and is normotensive.  I have spoken with Drs. Graciela Husbands and Lalla Brothers, and we all agree that pacemaker placement is indicated.  I have discussed the procedure at length with Mr. Gregson and his daughter, and they have agreed to proceed.  We will plan for dual-chamber pacemaker implantation tomorrow at [facility] with Dr. Lalla Brothers.  Plan for PPM tomorrow with Dr. Lalla Brothers at [facility].  Patient should be n.p.o. after midnight.  Patient should be monitored in stepdown/ICU with pacing pads in place.  AV nodal blocking agents should be avoided.  No indication for temporary transvenous pacing at this time.  Acute kidney injury: I suspect this may be due to dehydration or decreased cardiac output in the  setting of bradycardia.  Continue gentle hydration.  Avoid nephrotoxic drugs.  Repeat BMP in the morning.  Aortic stenosis: Moderate by catheterization and echocardiogram last year.  Continue clinical follow-up.  Anticipate repeat echo around 10/2020.  Leukocytosis: Mildly elevated WBC noted.  Chest radiograph without evidence of infectious process.  Question hemoconcentration and possible stress reaction in the setting of complete heart block.  Continue gentle hydration.  Repeat CBC in the morning.  For questions or updates, please  contact [facility] Please consult [email or web address] for contact info under [facility].  Signed, Yvonne Kendall, MD  07/11/2020 5:47 PM

## 2020-07-11 NOTE — Telephone Encounter (Signed)
Timothy Ramirez PT with [facility] called stating patient is lethargic, low heart rate, and low oxygen saturation. Advised to call 911 or have him go to local emergency room for further evaluation.

## 2020-07-11 NOTE — H&P (Signed)
[facility] [place] at [facility]   PATIENT NAME: Timothy Ramirez    MR#:  [id number]  DATE OF BIRTH:  [birth year]  DATE OF ADMISSION:  07/11/2020  PRIMARY CARE PHYSICIAN: Jerl Mina, MD   REQUESTING/REFERRING PHYSICIAN: Dr Fanny Bien  Patient coming from : home   CHIEF COMPLAINT:  weakness not feeling well  HISTORY OF PRESENT ILLNESS:  Mr. Lacher is 87 year old gentleman with history of Mobitz II 1st heart block, aortic stenosis, dementia comes emergency room with generalized weakness overall not feeling well. Patient was found to have significant bradycardia. Heart rate anywhere from 30 to 40. He is being admitted for further evaluation management of complete heart block  ED course: patient's heart rate remains 3240. Blood pressure stable. Labs appear stable. He was seen by [facility] cardiology recommends inpatient admission pacemaker placement tomorrow.  PAST MEDICAL HISTORY:   Past Medical History:  Diagnosis Date  . Aortic stenosis    Moderate (mean gradient 37, AVA 1.1 by cath in 01/2020)  . Borderline diabetes   . Coronary artery disease 01/2020   Mild, nonobstructive dz by cath  . Hyperlipidemia   . Hypertension   . Second degree AV block     PAST SURGICAL HISTOIRY:   Past Surgical History:  Procedure Laterality Date  . APPENDECTOMY    . HERNIA REPAIR    . RIGHT/LEFT HEART CATH AND CORONARY ANGIOGRAPHY N/A 02/03/2020   Procedure: RIGHT/LEFT HEART CATH AND CORONARY ANGIOGRAPHY;  Surgeon: Yvonne Kendall, MD;  Location: [facility] CV LAB;  Service: Cardiovascular;  Laterality: N/A;  . TONSILLECTOMY      SOCIAL HISTORY:   Social History   Tobacco Use  . Smoking status: Never Smoker  . Smokeless tobacco: Never Used  Substance Use Topics  . Alcohol use: Yes    Alcohol/week: 2.0 standard drinks    Types: 1 Cans of beer, 1 Shots of liquor per week    Comment: last drink was Saturday, 04/22/2020.    FAMILY HISTORY:   Family History  Problem  Relation Age of Onset  . Congestive Heart Failure Mother   . Heart attack Mother 32  . Cirrhosis Father   . COPD Sister     DRUG ALLERGIES:  No Known Allergies  REVIEW OF SYSTEMS:  Review of Systems  Constitutional: Positive for malaise/fatigue. Negative for chills, fever and weight loss.  HENT: Negative for ear discharge, ear pain and nosebleeds.   Eyes: Negative for blurred vision, pain and discharge.  Respiratory: Negative for sputum production, shortness of breath, wheezing and stridor.   Cardiovascular: Negative for chest pain, palpitations, orthopnea and PND.  Gastrointestinal: Negative for abdominal pain, diarrhea, nausea and vomiting.  Genitourinary: Negative for frequency and urgency.  Musculoskeletal: Negative for back pain and joint pain.  Neurological: Positive for weakness. Negative for sensory change, speech change and focal weakness.  Psychiatric/Behavioral: Negative for depression and hallucinations. The patient is not nervous/anxious.      MEDICATIONS AT HOME:   Prior to Admission medications   Medication Sig Start Date End Date Taking? Authorizing Provider  aspirin EC 81 MG tablet Take 81 mg by mouth daily. Swallow whole.   Yes [provider]  atorvastatin (LIPITOR) 20 MG tablet Take 20 mg by mouth daily.   Yes [provider]  Cholecalciferol (D3-1000 PO) Take by mouth.   Yes [provider]  citalopram (CELEXA) 20 MG tablet Take 20 mg by mouth daily. 08/18/19  Yes [provider]  hydrochlorothiazide (HYDRODIURIL) 12.5 MG  tablet Take 1 tablet (12.5 mg total) by mouth daily. 12/23/19  Yes End, Cristal Deer, MD  memantine (NAMENDA) 5 MG tablet Take 5 mg by mouth 2 (two) times daily. 06/15/20  Yes [provider]  potassium chloride SA (KLOR-CON) 20 MEQ tablet Take 20 mEq by mouth daily. 10/17/19  Yes [provider]  temazepam (RESTORIL) 30 MG capsule Take 30 mg by mouth at bedtime.   Yes [provider]   vitamin B-12 (CYANOCOBALAMIN) 1000 MCG tablet Take 1,000 mcg by mouth daily.   Yes [provider]      VITAL SIGNS:  Blood pressure (!) 127/52, pulse (!) 37, temperature 97.7 F (36.5 C), temperature source Oral, resp. rate 19, height 5\' 7"  (1.702 m), weight 87.5 kg, SpO2 91 %.  PHYSICAL EXAMINATION:  GENERAL:  87 y.o.-year-old patient lying in the bed with no acute distress.  LUNGS: Normal breath sounds bilaterally, no wheezing, rales,rhonchi or crepitation. No use of accessory muscles of respiration.  CARDIOVASCULAR: S1, S2 normal. No murmurs, rubs, or gallops.bradycardia  ABDOMEN: Soft, nontender, nondistended. Bowel sounds present. No organomegaly or mass.  EXTREMITIES: No pedal edema, cyanosis, or clubbing.  NEUROLOGIC: Cranial nerves II through XII are intact. Muscle strength 5/5 in all extremities. Sensation intact. Gait not checked.  PSYCHIATRIC: The patient is alert   SKIN: No obvious rash, lesion, or ulcer.   LABORATORY PANEL:   CBC Recent Labs  Lab 07/11/20 1015  WBC 11.2*  HGB 17.3*  HCT 49.7  PLT 156   ------------------------------------------------------------------------------------------------------------------  Chemistries  Recent Labs  Lab 07/11/20 1015  NA 137  K 4.1  CL 104  CO2 22  GLUCOSE 153*  BUN 30*  CREATININE 1.64*  CALCIUM 9.0   ------------------------------------------------------------------------------------------------------------------  Cardiac Enzymes No results for input(s): TROPONINI in the last 168 hours. ------------------------------------------------------------------------------------------------------------------  RADIOLOGY:  DG Chest Port 1 View  Result Date: 07/11/2020 CLINICAL DATA:  Acute bradycardia, lethargy, second-degree AV block EXAM: PORTABLE CHEST 1 VIEW COMPARISON:  04/27/2020 FINDINGS: Defibrillator pads overlie the chest. Heart is enlarged with vascular congestion centrally. Basilar atelectasis  evident. No acute CHF, focal pneumonia, significant collapse or consolidation. No large effusion or pneumothorax. Trachea midline. Aorta atherosclerotic. IMPRESSION: Cardiomegaly with vascular congestion. Basilar atelectasis Aortic Atherosclerosis (ICD10-I70.0). Electronically Signed   By: 06/27/2020.  Shick M.D.   On: 07/11/2020 11:07    EKG:   Complete heart block  IMPRESSION AND PLAN:  Mr. Ruggieri is 87 year old gentleman with history of Mobitz II 1st heart block, aortic stenosis, dementia comes emergency room with generalized weakness overall not feeling well. Patient was found to have significant bradycardia. Heart rate anywhere from 30 to 40. He is being admitted for further evaluation management of complete heart block  Bradycardia/complete heart block -- admit to step down -- NPO after midnight -- avoid antiplatelet agent -- [facility] aware of patient. Pacemaker to be placed tomorrow.  Dementia -- [facility]  Hyperlipidemia -- continue atorvastatin   I will plan was discussed with patient's daughter Bonita Quin who was in the ER   Family Communication : daughter Lynnell Dike in the ER Consults : [facility] cardiology Code Status : DNR per daughter [facility] DVT prophylaxis : SCD Level of care: Stepdown  TOTAL TIME TAKING CARE OF THIS PATIENT: **45* minutes.    Lynnell Dike M.D  [facility]     CC: Primary care physician; Enedina Finner, MD

## 2020-07-12 ENCOUNTER — Other Ambulatory Visit: Payer: Self-pay

## 2020-07-12 ENCOUNTER — Inpatient Hospital Stay: Payer: Medicare Other

## 2020-07-12 ENCOUNTER — Encounter
Admission: EM | Disposition: A | Discharge status: Home Health | Payer: Self-pay | Source: Home / Self Care | Attending: Internal Medicine

## 2020-07-12 DIAGNOSIS — Z66 Do not resuscitate: Secondary | ICD-10-CM | POA: Diagnosis present

## 2020-07-12 DIAGNOSIS — I442 Atrioventricular block, complete: Secondary | ICD-10-CM | POA: Diagnosis not present

## 2020-07-12 DIAGNOSIS — I350 Nonrheumatic aortic (valve) stenosis: Secondary | ICD-10-CM | POA: Diagnosis not present

## 2020-07-12 DIAGNOSIS — R001 Bradycardia, unspecified: Secondary | ICD-10-CM | POA: Diagnosis not present

## 2020-07-12 DIAGNOSIS — F0390 Unspecified dementia without behavioral disturbance: Secondary | ICD-10-CM | POA: Diagnosis not present

## 2020-07-12 HISTORY — PX: PACEMAKER IMPLANT: EP1218

## 2020-07-12 LAB — BASIC METABOLIC PANEL
Anion gap: 12 (ref 5–15)
BUN: 34 mg/dL — ABNORMAL HIGH (ref 8–23)
CO2: 20 mmol/L — ABNORMAL LOW (ref 22–32)
Calcium: 8.4 mg/dL — ABNORMAL LOW (ref 8.9–10.3)
Chloride: 106 mmol/L (ref 98–111)
Creatinine, Ser: 1.54 mg/dL — ABNORMAL HIGH (ref 0.61–1.24)
GFR, Estimated: 44 mL/min — ABNORMAL LOW (ref >60)
Glucose, Bld: 132 mg/dL — ABNORMAL HIGH (ref 70–99)
Potassium: 3.8 mmol/L (ref 3.5–5.1)
Sodium: 138 mmol/L (ref 135–145)

## 2020-07-12 LAB — URINALYSIS, ROUTINE W REFLEX MICROSCOPIC
Bilirubin Urine: NEGATIVE
Glucose, UA: NEGATIVE mg/dL
Ketones, ur: 5 mg/dL — AB
Leukocytes,Ua: NEGATIVE
Nitrite: NEGATIVE
Protein, ur: NEGATIVE mg/dL
Specific Gravity, Urine: 1.023 (ref 1.005–1.030)
Squamous Epithelial / HPF: NONE SEEN (ref 0–5)
pH: 5 (ref 5.0–8.0)

## 2020-07-12 LAB — CBC WITH DIFFERENTIAL/PLATELET
Abs Immature Granulocytes: 0.05 10*3/uL (ref 0.00–0.07)
Basophils Absolute: 0 10*3/uL (ref 0.0–0.1)
Basophils Relative: 0 %
Eosinophils Absolute: 0.1 10*3/uL (ref 0.0–0.5)
Eosinophils Relative: 1 %
HCT: 48.1 % (ref 39.0–52.0)
Hemoglobin: 17 g/dL (ref 13.0–17.0)
Immature Granulocytes: 0 %
Lymphocytes Relative: 24 %
Lymphs Abs: 2.8 10*3/uL (ref 0.7–4.0)
MCH: 31.7 pg (ref 26.0–34.0)
MCHC: 35.3 g/dL (ref 30.0–36.0)
MCV: 89.6 fL (ref 80.0–100.0)
Monocytes Absolute: 0.9 10*3/uL (ref 0.1–1.0)
Monocytes Relative: 8 %
Neutro Abs: 7.6 10*3/uL (ref 1.7–7.7)
Neutrophils Relative %: 67 %
Platelets: 143 10*3/uL — ABNORMAL LOW (ref 150–400)
RBC: 5.37 MIL/uL (ref 4.22–5.81)
RDW: 12.9 % (ref 11.5–15.5)
WBC: 11.5 10*3/uL — ABNORMAL HIGH (ref 4.0–10.5)
nRBC: 0 % (ref 0.0–0.2)

## 2020-07-12 LAB — PROTIME-INR
INR: 1.2 (ref 0.8–1.2)
Prothrombin Time: 15.6 seconds — ABNORMAL HIGH (ref 11.4–15.2)

## 2020-07-12 SURGERY — PACEMAKER IMPLANT
Anesthesia: Moderate Sedation

## 2020-07-12 MED ORDER — ONDANSETRON HCL 4 MG/2ML IJ SOLN: 4.0000 mg | Freq: Four times a day (QID) | INTRAMUSCULAR | Status: DC | PRN

## 2020-07-12 MED ORDER — ACETAMINOPHEN 325 MG PO TABS: 325.0000 mg | ORAL_TABLET | ORAL | Status: DC | PRN

## 2020-07-12 MED ORDER — HEPARIN (PORCINE) IN NACL 1000-0.9 UT/500ML-% IV SOLN
INTRAVENOUS | Status: DC | PRN
  Administered 2020-07-12: 500 mL

## 2020-07-12 MED ORDER — LIDOCAINE HCL (PF) 1 % IJ SOLN
INTRAMUSCULAR | Status: AC
  Filled 2020-07-12: qty 30

## 2020-07-12 MED ORDER — LIDOCAINE HCL (PF) 1 % IJ SOLN
INTRAMUSCULAR | Status: DC | PRN
  Administered 2020-07-12: 40 mL

## 2020-07-12 SURGICAL SUPPLY — 19 items
CABLE SURG 12 DISP A/V CHANNEL (MISCELLANEOUS) IMPLANT
DEVICE DSSCT PLSMBLD 3.0S LGHT (MISCELLANEOUS) ×1 IMPLANT
INTRO PACEMKR SHEATH II 7FR (MISCELLANEOUS) ×4
INTRODUCER PACEMKR SHTH II 7FR (MISCELLANEOUS) ×2 IMPLANT
IPG PACE AZUR XT DR MRI W1DR01 (Pacemaker) ×1 IMPLANT
LEAD CAPSURE NOVUS 5076-52CM (Lead) ×2 IMPLANT
LEAD CAPSURE NOVUS 5076-58CM (Lead) ×2 IMPLANT
PACE AZURE XT DR MRI W1DR01 (Pacemaker) ×2 IMPLANT
PAD ELECT DEFIB RADIOL ZOLL (MISCELLANEOUS) ×2 IMPLANT
PLASMABLADE 3.0S W/LIGHT (MISCELLANEOUS) ×2
SLING ARM IMMOBILIZER XL (CAST SUPPLIES) ×2 IMPLANT
SUT ETHIBOND CT1 BRD #0 30IN (SUTURE) ×4 IMPLANT
SUT VIC AB 2-0 CT1 27 (SUTURE) ×1
SUT VIC AB 2-0 CT1 TAPERPNT 27 (SUTURE) ×1 IMPLANT
SUT VIC AB 3-0 CT1 27 (SUTURE) ×1
SUT VIC AB 3-0 CT1 TAPERPNT 27 (SUTURE) ×1 IMPLANT
SUT VIC AB 3-0 X1 27 (SUTURE) IMPLANT
SYR BULB IRRIG 60ML STRL (SYRINGE) ×2 IMPLANT
TRAY PACEMAKER INSERTION (PACKS) ×2 IMPLANT

## 2020-07-12 NOTE — ED Notes (Addendum)
Patient's daughter came out and said "something is going on, come quick."    RN entered room to find patient in a panic.  Cardiac monitor revealed what appears to be P waves only, with no QRS complexes, at 0638, for approximately 4-5 seconds.  Patient then went back into a 3rd degree block.    Spoke with Dr. Okey Dupre to make aware.  No orders.  He advises the patient needs to get the pacemaker.

## 2020-07-12 NOTE — Consult Note (Signed)
Electrophysiology Consultation:   Patient ID: Timothy Ramirez MRN: [id number]; DOB: 01-Aug-1933  Admit date: 07/11/2020 Date of Consult: 07/12/2020  PCP:  Jerl Mina, MD   [facility] Providers Cardiologist:  Yvonne Kendall, MD   {  Patient Profile/HPI:   Timothy Ramirez is a 87 y.o. 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 His conduction transiently improved after stopping [place].  He presented to the hospital yesterday with lethargy and dyspnea with minimal exertion and was found to be in complete heart block.   Past Medical History:  Diagnosis Date  . Aortic stenosis    Moderate (mean gradient 37, AVA 1.1 by cath in 01/2020)  . Borderline diabetes   . Coronary artery disease 01/2020   Mild, nonobstructive dz by cath  . Hyperlipidemia   . Hypertension   . Second degree AV block     Past Surgical History:  Procedure Laterality Date  . APPENDECTOMY    . HERNIA REPAIR    . RIGHT/LEFT HEART CATH AND CORONARY ANGIOGRAPHY N/A 02/03/2020   Procedure: RIGHT/LEFT HEART CATH AND CORONARY ANGIOGRAPHY;  Surgeon: Yvonne Kendall, MD;  Location: [facility] INV[facility];  Service: Cardiovascular;  Laterality: N/A;  . TONSILLECTOMY       Home Medications:  Prior to Admission medications   Medication Sig Start Date End Date Taking? Authorizing Provider  aspirin EC 81 MG tablet Take 81 mg by mouth daily. Swallow whole.   Yes [provider]  atorvastatin (LIPITOR) 20 MG tablet Take 20 mg by mouth daily.   Yes [provider]  Cholecalciferol (D3-1000 PO) Take by mouth.   Yes [provider]  citalopram (CELEXA) 20 MG tablet Take 20 mg by mouth daily. 08/18/19  Yes [provider]  hydrochlorothiazide (HYDRODIURIL) 12.5  MG tablet Take 1 tablet (12.5 mg total) by mouth daily. 12/23/19  Yes End, Cristal Deer, MD  memantine (NAMENDA) 5 MG tablet Take 5 mg by mouth 2 (two) times daily. 06/15/20  Yes [provider]  potassium chloride SA (KLOR-CON) 20 MEQ tablet Take 20 mEq by mouth daily. 10/17/19  Yes [provider]  temazepam (RESTORIL) 30 MG capsule Take 30 mg by mouth at bedtime.   Yes [provider]  vitamin B-12 (CYANOCOBALAMIN) 1000 MCG tablet Take 1,000 mcg by mouth daily.   Yes [provider]    Inpatient Medications: Scheduled Meds: . [MAR Hold] atorvastatin  20 mg Oral Daily  . chlorhexidine  60 mL Topical Once  . chlorhexidine  60 mL Topical Once  . [MAR Hold] citalopram  20 mg Oral Daily  . gentamicin irrigation  80 mg Irrigation On Call  . [MAR Hold] memantine  5 mg Oral BID  . [MAR Hold] potassium chloride SA  20 mEq Oral Daily  . [MAR Hold] tamsulosin  0.4 mg Oral Daily  . [MAR Hold] temazepam  30 mg Oral QHS  . [MAR Hold] vitamin B-12  1,000 mcg Oral Daily   Continuous Infusions: . sodium chloride 75 mL/hr at 07/11/20 1414  . sodium chloride    .  ceFAZolin (ANCEF) IV     PRN Meds: [MAR Hold] acetaminophen **OR** [MAR Hold] acetaminophen, [MAR Hold] bisacodyl, [MAR Hold] ondansetron **OR** [MAR Hold] ondansetron (ZOFRAN) IV, [  MAR Hold] senna-docusate  Allergies:   No Known Allergies  Social History:   Social History   Socioeconomic History  . Marital status: Widowed    Spouse name: Not on file  . Number of children: Not on file  . Years of education: Not on file  . Highest education level: Not on file  Occupational History  . Not on file  Tobacco Use  . Smoking status: Never Smoker  . Smokeless tobacco: Never Used  Vaping Use  . Vaping Use: Never used  Substance and Sexual Activity  . Alcohol use: Yes    Alcohol/week: 2.0 standard drinks    Types: 1 Cans of beer, 1 Shots of liquor per week    Comment: last drink was Saturday,  04/22/2020.  . Drug use: No  . Sexual activity: Not Currently  Other Topics Concern  . Not on file  Social History Narrative  . Not on file   Social Determinants of Health   Financial Resource Strain: Not on file  Food Insecurity: Not on file  Transportation Needs: Not on file  Physical Activity: Not on file  Stress: Not on file  Social Connections: Not on file  Intimate Partner Violence: Not on file    Family History:    Family History  Problem Relation Age of Onset  . Congestive Heart Failure Mother   . Heart attack Mother 45  . Cirrhosis Father   . COPD Sister      ROS:  Please see the history of present illness.   All other ROS reviewed and negative.     Physical Exam/Data:   Vitals:   07/12/20 0126 07/12/20 0400 07/12/20 0500 07/12/20 0600  BP: 128/81 (!) 118/48 (!) 138/119 (!) 121/59  Pulse: (!) 50 (!) 34 (!) 34 (!) 32  Resp: 18 19 (!) 23 15  Temp:      TempSrc:      SpO2: 93% 90% 91% 94%  Weight:      Height:        Intake/Output Summary (Last 24 hours) at 07/12/2020 0727 Last data filed at 07/12/2020 0527 Gross per 24 hour  Intake --  Output 150 ml  Net -150 ml   Last 3 Weights 07/11/2020 06/29/2020 05/25/2020  Weight (lbs) 192 lb 14.4 oz 193 lb 195 lb  Weight (kg) 87.5 kg 87.544 kg 88.451 kg     Body mass index is 30.21 kg/m.  General:  Well nourished, well developed, in no acute distress HEENT: normal Lymph: no adenopathy Neck: no JVD Endocrine:  No thryomegaly Vascular: No carotid bruits; FA pulses 2+ bilaterally without bruits  Cardiac:  Bradycardic. II/VI systolic murmur Lungs:  clear to auscultation bilaterally, no wheezing, rhonchi or rales  Abd: soft, nontender, no hepatomegaly  Ext: no edema Musculoskeletal:  No deformities, BUE and BLE strength normal and equal Skin: warm and dry  Neuro:  CNs 2-12 intact, no focal abnormalities noted Psych:  Normal affect   EKG:  The EKG was personally reviewed and demonstrates:  Complete heart  block with a LBBB escape rhythm in 30s.     04/27/2020 ECG personally reviewed RBBB, sinus, 1:1, LAFB    Telemetry:  Telemetry was personally reviewed and demonstrates:  Complete heart block  Relevant CV Studies:   11/07/2020 Echo personally reviewed EF 60% Moderate LVH RV normal Mild LA dilation Mod MR    Laboratory Data:  High Sensitivity Troponin:   Recent Labs  Lab 07/11/20 1015  TROPONINIHS 89*  Chemistry Recent Labs  Lab 07/11/20 1015 07/12/20 0627  NA 137 138  K 4.1 3.8  CL 104 106  CO2 22 20*  GLUCOSE 153* 132*  BUN 30* 34*  CREATININE 1.64* 1.54*  CALCIUM 9.0 8.4*  GFRNONAA 41* 44*  ANIONGAP 11 12    No results for input(s): PROT, ALBUMIN, AST, ALT, ALKPHOS, BILITOT in the last 168 hours. Hematology Recent Labs  Lab 07/11/20 1015 07/12/20 0627  WBC 11.2* 11.5*  RBC 5.62 5.37  HGB 17.3* 17.0  HCT 49.7 48.1  MCV 88.4 89.6  MCH 30.8 31.7  MCHC 34.8 35.3  RDW 12.7 12.9  PLT 156 143*   BNPNo results for input(s): BNP, PROBNP in the last 168 hours.  DDimer No results for input(s): DDIMER in the last 168 hours.   Radiology/Studies:  DG Chest Port 1 View  Result Date: 07/11/2020 CLINICAL DATA:  Acute bradycardia, lethargy, second-degree AV block EXAM: PORTABLE CHEST 1 VIEW COMPARISON:  04/27/2020 FINDINGS: Defibrillator pads overlie the chest. Heart is enlarged with vascular congestion centrally. Basilar atelectasis evident. No acute CHF, focal pneumonia, significant collapse or consolidation. No large effusion or pneumothorax. Trachea midline. Aorta atherosclerotic. IMPRESSION: Cardiomegaly with vascular congestion. Basilar atelectasis Aortic Atherosclerosis (ICD10-I70.0). Electronically Signed   By: Judie Petit.  Shick M.D.   On: 07/11/2020 11:07     Assessment and Plan:   1. Complete heart block Progressively worsening AV conduction, now with symptomatic complete heart block. He will require a dual chamber permanent pacemaker. I have discussed  the procedure with the patient and his 2 adult children who are very involved in his care. I discussed the risks, recovery time in detail and all parties wish to proceed.  Risks, benefits, alternatives to PPM implantation were discussed in detail with the patient and family today. The patient and family understands that the risks include but are not limited to bleeding, infection, pneumothorax, perforation, tamponade, vascular damage, renal failure, MI, stroke, death, and lead dislodgement and wishes to proceed.    2. Moderate AS  3. Dementia Stable per family. Will try to minimize sedation today to avoid worsening mental status.   For questions or updates, please contact [facility] Please consult [email or web address] for contact info under    Signed, Lanier Prude, MD  07/12/2020 7:27 AM

## 2020-07-12 NOTE — ED Notes (Signed)
Timothy Ramirez from [place] called to get report.

## 2020-07-12 NOTE — TOC Progression Note (Signed)
Transition of Care [facility]) - Progression Note    Patient Details  Name: Timothy Ramirez MRN: [id number] Date of Birth: 09/24/1933  Transition [facility] Care [facility] CM/SW Contact  Basalt Cellar, RN Phone Number: 07/12/2020, 4:43 PM  Clinical Narrative:    Called into patients room and spoke to [name]-daughter updated on code 63.         Expected Discharge Plan and Services           Expected Discharge Date: 07/12/20                                     Social Determinants of Health (SDOH) Interventions    Readmission Risk Interventions No flowsheet data found.

## 2020-07-12 NOTE — Care Management Obs Status (Signed)
MEDICARE OBSERVATION STATUS NOTIFICATION   Patient Details  Name: Timothy Ramirez MRN: [id number] Date of Birth: 1933-12-20   Medicare Observation Status Notification Given:  Yes    [place] Cellar, RN 07/12/2020, 4:42 PM

## 2020-07-12 NOTE — ED Notes (Signed)
Patient is resting comfortably. 

## 2020-07-12 NOTE — Discharge Instructions (Signed)
After Your Pacemaker   . You have a [employer] Pacemaker  ACTIVITY . Do not lift your arm above shoulder height for 1 week after your procedure. After 7 days, you may progress as below.  . You should remove your sling 24 hours after your procedure, unless otherwise instructed by your provider.     Wednesday July 19, 2020  Thursday July 20, 2020 Friday July 21, 2020 Saturday July 22, 2020   . Do not lift, push, pull, or carry anything over 10 pounds with the affected arm until 6 weeks (Wednesday August 23, 2020 ) after your procedure.   . You may drive AFTER your wound check, unless you have been told otherwise by your provider. Your wound check visit is 07/25/2020 at 4:00PM.  . Ask your healthcare provider when you can go back to work   INCISION/Dressing . If you are on a blood thinner such as Coumadin, Xarelto, Eliquis, Plavix, or Pradaxa please confirm with your provider when this should be resumed.  . If large square, outer bandage is left in place, this can be removed after 24 hours from your procedure. Do not remove steri-strips or glue as below.   . Monitor your Pacemaker site for redness, swelling, and drainage. Call the device clinic at [phone] if you experience these symptoms or fever/chills.  . If your incision is sealed with Steri-strips or staples, you may shower 10 days after your procedure or when told by your provider. Do not remove the steri-strips or let the shower hit directly on your site. You may wash around your site with soap and water.    Marland Kitchen Avoid lotions, ointments, or perfumes over your incision until it is well-healed.  . You may use a hot tub or a pool AFTER your wound check appointment if the incision is completely closed.  Marland Kitchen PAcemaker Alerts:  Some alerts are vibratory and others beep. These are NOT emergencies. Please call our office to let us know. If this occurs at night or on weekends, it can wait until the next business day. Send a remote  transmission.  . If your device is capable of reading fluid status (for heart failure), you will be offered monthly monitoring to review this with you.   DEVICE MANAGEMENT . Remote monitoring is used to monitor your pacemaker from home. This monitoring is scheduled every 91 days by our office. It allows [place] to keep an eye on the functioning of your device to ensure it is working properly. You will routinely see your Electrophysiologist annually (more often if necessary).   . You should receive your ID card for your new device in 4-8 weeks. Keep this card with you at all times once received. Consider wearing a medical alert bracelet or necklace.  . Your Pacemaker may be MRI compatible. This will be discussed at your next office visit/wound check.  You should avoid contact with strong electric or magnetic fields.    Do not use amateur (ham) radio equipment or electric (arc) welding torches. MP3 player headphones with magnets should not be used. Some devices are safe to use if held at least 12 inches (30 cm) from your Pacemaker. These include power tools, lawn mowers, and speakers. If you are unsure if something is safe to use, ask your health care provider.   When using your cell phone, hold it to the ear that is on the opposite side from the Pacemaker. Do not leave your cell phone in a pocket over the Pacemaker.  You may safely use electric blankets, heating pads, computers, and microwave ovens.  Call the office right away if:  You have chest pain.  You feel more short of breath than you have felt before.  You feel more light-headed than you have felt before.  Your incision starts to open up.  This information is not intended to replace advice given to you by your health care provider. Make sure you discuss any questions you have with your health care provider.

## 2020-07-12 NOTE — Care Management CC44 (Signed)
Condition Code 44 Documentation Completed  Patient Details  Name: Timothy Ramirez MRN: [id number] Date of Birth: [birth year]   Condition Code 44 given:  Yes Patient signature on Condition Code 44 notice:  Yes Documentation of 2 MD's agreement:  Yes Code 44 added to claim:  Yes    Irena Cellar, RN 07/12/2020, 4:43 PM

## 2020-07-12 NOTE — Discharge Summary (Signed)
Physician Discharge Summary  Timothy Ramirez GYK:[id number] DOB: 1933-11-28 DOA: 07/11/2020  PCP: Jerl Mina, MD  Admit date: 07/11/2020 Discharge date: 07/12/2020  Admitted From: home Disposition:  home  Recommendations for Outpatient Follow-up:  1. Follow up with PCP in 1-2 weeks 2. Please obtain BMP/CBC in one week 3. Follow up appointment with cardiology has been scheduled 4. Patient/family have received instructions on post op wound care for PPM site 5. 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 He was noted to be in complete heart block with HR in the 30-40s. Remainder of labs relatively unrevealing, no evidence of underlying infection. He was seen by cardiology and underwent PPM placement on 5/25. Procedure did not have any immediate complications and patient reported symptomatic improvement after procedure. He was evaluated by EP and felt appropriate for discharge on 5/25. He was seen by physical therapy who recommended HHPT. He will follow up with cardiology in the next several weeks and will have wound check in 1 week. Patient and family have received instructions on keeping wound clean and limitations of movement of left arm. His other medical issues remained stable. His HCTZ was discontinued due to risk of developing dehydration. His blood pressure has remained stable. This can be further addressed by PCP  Discharge Diagnoses:  Active Problems:   Moderate aortic stenosis   Heart block AV third degree (HCC)   Dementia without behavioral disturbance (HCC)   Complete heart block (HCC)   DNR (do not resuscitate)    Discharge  Instructions  Discharge Instructions    DME Bedside commode   Complete by: As directed    Patient needs a bedside commode to treat with the following condition: Generalized weakness   Diet - low sodium heart healthy   Complete by: As directed    Face-to-face encounter (required for Medicare/Medicaid patients)   Complete by: As directed    I Erick Blinks certify that this patient is under my care and that I, or a nurse practitioner or physician's assistant working with me, had a face-to-face encounter that meets the physician face-to-face encounter requirements with this patient on 07/12/2020. The encounter with the patient was in whole, or in part for the following medical condition(s) which is the primary reason for home health care (List medical condition): generalized weakness   The encounter with the patient was in whole, or in part, for the following medical condition, which is the primary reason for home health care: generalized weakness   I certify that, based on my findings, the following services are medically necessary home health services:  Nursing Physical therapy     Reason for Medically Necessary Home Health Services: Therapy- Therapeutic Exercises to Increase Strength and Endurance   My clinical findings support the need for the above services: Cognitive impairments, dementia, or mental confusion  that make it unsafe to leave home   Further, I certify that my clinical findings support that this patient is homebound due to: Mental confusion   Home Health   Complete by: As directed    To provide the following care/treatments:  PT RN     Increase activity slowly   Complete by: As directed  Allergies as of 07/12/2020   No Known Allergies     Medication List    STOP taking these medications   hydrochlorothiazide 12.5 MG tablet Commonly known as: HYDRODIURIL   potassium chloride SA 20 MEQ tablet Commonly known as: KLOR-CON     TAKE these medications   aspirin EC  81 MG tablet Take 81 mg by mouth daily. Swallow whole.   atorvastatin 20 MG tablet Commonly known as: LIPITOR Take 20 mg by mouth daily.   citalopram 20 MG tablet Commonly known as: CELEXA Take 20 mg by mouth daily.   D3-1000 PO Take by mouth.   memantine 5 MG tablet Commonly known as: NAMENDA Take 5 mg by mouth 2 (two) times daily.   temazepam 30 MG capsule Commonly known as: RESTORIL Take 30 mg by mouth at bedtime.   vitamin B-12 1000 MCG tablet Commonly known as: CYANOCOBALAMIN Take 1,000 mcg by mouth daily.            Durable Medical Equipment  (From admission, onward)         Start     Ordered   07/12/20 1602  For home use only DME 3 n 1  Once        07/12/20 1601   07/12/20 0000  DME Bedside commode       Question:  Patient needs a bedside commode to treat with the following condition  Answer:  Generalized weakness   07/12/20 1559          Follow-up Information    [facility] Follow up on 07/25/2020.   Specialty: Cardiology Why: Wound Check appointment 07/25/2020 at 4:00PM Contact information: [address], [address] [place] [address] [phone]       Yvonne Kendall, MD Follow up on 08/10/2020.   Specialty: Cardiology Why: Dr. Okey Dupre visit 6/23 at 11:40AM Contact information: [address] [place] [address] [phone]              No Known Allergies  Consultations:  Cardiology  electrophysiology   Procedures/Studies: DG Chest 2 View  Result Date: 07/12/2020 CLINICAL DATA:  Pacemaker placement. EXAM: CHEST - 2 VIEW COMPARISON:  Jul 11, 2020. FINDINGS: The heart size and mediastinal contours are within normal limits. Both lungs are clear. Interval placement of left-sided pacemaker with leads in grossly good position. No pneumothorax or pleural effusion is noted. The visualized skeletal structures are unremarkable. IMPRESSION: Interval placement of left-sided pacemaker.  Electronically Signed   By: Lupita Raider M.D.   On: 07/12/2020 13:55   DG Chest Port 1 View  Result Date: 07/11/2020 CLINICAL DATA:  Acute bradycardia, lethargy, second-degree AV block EXAM: PORTABLE CHEST 1 VIEW COMPARISON:  04/27/2020 FINDINGS: Defibrillator pads overlie the chest. Heart is enlarged with vascular congestion centrally. Basilar atelectasis evident. No acute CHF, focal pneumonia, significant collapse or consolidation. No large effusion or pneumothorax. Trachea midline. Aorta atherosclerotic. IMPRESSION: Cardiomegaly with vascular congestion. Basilar atelectasis Aortic Atherosclerosis (ICD10-I70.0). Electronically Signed   By: Judie Petit.  Shick M.D.   On: 07/11/2020 11:07       Subjective: Patient is feeling better after pacemaker placement. No chest pain or shortness of breath  Discharge Exam: Vitals:   07/12/20 0945 07/12/20 1000 07/12/20 1036 07/12/20 1201  BP: 116/81 130/70 (!) 154/81 130/78  Pulse: 100 97 (!) 108 87  Resp: 20 (!) Temp:   97.8 F (36.6 C) 97.7 F (36.5 C)  TempSrc:  Oral  SpO2: 93% 92% 96% 97%  Weight:   88 kg   Height:   5\' 7"  (1.702 m)     General: Pt is alert, awake, not in acute distress Cardiovascular: RRR, S1/S2 +, no rubs, no gallops Respiratory: CTA bilaterally, no wheezing, no rhonchi Abdominal: Soft, NT, ND, bowel sounds + Extremities: no edema, no cyanosis    The results of significant diagnostics from this hospitalization (including imaging, microbiology, ancillary and laboratory) are listed below for reference.     Microbiology: Recent Results (from the past 240 hour(s))  Resp Panel by RT-PCR (Flu A&B, Covid) Nasopharyngeal Swab     Status: None   Collection Time: 07/11/20 12:47 PM   Specimen: Nasopharyngeal Swab; Nasopharyngeal(NP) swabs in vial transport medium  Result Value Ref Range Status   SARS Coronavirus 2 by RT PCR NEGATIVE NEGATIVE Final    Comment: (NOTE) SARS-CoV-2 target nucleic acids are NOT  DETECTED.  The SARS-CoV-2 RNA is generally detectable in upper respiratory specimens during the acute phase of infection. The lowest concentration of SARS-CoV-2 viral copies this assay can detect is 138 copies/mL. A negative result does not preclude SARS-Cov-2 infection and should not be used as the sole basis for treatment or other patient management decisions. A negative result may occur with  improper specimen collection/handling, submission of specimen other than nasopharyngeal swab, presence of viral mutation(s) within the areas targeted by this assay, and inadequate number of viral copies(<138 copies/mL). A negative result must be combined with clinical observations, patient history, and epidemiological information. The expected result is Negative.  Fact Sheet for Patients:  BloggerCourse.comhttps://www.fda.gov/media/152166/download  Fact Sheet for Healthcare Providers:  SeriousBroker.ithttps://www.fda.gov/media/152162/download  This test is no t yet approved or cleared by the [place] FDA and  has been authorized for detection and/or diagnosis of SARS-CoV-2 by FDA under an Emergency Use Authorization (EUA). This EUA will remain  in effect (meaning this test can be used) for the duration of the COVID-19 declaration under Section 564(b)(1) of the Act, 21 U.S.C.section 360bbb-3(b)(1), unless the authorization is terminated  or revoked sooner.       Influenza A by PCR NEGATIVE NEGATIVE Final   Influenza B by PCR NEGATIVE NEGATIVE Final    Comment: (NOTE) The Xpert Xpress SARS-CoV-2/FLU/RSV plus assay is intended as an aid in the diagnosis of influenza from Nasopharyngeal swab specimens and should not be used as a sole basis for treatment. Nasal washings and aspirates are unacceptable for Xpert Xpress SARS-CoV-2/FLU/RSV testing.  Fact Sheet for Patients: BloggerCourse.comhttps://www.fda.gov/media/152166/download  Fact Sheet for Healthcare Providers: SeriousBroker.ithttps://www.fda.gov/media/152162/download  This test is not yet  approved or cleared by the [place] FDA and has been authorized for detection and/or diagnosis of SARS-CoV-2 by FDA under an Emergency Use Authorization (EUA). This EUA will remain in effect (meaning this test can be used) for the duration of the COVID-19 declaration under Section 564(b)(1) of the Act, 21 U.S.C. section 360bbb-3(b)(1), unless the authorization is terminated or revoked.  Performed at [facility], [address] [place], [place] [id number]      Labs: BNP (last 3 results) No results for input(s): BNP in the last 8760 hours. Basic Metabolic Panel: Recent Labs  Lab 07/11/20 1015 07/12/20 0627  NA 137 138  K 4.1 3.8  CL 104 106  CO2 22 20*  GLUCOSE 153* 132*  BUN 30* 34*  CREATININE 1.64* 1.54*  CALCIUM 9.0 8.4*   Liver Function Tests: No results for input(s): AST, ALT, ALKPHOS, BILITOT, PROT, ALBUMIN in the  last 168 hours. No results for input(s): LIPASE, AMYLASE in the last 168 hours. No results for input(s): AMMONIA in the last 168 hours. CBC: Recent Labs  Lab 07/11/20 1015 07/12/20 0627  WBC 11.2* 11.5*  NEUTROABS  --  7.6  HGB 17.3* 17.0  HCT 49.7 48.1  MCV 88.4 89.6  PLT 156 143*   Cardiac Enzymes: No results for input(s): CKTOTAL, CKMB, CKMBINDEX, TROPONINI in the last 168 hours. BNP: Invalid input(s): POCBNP CBG: No results for input(s): GLUCAP in the last 168 hours. D-Dimer No results for input(s): DDIMER in the last 72 hours. Hgb A1c No results for input(s): HGBA1C in the last 72 hours. Lipid Profile No results for input(s): CHOL, HDL, LDLCALC, TRIG, CHOLHDL, LDLDIRECT in the last 72 hours. Thyroid function studies No results for input(s): TSH, T4TOTAL, T3FREE, THYROIDAB in the last 72 hours.  Invalid input(s): FREET3 Anemia work up No results for input(s): VITAMINB12, FOLATE, FERRITIN, TIBC, IRON, RETICCTPCT in the last 72 hours. Urinalysis    Component Value Date/Time   COLORURINE YELLOW (A) 07/12/2020 1035    APPEARANCEUR CLEAR (A) 07/12/2020 1035   APPEARANCEUR Hazy (A) 09/08/2019 0948   LABSPEC 1.023 07/12/2020 1035   PHURINE 5.0 07/12/2020 1035   GLUCOSEU NEGATIVE 07/12/2020 1035   HGBUR SMALL (A) 07/12/2020 1035   BILIRUBINUR NEGATIVE 07/12/2020 1035   BILIRUBINUR Negative 09/08/2019 0948   KETONESUR 5 (A) 07/12/2020 1035   PROTEINUR NEGATIVE 07/12/2020 1035   NITRITE NEGATIVE 07/12/2020 1035   LEUKOCYTESUR NEGATIVE 07/12/2020 1035   Sepsis Labs Invalid input(s): PROCALCITONIN,  WBC,  LACTICIDVEN Microbiology Recent Results (from the past 240 hour(s))  Resp Panel by RT-PCR (Flu A&B, Covid) Nasopharyngeal Swab     Status: None   Collection Time: 07/11/20 12:47 PM   Specimen: Nasopharyngeal Swab; Nasopharyngeal(NP) swabs in vial transport medium  Result Value Ref Range Status   SARS Coronavirus 2 by RT PCR NEGATIVE NEGATIVE Final    Comment: (NOTE) SARS-CoV-2 target nucleic acids are NOT DETECTED.  The SARS-CoV-2 RNA is generally detectable in upper respiratory specimens during the acute phase of infection. The lowest concentration of SARS-CoV-2 viral copies this assay can detect is 138 copies/mL. A negative result does not preclude SARS-Cov-2 infection and should not be used as the sole basis for treatment or other patient management decisions. A negative result may occur with  improper specimen collection/handling, submission of specimen other than nasopharyngeal swab, presence of viral mutation(s) within the areas targeted by this assay, and inadequate number of viral copies(<138 copies/mL). A negative result must be combined with clinical observations, patient history, and epidemiological information. The expected result is Negative.  Fact Sheet for Patients:  BloggerCourse.com  Fact Sheet for Healthcare Providers:  SeriousBroker.it  This test is no t yet approved or cleared by the [place] FDA and  has been  authorized for detection and/or diagnosis of SARS-CoV-2 by FDA under an Emergency Use Authorization (EUA). This EUA will remain  in effect (meaning this test can be used) for the duration of the COVID-19 declaration under Section 564(b)(1) of the Act, 21 U.S.C.section 360bbb-3(b)(1), unless the authorization is terminated  or revoked sooner.       Influenza A by PCR NEGATIVE NEGATIVE Final   Influenza B by PCR NEGATIVE NEGATIVE Final    Comment: (NOTE) The Xpert Xpress SARS-CoV-2/FLU/RSV plus assay is intended as an aid in the diagnosis of influenza from Nasopharyngeal swab specimens and should not be used as a sole basis for treatment. Nasal washings and  aspirates are unacceptable for Xpert Xpress SARS-CoV-2/FLU/RSV testing.  Fact Sheet for Patients: BloggerCourse.com  Fact Sheet for Healthcare Providers: SeriousBroker.it  This test is not yet approved or cleared by the [place] FDA and has been authorized for detection and/or diagnosis of SARS-CoV-2 by FDA under an Emergency Use Authorization (EUA). This EUA will remain in effect (meaning this test can be used) for the duration of the COVID-19 declaration under Section 564(b)(1) of the Act, 21 U.S.C. section 360bbb-3(b)(1), unless the authorization is terminated or revoked.  Performed at [facility], [address], [place], [place] [address]      Time coordinating discharge:  SIGNED:   Erick Blinks, MD  [facility] 07/12/2020, 4:04 PM   If 7PM-7AM, please contact night-coverage [email or web address]

## 2020-07-12 NOTE — Evaluation (Signed)
Physical Therapy Evaluation Patient Details Name: Timothy Ramirez MRN: [id number] DOB: 1933-10-19 Today's Date: 07/12/2020   History of Present Illness  Pt is an 87 year old gentleman with history of Mobitz II 1st heart block, aortic stenosis, dementia comes emergency room with generalized weakness overall not feeling well. Patient was found to have significant bradycardia.  Pt now s/p pacemaker placement.    Clinical Impression  Pt was pleasant but required frequent cuing to prevent LUE use during functional tasks.  Pt did not require physical assistance with bed mobility or transfers but even with sling donned to his LUE the pt would frequently attempt to use that arm to assist with tasks.  Pt was somewhat unsteady during ambulation and required occasional min A for stability.  Pt reported no adverse symptoms during the session with SpO2 and HR WNL.  Pt will benefit from HHPT upon discharge to safely address deficits listed in patient problem list for decreased caregiver assistance and eventual return to PLOF.      Follow Up Recommendations Home health PT;Supervision/Assistance - 24 hour;Supervision for mobility/OOB    Equipment Recommendations  3in1 (PT)    Recommendations for Other Services       Precautions / Restrictions Precautions Precautions: Fall Required Braces or Orthoses: Sling Restrictions Other Position/Activity Restrictions: LUE in sling s/p pacemaker placement      Mobility  Bed Mobility Overal bed mobility: Needs Assistance Bed Mobility: Sit to Supine;Supine to Sit     Supine to sit: Supervision Sit to supine: Supervision   General bed mobility comments: Cues for sequencing to prevent use of LUE    Transfers Overall transfer level: Needs assistance Equipment used: 1 person hand held assist Transfers: Sit to/from Stand Sit to Stand: Min guard         General transfer comment: Mod verbal cueing for sequencing  Ambulation/Gait Ambulation/Gait  assistance: Min assist;Min guard Gait Distance (Feet): 40 Feet Assistive device: 1 person hand held assist Gait Pattern/deviations: Step-through pattern;Decreased step length - right;Decreased step length - left Gait velocity: WNL   General Gait Details: Min A on two occasions for stability  Stairs            Wheelchair Mobility    Modified Rankin (Stroke Patients Only)       Balance Overall balance assessment: Needs assistance   Sitting balance-Leahy Scale: Good     Standing balance support: During functional activity;Single extremity supported Standing balance-Leahy Scale: Poor Standing balance comment: Min A for stability                             Pertinent Vitals/Pain Pain Assessment: No/denies pain    Home Living Family/patient expects to be discharged to:: Private residence Living Arrangements: Other relatives;Children Available Help at Discharge: Friend(s);Available 24 hours/day Type of Home: House Home Access: Ramped entrance     Home Layout: One level Home Equipment: Cane - single point Additional Comments: Pt's daughter assisted with history secondary to pt with dementia    Prior Function Level of Independence: Needs assistance   Gait / Transfers Assistance Needed: Ind amb without AD, 2 falls in the last 6 months associated with EtOH use, "That's been fixed" according to daughter  ADL's / Homemaking Assistance Needed: Assist PRN with ADLs        Hand Dominance        Extremity/Trunk Assessment   Upper Extremity Assessment Upper Extremity Assessment: LUE deficits/detail LUE: Unable to fully assess due  to immobilization    Lower Extremity Assessment Lower Extremity Assessment: Generalized weakness       Communication      Cognition Arousal/Alertness: Awake/alert Behavior During Therapy: WFL for tasks assessed/performed Overall Cognitive Status: History of cognitive impairments - at baseline                                         General Comments      Exercises     Assessment/Plan    PT Assessment Patient needs continued PT services  PT Problem List Decreased strength;Decreased activity tolerance;Decreased balance;Decreased mobility;Decreased knowledge of use of DME;Decreased knowledge of precautions       PT Treatment Interventions DME instruction;Gait training;Functional mobility training;Therapeutic activities;Therapeutic exercise;Balance training;Patient/family education    PT Goals (Current goals can be found in the Care Plan section)  Acute Rehab PT Goals Patient Stated Goal: To return home PT Goal Formulation: With patient Time For Goal Achievement: 07/25/20 Potential to Achieve Goals: Good    Frequency Min 2X/week   Barriers to discharge        Co-evaluation               AM-PAC PT "6 Clicks" Mobility  Outcome Measure Help needed turning from your back to your side while in a flat bed without using bedrails?: A Little Help needed moving from lying on your back to sitting on the side of a flat bed without using bedrails?: A Little Help needed moving to and from a bed to a chair (including a wheelchair)?: A Little Help needed standing up from a chair using your arms (e.g., wheelchair or bedside chair)?: A Little Help needed to walk in hospital room?: A Little Help needed climbing 3-5 steps with a railing? : A Little 6 Click Score: 18    End of Session Equipment Utilized During Treatment: Gait belt Activity Tolerance: Patient tolerated treatment well Patient left: in bed;with call bell/phone within reach;with bed alarm set;with family/visitor present Nurse Communication: Mobility status PT Visit Diagnosis: Unsteadiness on feet (R26.81);History of falling (Z91.81);Muscle weakness (generalized) (M62.81);Difficulty in walking, not elsewhere classified (R26.2)    Time: 1345-1405 PT Time Calculation (min) (ACUTE ONLY): 20 min   Charges:   PT  Evaluation $PT Eval Moderate Complexity: 1 Mod          D. Scott Tytionna Cloyd PT, DPT 07/12/20, 3:41 PM

## 2020-07-13 ENCOUNTER — Encounter: Payer: Self-pay | Admitting: Cardiology

## 2020-07-25 ENCOUNTER — Ambulatory Visit (INDEPENDENT_AMBULATORY_CARE_PROVIDER_SITE_OTHER): Payer: Medicare Other | Admitting: Emergency Medicine

## 2020-07-25 ENCOUNTER — Other Ambulatory Visit: Payer: Self-pay

## 2020-07-25 DIAGNOSIS — I442 Atrioventricular block, complete: Secondary | ICD-10-CM | POA: Diagnosis not present

## 2020-07-25 LAB — CUP PACEART INCLINIC DEVICE CHECK
Battery Remaining Longevity: 138 mo
Battery Voltage: 3.2 V
Brady Statistic AP VP Percent: 32.07 %
Brady Statistic AP VS Percent: 0 %
Brady Statistic AS VP Percent: 67.53 %
Brady Statistic AS VS Percent: 0.4 %
Brady Statistic RA Percent Paced: 32.02 %
Brady Statistic RV Percent Paced: 99.6 %
Date Time Interrogation Session: 20220607162038
Implantable Lead Implant Date: 20220525
Implantable Lead Implant Date: 20220525
Implantable Lead Location: 753859
Implantable Lead Location: 753860
Implantable Lead Model: 5076
Implantable Lead Model: 5076
Implantable Pulse Generator Implant Date: 20220525
Lead Channel Impedance Value: 380 Ohm
Lead Channel Impedance Value: 456 Ohm
Lead Channel Impedance Value: 475 Ohm
Lead Channel Impedance Value: 627 Ohm
Lead Channel Pacing Threshold Amplitude: 0.75 V
Lead Channel Pacing Threshold Amplitude: 0.75 V
Lead Channel Pacing Threshold Pulse Width: 0.4 ms
Lead Channel Pacing Threshold Pulse Width: 0.4 ms
Lead Channel Sensing Intrinsic Amplitude: 1.375 mV
Lead Channel Setting Pacing Amplitude: 3.5 V
Lead Channel Setting Pacing Amplitude: 3.5 V
Lead Channel Setting Pacing Pulse Width: 0.4 ms
Lead Channel Setting Sensing Sensitivity: 1.2 mV

## 2020-07-25 NOTE — Progress Notes (Signed)
Wound check appointment. Steri-strips removed. Wound without redness or edema. Incision edges approximated, wound well healed. Normal device function. Thresholds, P wave sensing, and impedances consistent with implant measurements. Device programmed at 3.5V for extra safety margin until 3 month visit. Histogram distribution appropriate for patient and level of activity. No mode switches or high ventricular rates noted. Patient educated about wound care, arm mobility, lifting restrictions. Message sent to patient to contact pt for 91 day follow-up with Dr. Lalla Brothers.  Patient is enrolled in remote monitoring, next scheduled check 10/12/20.

## 2020-08-09 ENCOUNTER — Ambulatory Visit (INDEPENDENT_AMBULATORY_CARE_PROVIDER_SITE_OTHER): Payer: Medicare Other | Admitting: Internal Medicine

## 2020-08-09 ENCOUNTER — Other Ambulatory Visit: Payer: Self-pay

## 2020-08-09 ENCOUNTER — Encounter: Payer: Self-pay | Admitting: Internal Medicine

## 2020-08-09 VITALS — BP 150/80 | HR 62 | Ht 69.0 in | Wt 194.0 lb

## 2020-08-09 DIAGNOSIS — I442 Atrioventricular block, complete: Secondary | ICD-10-CM | POA: Diagnosis not present

## 2020-08-09 DIAGNOSIS — I10 Essential (primary) hypertension: Secondary | ICD-10-CM

## 2020-08-09 DIAGNOSIS — I350 Nonrheumatic aortic (valve) stenosis: Secondary | ICD-10-CM

## 2020-08-09 DIAGNOSIS — E782 Mixed hyperlipidemia: Secondary | ICD-10-CM | POA: Diagnosis not present

## 2020-08-09 NOTE — Patient Instructions (Signed)
Medication Instructions:  Your physician recommends that you continue on your current medications as directed. Please refer to the Current Medication list given to you today.  *If you need a refill on your cardiac medications before your next appointment, please call your pharmacy*   Lab Work:  None ordered  Testing/Procedures:  Your physician has requested that you have an echocardiogram early September 2022. Echocardiography is a painless test that uses sound waves to create images of your heart. It provides your doctor with information about the size and shape of your heart and how well your heart's chambers and valves are working. This procedure takes approximately one hour. There are no restrictions for this procedure.    Follow-Up: At [facility], you and your health needs are our priority.  As part of our continuing mission to provide you with exceptional heart care, we have created designated Provider Care Teams.  These Care Teams include your primary Cardiologist (physician) and Advanced Practice Providers (APPs -  Physician Assistants and Nurse Practitioners) who all work together to provide you with the care you need, when you need it.  We recommend signing up for the patient portal called "MyChart".  Sign up information is provided on this After Visit Summary.  MyChart is used to connect with patients for Virtual Visits (Telemedicine).  Patients are able to view lab/test results, encounter notes, upcoming appointments, etc.  Non-urgent messages can be sent to your provider as well.   To learn more about what you can do with MyChart, go to [email or web address].    Your next appointment:    Follow up with Dr. Okey Dupre either same day or shortly after Echo  The format for your next appointment:   In Person  Provider:   You may see Yvonne Kendall, MD

## 2020-08-09 NOTE — Progress Notes (Signed)
Follow-up Outpatient Visit Date: 08/09/2020  Primary Care Provider: Jerl Mina, MD [address] [facility] [place] [address]  Chief Complaint: Follow-up aortic stenosis and heart block  HPI:  Timothy Ramirez is a 87 y.o. male with history of moderate aortic stenosis, complete heart block status post permanent pacemaker, hypertension, and hyperlipidemia, who presents for follow-up of heart block, dyspnea, and fatigue.  I last saw him in the office in March, at which time Timothy Ramirez reported feeling fairly well though he continued to have exertional dyspnea with mild activity.  I referred him back to Dr. Graciela Husbands to consider pacemaker placement given his history of intermittent 2:1 AV block.  Ultimately, pacemaker placement was deferred.  However, Timothy Ramirez subsequently presented to [facility] in late May with lethargy, hypoxia, and marked bradycardia.  He was found to be in complete heart block.  He underwent successful dual-chamber pacemaker placement by Dr. Lalla Brothers on 07/12/2020.  Today, Timothy Ramirez reports that he is feeling better than at the time of his hospitalization last month when he presented with complete heart block.  However, his energy has not improved significantly from his baseline over the last 6 to 12 months.  He has "good days and bad days.".  He denies chest pain, shortness of breath, palpitations, lightheadedness, and edema, complaining only that he does not have much energy to do activities.  He reports being compliant with his medications  --------------------------------------------------------------------------------------------------  Past Medical History:  Diagnosis Date   Aortic stenosis    Moderate (mean gradient 37, AVA 1.1 by cath in 01/2020)   Borderline diabetes    Complete heart block (HCC) 06/2020   s/p Medtronic PPM 07/12/2020   Coronary artery disease 01/2020   Mild, nonobstructive dz by cath   Hyperlipidemia    Hypertension    Past Surgical  History:  Procedure Laterality Date   APPENDECTOMY     HERNIA REPAIR     PACEMAKER IMPLANT N/A 07/12/2020   Procedure: PACEMAKER IMPLANT;  Surgeon: Lanier Prude, MD;  Location: [facility] INVASIVE CV LAB;  Service: Cardiovascular;  Laterality: N/A;   RIGHT/LEFT HEART CATH AND CORONARY ANGIOGRAPHY N/A 02/03/2020   Procedure: RIGHT/LEFT HEART CATH AND CORONARY ANGIOGRAPHY;  Surgeon: Yvonne Kendall, MD;  Location: [facility] INVASIVE CV LAB;  Service: Cardiovascular;  Laterality: N/A;   TONSILLECTOMY      Current Meds  Medication Sig   aspirin EC 81 MG tablet Take 81 mg by mouth daily. Swallow whole.   atorvastatin (LIPITOR) 20 MG tablet Take 20 mg by mouth daily.   Cholecalciferol (D3-1000 PO) Take 1 tablet by mouth daily.   citalopram (CELEXA) 20 MG tablet Take 20 mg by mouth daily.   memantine (NAMENDA) 5 MG tablet Take 5 mg by mouth 2 (two) times daily.   temazepam (RESTORIL) 30 MG capsule Take 30 mg by mouth at bedtime.   vitamin B-12 (CYANOCOBALAMIN) 1000 MCG tablet Take 1,000 mcg by mouth daily.    Allergies: Patient has no known allergies.  Social History   Tobacco Use   Smoking status: Never   Smokeless tobacco: Never  Vaping Use   Vaping Use: Never used  Substance Use Topics   Alcohol use: Not Currently    Alcohol/week: 2.0 standard drinks    Types: 1 Cans of beer, 1 Shots of liquor per week    Comment: last drink was Saturday, 04/22/2020.   Drug use: No    Family History  Problem Relation Age of Onset   Congestive  Heart Failure Mother    Heart attack Mother 64   Cirrhosis Father    COPD Sister     Review of Systems: A 12-system review of systems was performed and was negative except as noted in the HPI.  --------------------------------------------------------------------------------------------------  Physical Exam: BP (!) 150/80 (BP Location: Left Arm, Patient Position: Sitting, Cuff Size: Large)   Pulse 62   Ht 5\' 9"  (1.753 m)   Wt 194 lb (88 kg)   SpO2 96%    BMI 28.65 kg/m   General:  NAD. Neck: No JVD or HJR. Lungs: Clear to auscultation bilaterally without wheezes or crackles. Heart: Regular rate and rhythm with 2/6 systolic murmur. Abdomen: Soft, nontender, nondistended. Extremities: No lower extremity edema. Skin: Left upper chest pacemaker insertion site is well-healed without erythema or tenderness.  EKG: Predominantly AV paced with occasional sinus rhythm with ventricular pacing.  Lab Results  Component Value Date   WBC 11.5 (H) 07/12/2020   HGB 17.0 07/12/2020   HCT 48.1 07/12/2020   MCV 89.6 07/12/2020   PLT 143 (L) 07/12/2020    Lab Results  Component Value Date   NA 138 07/12/2020   K 3.8 07/12/2020   CL 106 07/12/2020   CO2 20 (L) 07/12/2020   BUN 34 (H) 07/12/2020   CREATININE 1.54 (H) 07/12/2020   GLUCOSE 132 (H) 07/12/2020   ALT 19 12/23/2019   Outside lipid panel (06/14/2019): Total cholesterol 142, triglycerides 121, HDL 39, LDL 79  --------------------------------------------------------------------------------------------------  ASSESSMENT AND PLAN: Aortic stenosis: Unfortunately, Timothy Ramirez continues to have waxing and waning fatigue that did not improve significantly with pacemaker placement.  Echocardiogram in 10/2019 showed normal LVEF with moderate AS.  Catheterization in 01/2020 showed moderate bordering on severe AS, though it was felt that his fatigue was out of proportion to the degree of stenosis.  We will plan to repeat an echo in early September to reassess the aortic valve.  If there has been any progression in the degree of aortic stenosis, we will refer him to the valve clinic in [place].  In the meantime, I have encouraged Timothy Ramirez to keep working with physical therapy to help improve his strength/stamina.  Complete heart block: Clinically, Timothy Ramirez is doing much better than when he was admitted last month with severe symptomatic bradycardia in the setting of complete heart block.  His  pacemaker insertion site is well-healed.  Timothy Ramirez should continue follow-up with the device clinic.  Hypertension: Blood pressure is mildly elevated today but is typically lower.  Given fatigue and age, we will defer medication changes today.  Hyperlipidemia: Most recent lipid panel a year ago by Dr. Orson Slick showed reasonable lipids with LDL of 79 and triglycerides of 121.  Given only mild CAD on catheterization in 01/2020, I think it is reasonable to continue atorvastatin 20 mg daily.  I will defer ongoing follow-up to Dr. 02/2020.  Follow-up: Return to clinic in 3 months.  Burnett Sheng, MD 08/10/2020 7:12 AM

## 2020-08-10 ENCOUNTER — Encounter: Payer: Self-pay | Admitting: Internal Medicine

## 2020-08-10 ENCOUNTER — Ambulatory Visit: Payer: Medicare Other | Admitting: Internal Medicine

## 2020-08-11 ENCOUNTER — Telehealth: Payer: Self-pay

## 2020-08-11 NOTE — Telephone Encounter (Signed)
Successful telephone encounter to [name] (on DPR), daughter of Timothy Ramirez. Hara, in response to MyChart question regarding lower rate limit. Discussed with daughter mechanism of DDD pacer and settings for lower and upper rates. Daughter states PT was enquiring if a heart rate of 64 was reportable post PPM. Daughter will relay message to PT. Appreciative of call. Patient scheduled to follow up in clinic 8/31 for 91 day post implant. Remote transmissions confirmed with next scheduled 10/12/20.

## 2020-09-09 ENCOUNTER — Other Ambulatory Visit: Payer: Self-pay

## 2020-09-09 ENCOUNTER — Emergency Department: Payer: Medicare Other

## 2020-09-09 ENCOUNTER — Encounter: Payer: Self-pay | Admitting: Emergency Medicine

## 2020-09-09 ENCOUNTER — Inpatient Hospital Stay
Admission: EM | Admit: 2020-09-09 | Discharge: 2020-09-15 | DRG: 064 | Disposition: A | Discharge status: Skilled Nursing Facility | Payer: Medicare Other | Attending: Internal Medicine | Admitting: Internal Medicine

## 2020-09-09 DIAGNOSIS — I10 Essential (primary) hypertension: Secondary | ICD-10-CM | POA: Diagnosis present

## 2020-09-09 DIAGNOSIS — I2510 Atherosclerotic heart disease of native coronary artery without angina pectoris: Secondary | ICD-10-CM | POA: Diagnosis not present

## 2020-09-09 DIAGNOSIS — R0602 Shortness of breath: Secondary | ICD-10-CM

## 2020-09-09 DIAGNOSIS — N179 Acute kidney failure, unspecified: Secondary | ICD-10-CM | POA: Diagnosis present

## 2020-09-09 DIAGNOSIS — R4701 Aphasia: Secondary | ICD-10-CM | POA: Diagnosis present

## 2020-09-09 DIAGNOSIS — I442 Atrioventricular block, complete: Secondary | ICD-10-CM | POA: Diagnosis present

## 2020-09-09 DIAGNOSIS — I63542 Cerebral infarction due to unspecified occlusion or stenosis of left cerebellar artery: Principal | ICD-10-CM | POA: Diagnosis present

## 2020-09-09 DIAGNOSIS — F0280 Dementia in other diseases classified elsewhere without behavioral disturbance: Secondary | ICD-10-CM | POA: Diagnosis present

## 2020-09-09 DIAGNOSIS — E1165 Type 2 diabetes mellitus with hyperglycemia: Secondary | ICD-10-CM | POA: Diagnosis present

## 2020-09-09 DIAGNOSIS — Z79899 Other long term (current) drug therapy: Secondary | ICD-10-CM

## 2020-09-09 DIAGNOSIS — D696 Thrombocytopenia, unspecified: Secondary | ICD-10-CM | POA: Diagnosis present

## 2020-09-09 DIAGNOSIS — Z20822 Contact with and (suspected) exposure to covid-19: Secondary | ICD-10-CM | POA: Diagnosis present

## 2020-09-09 DIAGNOSIS — Z23 Encounter for immunization: Secondary | ICD-10-CM

## 2020-09-09 DIAGNOSIS — I350 Nonrheumatic aortic (valve) stenosis: Secondary | ICD-10-CM

## 2020-09-09 DIAGNOSIS — Z7982 Long term (current) use of aspirin: Secondary | ICD-10-CM

## 2020-09-09 DIAGNOSIS — R778 Other specified abnormalities of plasma proteins: Secondary | ICD-10-CM | POA: Diagnosis present

## 2020-09-09 DIAGNOSIS — E785 Hyperlipidemia, unspecified: Secondary | ICD-10-CM | POA: Diagnosis present

## 2020-09-09 DIAGNOSIS — Z66 Do not resuscitate: Secondary | ICD-10-CM | POA: Diagnosis not present

## 2020-09-09 DIAGNOSIS — I248 Other forms of acute ischemic heart disease: Secondary | ICD-10-CM | POA: Diagnosis present

## 2020-09-09 DIAGNOSIS — I493 Ventricular premature depolarization: Secondary | ICD-10-CM | POA: Diagnosis present

## 2020-09-09 DIAGNOSIS — Z9049 Acquired absence of other specified parts of digestive tract: Secondary | ICD-10-CM

## 2020-09-09 DIAGNOSIS — R112 Nausea with vomiting, unspecified: Secondary | ICD-10-CM | POA: Diagnosis not present

## 2020-09-09 DIAGNOSIS — E119 Type 2 diabetes mellitus without complications: Secondary | ICD-10-CM

## 2020-09-09 DIAGNOSIS — I080 Rheumatic disorders of both mitral and aortic valves: Secondary | ICD-10-CM | POA: Diagnosis present

## 2020-09-09 DIAGNOSIS — E538 Deficiency of other specified B group vitamins: Secondary | ICD-10-CM | POA: Diagnosis present

## 2020-09-09 DIAGNOSIS — F32A Depression, unspecified: Secondary | ICD-10-CM | POA: Diagnosis present

## 2020-09-09 DIAGNOSIS — Z950 Presence of cardiac pacemaker: Secondary | ICD-10-CM

## 2020-09-09 DIAGNOSIS — R413 Other amnesia: Secondary | ICD-10-CM

## 2020-09-09 DIAGNOSIS — K449 Diaphragmatic hernia without obstruction or gangrene: Secondary | ICD-10-CM | POA: Diagnosis present

## 2020-09-09 DIAGNOSIS — Z8249 Family history of ischemic heart disease and other diseases of the circulatory system: Secondary | ICD-10-CM

## 2020-09-09 DIAGNOSIS — G309 Alzheimer's disease, unspecified: Secondary | ICD-10-CM | POA: Diagnosis present

## 2020-09-09 DIAGNOSIS — I6502 Occlusion and stenosis of left vertebral artery: Secondary | ICD-10-CM | POA: Diagnosis present

## 2020-09-09 DIAGNOSIS — R531 Weakness: Secondary | ICD-10-CM

## 2020-09-09 DIAGNOSIS — I639 Cerebral infarction, unspecified: Secondary | ICD-10-CM

## 2020-09-09 DIAGNOSIS — K529 Noninfective gastroenteritis and colitis, unspecified: Secondary | ICD-10-CM | POA: Diagnosis present

## 2020-09-09 DIAGNOSIS — I619 Nontraumatic intracerebral hemorrhage, unspecified: Secondary | ICD-10-CM | POA: Diagnosis present

## 2020-09-09 DIAGNOSIS — I160 Hypertensive urgency: Secondary | ICD-10-CM | POA: Diagnosis present

## 2020-09-09 DIAGNOSIS — R29701 NIHSS score 1: Secondary | ICD-10-CM | POA: Diagnosis present

## 2020-09-09 DIAGNOSIS — E860 Dehydration: Secondary | ICD-10-CM | POA: Diagnosis present

## 2020-09-09 LAB — CBC
HCT: 47.8 % (ref 39.0–52.0)
Hemoglobin: 17.1 g/dL — ABNORMAL HIGH (ref 13.0–17.0)
MCH: 31.4 pg (ref 26.0–34.0)
MCHC: 35.8 g/dL (ref 30.0–36.0)
MCV: 87.9 fL (ref 80.0–100.0)
Platelets: 142 10*3/uL — ABNORMAL LOW (ref 150–400)
RBC: 5.44 MIL/uL (ref 4.22–5.81)
RDW: 12.9 % (ref 11.5–15.5)
WBC: 11.6 10*3/uL — ABNORMAL HIGH (ref 4.0–10.5)
nRBC: 0 % (ref 0.0–0.2)

## 2020-09-09 LAB — COMPREHENSIVE METABOLIC PANEL
ALT: 22 U/L (ref 0–44)
AST: 24 U/L (ref 15–41)
Albumin: 3.8 g/dL (ref 3.5–5.0)
Alkaline Phosphatase: 89 U/L (ref 38–126)
Anion gap: 11 (ref 5–15)
BUN: 24 mg/dL — ABNORMAL HIGH (ref 8–23)
CO2: 20 mmol/L — ABNORMAL LOW (ref 22–32)
Calcium: 9.3 mg/dL (ref 8.9–10.3)
Chloride: 106 mmol/L (ref 98–111)
Creatinine, Ser: 1.3 mg/dL — ABNORMAL HIGH (ref 0.61–1.24)
GFR, Estimated: 54 mL/min — ABNORMAL LOW (ref >60)
Glucose, Bld: 184 mg/dL — ABNORMAL HIGH (ref 70–99)
Potassium: 4.1 mmol/L (ref 3.5–5.1)
Sodium: 137 mmol/L (ref 135–145)
Total Bilirubin: 2.1 mg/dL — ABNORMAL HIGH (ref 0.3–1.2)
Total Protein: 7.2 g/dL (ref 6.5–8.1)

## 2020-09-09 LAB — URINALYSIS, COMPLETE (UACMP) WITH MICROSCOPIC
Bacteria, UA: NONE SEEN
Bilirubin Urine: NEGATIVE
Glucose, UA: NEGATIVE mg/dL
Ketones, ur: 20 mg/dL — AB
Leukocytes,Ua: NEGATIVE
Nitrite: NEGATIVE
Protein, ur: 30 mg/dL — AB
Specific Gravity, Urine: 1.02 (ref 1.005–1.030)
pH: 7 (ref 5.0–8.0)

## 2020-09-09 LAB — D-DIMER, QUANTITATIVE: D-Dimer, Quant: 0.61 ug/mL-FEU — ABNORMAL HIGH (ref 0.00–0.50)

## 2020-09-09 LAB — TROPONIN I (HIGH SENSITIVITY)
Troponin I (High Sensitivity): 35 ng/L — ABNORMAL HIGH (ref <18)
Troponin I (High Sensitivity): 75 ng/L — ABNORMAL HIGH (ref <18)
Troponin I (High Sensitivity): 85 ng/L — ABNORMAL HIGH (ref <18)

## 2020-09-09 LAB — MAGNESIUM: Magnesium: 1.9 mg/dL (ref 1.7–2.4)

## 2020-09-09 LAB — RESP PANEL BY RT-PCR (FLU A&B, COVID) ARPGX2
Influenza A by PCR: NEGATIVE
Influenza B by PCR: NEGATIVE
SARS Coronavirus 2 by RT PCR: NEGATIVE

## 2020-09-09 LAB — BRAIN NATRIURETIC PEPTIDE: B Natriuretic Peptide: 209.4 pg/mL — ABNORMAL HIGH (ref 0.0–100.0)

## 2020-09-09 LAB — CK: Total CK: 55 U/L (ref 49–397)

## 2020-09-09 LAB — LIPASE, BLOOD: Lipase: 19 U/L (ref 11–51)

## 2020-09-09 LAB — TSH: TSH: 0.841 u[IU]/mL (ref 0.350–4.500)

## 2020-09-09 LAB — T4, FREE: Free T4: 1 ng/dL (ref 0.61–1.12)

## 2020-09-09 MED ORDER — CITALOPRAM HYDROBROMIDE 20 MG PO TABS
20.0000 mg | ORAL_TABLET | Freq: Every day | ORAL | Status: DC
  Administered 2020-09-10 – 2020-09-15 (×6): 20 mg via ORAL
  Filled 2020-09-09 (×6): qty 1

## 2020-09-09 MED ORDER — ONDANSETRON HCL 4 MG/2ML IJ SOLN
INTRAMUSCULAR | Status: AC
  Administered 2020-09-09: 4 mg via INTRAVENOUS
  Filled 2020-09-09: qty 2

## 2020-09-09 MED ORDER — IOHEXOL 350 MG/ML SOLN
100.0000 mL | Freq: Once | INTRAVENOUS | Status: AC | PRN
  Administered 2020-09-09: 100 mL via INTRAVENOUS

## 2020-09-09 MED ORDER — MEMANTINE HCL 5 MG PO TABS
5.0000 mg | ORAL_TABLET | Freq: Two times a day (BID) | ORAL | Status: DC
  Administered 2020-09-09 – 2020-09-15 (×12): 5 mg via ORAL
  Filled 2020-09-09 (×13): qty 1

## 2020-09-09 MED ORDER — HYDRALAZINE HCL 20 MG/ML IJ SOLN
10.0000 mg | Freq: Four times a day (QID) | INTRAMUSCULAR | Status: DC | PRN
  Administered 2020-09-10: 10 mg via INTRAVENOUS
  Filled 2020-09-09: qty 1

## 2020-09-09 MED ORDER — VITAMIN B-12 1000 MCG PO TABS: 1000.0000 ug | ORAL_TABLET | Freq: Every day | ORAL | Status: DC

## 2020-09-09 MED ORDER — VITAMIN B-12 1000 MCG PO TABS
1000.0000 ug | ORAL_TABLET | Freq: Every day | ORAL | Status: DC
  Administered 2020-09-10 – 2020-09-15 (×6): 1000 ug via ORAL
  Filled 2020-09-09 (×6): qty 1

## 2020-09-09 MED ORDER — PANTOPRAZOLE SODIUM 40 MG IV SOLR
40.0000 mg | Freq: Two times a day (BID) | INTRAVENOUS | Status: DC
  Administered 2020-09-09 – 2020-09-15 (×12): 40 mg via INTRAVENOUS
  Filled 2020-09-09 (×12): qty 40

## 2020-09-09 MED ORDER — SODIUM CHLORIDE 0.9 % IV SOLN: Freq: Once | INTRAVENOUS | Status: AC

## 2020-09-09 MED ORDER — ONDANSETRON HCL 4 MG/2ML IJ SOLN: 4.0000 mg | Freq: Once | INTRAMUSCULAR | Status: AC

## 2020-09-09 MED ORDER — ASPIRIN 81 MG PO CHEW
324.0000 mg | CHEWABLE_TABLET | Freq: Once | ORAL | Status: AC
  Administered 2020-09-09: 243 mg via ORAL
  Filled 2020-09-09: qty 4

## 2020-09-09 MED ORDER — HEPARIN SODIUM (PORCINE) 5000 UNIT/ML IJ SOLN
5000.0000 [IU] | Freq: Three times a day (TID) | INTRAMUSCULAR | Status: DC
  Administered 2020-09-09 – 2020-09-11 (×6): 5000 [IU] via SUBCUTANEOUS
  Filled 2020-09-09 (×6): qty 1

## 2020-09-09 MED ORDER — ASPIRIN EC 81 MG PO TBEC
81.0000 mg | DELAYED_RELEASE_TABLET | Freq: Every day | ORAL | Status: DC
  Administered 2020-09-10 – 2020-09-11 (×2): 81 mg via ORAL
  Filled 2020-09-09 (×2): qty 1

## 2020-09-09 MED ORDER — SODIUM CHLORIDE 0.9 % IV BOLUS
1000.0000 mL | Freq: Once | INTRAVENOUS | Status: AC
  Administered 2020-09-09: 1000 mL via INTRAVENOUS

## 2020-09-09 NOTE — H&P (Signed)
History and Physical    Timothy Ramirez DPO:[id number] DOB: 1933-11-10 DOA: 09/09/2020  PCP: Jerl Mina, MD    Patient coming from:  Home    Chief Complaint:  Generalized weakness /AMS / N/V  Primary cardiologist: Dr. Okey Dupre  HPI: Timothy Ramirez is a 87 y.o. male seen in ed with complaints of generalized weakness and ams along with N/V. Pt was doing well at his baseline today am and ? If he has eaten outside but started to have nausea and vomiting and brought to ed.   Pt has past medical history of aortic stenosis, borderline diabetes, complete heart block, heart disease, hyperlipidemia, hypertension.Pt drank heavily for a year and family did not know and until 4 months ago family found out he no longer drinks.   ED Course:  Vitals:   09/09/20 1708 09/09/20 1710 09/09/20 1730 09/09/20 1845  BP: (!) 175/86  (!) 148/81 (!) 165/77  Pulse: 68 62  (!) 36  Resp: (!) 22 12  18   Temp:      TempSrc:      SpO2: 92% 93%  93%  Weight:      Height:      In the emergency room patient is alert awake follows commands is cooperative O2 sats of 92% on room air. Blood work shows a CMP with a glucose of 184 creatinine of 1.30 with a GFR 54, initial troponin of 30 5 repeat troponin of 75, CBC is within normal limits.  Respiratory panel is negative for influenza and COVID, urinalysis is negative, CT head is negative for any acute intracranial abnormality, CT of the abdomen and pelvis is negative for any acute abnormalities, but it does show moderate hiatal hernia, diverticulosis, moderate colonic stool burden, no lymphadenopathy.  Patient's last echocardiogram was in 03-2018 report as below: Left ventricular cavity size is normal with an EF of 55%, mild to moderate increased left ventricular wall thickness grade 1 diastolic dysfunction, severe calcification of the aortic valve, moderate stenosis of the aortic valve.  In the emergency room patient received aspirin 324, normal saline bolus 1000 mL (for  nausea, ongoing maintenance IV fluid with normal saline at 150 mL/h.  I have ordered magnesium level, lipase, thyroid function test, CPK level.   Review of Systems:  Review of Systems  Unable to perform ROS: Dementia    Past Medical History:  Diagnosis Date   Aortic stenosis    Moderate (mean gradient 37, AVA 1.1 by cath in 01/2020)   Borderline diabetes    Complete heart block (HCC) 06/2020   s/p Medtronic PPM 07/12/2020   Coronary artery disease 01/2020   Mild, nonobstructive dz by cath   Hyperlipidemia    Hypertension     Past Surgical History:  Procedure Laterality Date   APPENDECTOMY     HERNIA REPAIR     PACEMAKER IMPLANT N/A 07/12/2020   Procedure: PACEMAKER IMPLANT;  Surgeon: 07/14/2020, MD;  Location: [facility] INVASIVE CV LAB;  Service: Cardiovascular;  Laterality: N/A;   RIGHT/LEFT HEART CATH AND CORONARY ANGIOGRAPHY N/A 02/03/2020   Procedure: RIGHT/LEFT HEART CATH AND CORONARY ANGIOGRAPHY;  Surgeon: 02/05/2020, MD;  Location: [facility] INVASIVE CV LAB;  Service: Cardiovascular;  Laterality: N/A;   TONSILLECTOMY       reports that he has never smoked. He has never used smokeless tobacco. He reports previous alcohol use of about 2.0 standard drinks of alcohol per week. He reports that he does not use drugs.  No Known Allergies  Family  History  Problem Relation Age of Onset   Congestive Heart Failure Mother    Heart attack Mother 4089   Cirrhosis Father    COPD Sister     Prior to Admission medications   Medication Sig Start Date End Date Taking? Authorizing Provider  aspirin EC 81 MG tablet Take 81 mg by mouth daily. Swallow whole.    [provider]  atorvastatin (LIPITOR) 20 MG tablet Take 20 mg by mouth daily.    [provider]  Cholecalciferol (D3-1000 PO) Take 1 tablet by mouth daily.    [provider]  citalopram (CELEXA) 20 MG tablet Take 20 mg by mouth daily. 08/18/19   [provider]  memantine (NAMENDA) 5 MG  tablet Take 5 mg by mouth 2 (two) times daily. 06/15/20   [provider]  temazepam (RESTORIL) 30 MG capsule Take 30 mg by mouth at bedtime.    [provider]  vitamin B-12 (CYANOCOBALAMIN) 1000 MCG tablet Take 1,000 mcg by mouth daily.    [provider]    Physical Exam: Vitals:   09/09/20 1708 09/09/20 1710 09/09/20 1730 09/09/20 1845  BP: (!) 175/86  (!) 148/81 (!) 165/77  Pulse: 68 62  (!) 36  Resp: (!) 22 12  18   Temp:      TempSrc:      SpO2: 92% 93%  93%  Weight:      Height:       Physical Exam Vitals and nursing note reviewed.  Constitutional:      General: He is not in acute distress.    Appearance: He is not ill-appearing.  HENT:     Head: Normocephalic and atraumatic.     Right Ear: External ear normal.     Left Ear: External ear normal.     Nose: Nose normal.  Eyes:     Extraocular Movements: Extraocular movements intact.     Pupils: Pupils are equal, round, and reactive to light.  Cardiovascular:     Rate and Rhythm: Normal rate and regular rhythm.     Pulses: Normal pulses.     Heart sounds: Normal heart sounds.  Pulmonary:     Effort: Pulmonary effort is normal.     Breath sounds: Normal breath sounds.  Abdominal:     General: Bowel sounds are normal. There is no distension.     Palpations: Abdomen is soft. There is no mass.     Tenderness: There is no abdominal tenderness. There is no guarding.     Hernia: No hernia is present.  Musculoskeletal:     Right lower leg: No edema.     Left lower leg: No edema.  Skin:    General: Skin is warm.  Neurological:     General: No focal deficit present.     Mental Status: He is disoriented.  Psychiatric:        Mood and Affect: Mood normal.        Behavior: Behavior normal.     Labs on Admission: I have personally reviewed following labs and imaging studies  No results for input(s): CKTOTAL, CKMB, TROPONINI in the last 72 hours. Lab Results  Component Value Date   WBC 11.6  (H) 09/09/2020   HGB 17.1 (H) 09/09/2020   HCT 47.8 09/09/2020   MCV 87.9 09/09/2020   PLT 142 (L) 09/09/2020    Recent Labs  Lab 09/09/20 1548  NA 137  K 4.1  CL 106  CO2 20*  BUN 24*  CREATININE 1.30*  CALCIUM 9.3  PROT 7.2  BILITOT 2.1*  ALKPHOS 89  ALT 22  AST 24  GLUCOSE 184*   No results found for: CHOL, HDL, LDLCALC, TRIG No results found for: DDIMER Invalid input(s): POCBNP  Urinalysis    Component Value Date/Time   COLORURINE YELLOW (A) 09/09/2020 1548   APPEARANCEUR CLEAR (A) 09/09/2020 1548   APPEARANCEUR Hazy (A) 09/08/2019 0948   LABSPEC 1.020 09/09/2020 1548   PHURINE 7.0 09/09/2020 1548   GLUCOSEU NEGATIVE 09/09/2020 1548   HGBUR SMALL (A) 09/09/2020 1548   BILIRUBINUR NEGATIVE 09/09/2020 1548   BILIRUBINUR Negative 09/08/2019 0948   KETONESUR 20 (A) 09/09/2020 1548   PROTEINUR 30 (A) 09/09/2020 1548   NITRITE NEGATIVE 09/09/2020 1548   LEUKOCYTESUR NEGATIVE 09/09/2020 1548    COVID-19 Labs Lab Results  Component Value Date   SARSCOV2NAA NEGATIVE 09/09/2020   SARSCOV2NAA NEGATIVE 07/11/2020   SARSCOV2NAA NEGATIVE 03/16/2020   SARSCOV2NAA NEGATIVE 02/01/2020    Radiological Exams on Admission: DG Chest 2 View  Result Date: 09/09/2020 CLINICAL DATA:  Vomiting and lethargy. EXAM: CHEST - 2 VIEW COMPARISON:  Chest radiograph dated 07/12/2020. FINDINGS: The heart size is normal. Vascular calcifications are seen in the aortic arch. The lung volumes are low. There is mild bibasilar atelectasis. There is no pleural effusion or pneumothorax. Degenerative changes are seen in the spine. A left subclavian approach cardiac device is redemonstrated. IMPRESSION: Low lung volumes and mild bibasilar atelectasis. Aortic Atherosclerosis (ICD10-I70.0). Electronically Signed   By: Romona Curls M.D.   On: 09/09/2020 19:06   CT Head Wo Contrast  Result Date: 09/09/2020 CLINICAL DATA:  Mental status change, unknown cause EXAM: CT HEAD WITHOUT CONTRAST TECHNIQUE:  Contiguous axial images were obtained from the base of the skull through the vertex without intravenous contrast. COMPARISON:  Jun 22, 2009; June 06, 2020 FINDINGS: Brain: No evidence of acute infarction, hemorrhage, hydrocephalus, extra-axial collection or mass lesion/mass effect. Remote LEFT cerebellar infarction. Periventricular white matter hypodensities consistent with sequela of chronic microvascular ischemic disease. Global parenchymal volume loss Vascular: Vascular calcifications. Skull: Normal. Negative for fracture or focal lesion. Sinuses/Orbits: No acute finding. Other: None. IMPRESSION: No acute intracranial abnormality. Electronically Signed   By: Meda Klinefelter MD   On: 09/09/2020 18:19   CT ABDOMEN PELVIS W CONTRAST  Result Date: 09/09/2020 CLINICAL DATA:  Nausea/vomiting Bowel obstruction suspected EXAM: CT ABDOMEN AND PELVIS WITH CONTRAST TECHNIQUE: Multidetector CT imaging of the abdomen and pelvis was performed using the standard protocol following bolus administration of intravenous contrast. CONTRAST:  OMNIPAQUE IOHEXOL 350 MG/ML SOLN COMPARISON:  November 08, 2019 FINDINGS: Lower chest: Bibasilar atelectasis.  Unchanged cardiomegaly. Hepatobiliary: Status post cholecystectomy. Unchanged dilation of the common bile duct to approximately 11 mm, likely due to post cholecystectomy state. Mild intrahepatic biliary prominence, unchanged. No new hepatic focal lesion. Portal vein is patent. Pancreas: Atrophic in appearance. No pancreatic ductal dilatation or surrounding inflammatory changes. Spleen: Normal in size without focal abnormality. Adrenals/Urinary Tract: Adrenal glands are unremarkable. LEFT renal cyst. No hydronephrosis. Kidneys enhance symmetrically. Bladder is unremarkable. Stomach/Bowel: Moderate hiatal hernia. No evidence of bowel obstruction. Diverticulosis of the sigmoid and descending colon. No evidence of acute diverticulitis. Moderate colonic stool burden diffusely  throughout the colon. Appendix is normal. Vascular/Lymphatic: Atherosclerotic calcifications of the nonaneurysmal aorta. No suspicious lymphadenopathy. Reproductive: Prostate is present. Other: Fat containing RIGHT inguinal hernia. No free air or free fluid. Musculoskeletal: Degenerative changes of the lumbar spine. IMPRESSION: 1. No CT etiology for acute abdominal  pain identified. Aortic Atherosclerosis (ICD10-I70.0). Electronically Signed   By: Meda Klinefelter MD   On: 09/09/2020 18:27    EKG: Independently reviewed.  Paced rhythm at 77.   Assessment/Plan Principal Problem:   Generalized weakness Active Problems:   Elevated troponin   CAD in native artery   Nausea & vomiting   Essential hypertension   Diabetes mellitus type 2, uncomplicated (HCC) Generalized weakness: Attribute to patient's nausea and vomiting acute episode and dehydration. Will admit patient to telemetry unit continuous cardiac monitoring. Obtain electrolyte and lipase start patient on fall and aspiration precautions.  CAD/ Elevated troponin: Initial troponin of 75 with a repeat of 85, we will continue to monitor. Also obtain a 2D echocardiogram.  Nausea and vomiting: Differentials include coronary event, gastritis, gastroenteritis from eating outside. Supportive care with antiemetics and IV fluids.   Essential hypertension: Blood pressure (!) 180/85, pulse 72, temperature 98.7 F (37.1 C), temperature source Oral, resp. rate 20, height 5\' 9"  (1.753 m), weight 90.3 kg, SpO2 94 %. I do not see any home meds for blood pressure, will start patient on hydralazine with as needed parameters.  Diabetes mellitus type 2: Will obtain an A1c and start patient on glycemic protocol if elevated.    DVT prophylaxis:  Heparin    Code Status:  Full code   Family Communication:  Eudy,[name] (Daughter)  [phone] (Mobile)   Disposition Plan:  Home    Consults called:  None   Admission status: Observation.      [phone] MD [facility] [phone] How to contact the [facility] Attending or Consulting provider 7A - 7P or covering provider during after hours 7P -7A, for this patient.    Check the care team in [facility] and look for a) attending/consulting [facility] provider listed and b) the [facility] team listed Log into [email or web address] and use [place]'s universal password to access. If you do not have the password, please contact the hospital operator. Locate the [facility] provider you are looking for under [facility] and page to a number that you can be directly reached. If you still have difficulty reaching the provider, please page the [facility] (Director on Call) for the Hospitalists listed on amion for assistance. [email or web address] Password [facility] 09/09/2020, 9:35 PM

## 2020-09-09 NOTE — ED Notes (Signed)
Troponin 75. Dr. Roxan Hockey informed via face to face conversation.

## 2020-09-09 NOTE — ED Notes (Signed)
Dr. Patel at bedside 

## 2020-09-09 NOTE — ED Notes (Signed)
Pt assisted with urination by helping him stand on the side of the bed

## 2020-09-09 NOTE — ED Notes (Signed)
Urine sent at this time with save label.

## 2020-09-09 NOTE — ED Provider Notes (Signed)
[facility] Emergency Department Provider Note    Event Date/Time   First MD Initiated Contact with Patient 09/09/20 1730     (approximate)  I have reviewed the triage vital signs and the nursing notes.   HISTORY  Chief Complaint Emesis  Level V Caveat:  AMS  HPI JOSHIA KITCHINGS is a 87 y.o. male below listed past medical history presents to the ER for evaluation nausea vomiting weakness and altered mental status starting this morning.  Has a history of Alzheimer's dementia.  Reportedly has been having some cough and posttussive emesis.  No measured fevers but family were at bedside states that he goes out to restaurants frequently with his friends and has not been very cautious regarding COVID.  Denies any pain at this time.  Denies any numbness or tingling.  Past Medical History:  Diagnosis Date   Aortic stenosis    Moderate (mean gradient 37, AVA 1.1 by cath in 01/2020)   Borderline diabetes    Complete heart block (HCC) 06/2020   s/p Med[employer] PPM 07/12/2020   Coronary artery disease 01/2020   Mild, nonobstructive dz by cath   Hyperlipidemia    Hypertension    Family History  Problem Relation Age of Onset   Congestive Heart Failure Mother    Heart attack Mother 43   Cirrhosis Father    COPD Sister    Past Surgical History:  Procedure Laterality Date   APPENDECTOMY     HERNIA REPAIR     PACEMAKER IMPLANT N/A 07/12/2020   Procedure: PACEMAKER IMPLANT;  Surgeon: Lanier Prude, MD;  Location: [facility] INVASIVE CV LAB;  Service: Cardiovascular;  Laterality: N/A;   RIGHT/LEFT HEART CATH AND CORONARY ANGIOGRAPHY N/A 02/03/2020   Procedure: RIGHT/LEFT HEART CATH AND CORONARY ANGIOGRAPHY;  Surgeon: Yvonne Kendall, MD;  Location: [facility] INVASIVE CV LAB;  Service: Cardiovascular;  Laterality: N/A;   TONSILLECTOMY     Patient Active Problem List   Diagnosis Date Noted   Complete heart block (HCC) 07/12/2020   DNR (do not resuscitate) 07/12/2020    Heart block AV third degree (HCC) 07/11/2020   Dementia without behavioral disturbance (HCC)    Heart block 05/05/2020   CAD in native artery 05/05/2020   AV block, 2nd degree 12/24/2019   Bradycardia 11/08/2019   Constipation 11/08/2019   Elevated troponin 11/08/2019   Diabetes mellitus type 2, uncomplicated (HCC) 11/08/2019   Hypertension 11/08/2019   Microhematuria 08/04/2019   BPH without obstruction/lower urinary tract symptoms 08/04/2019   Atypical chest pain 05/20/2018   Shortness of breath 05/20/2018   Aortic valve stenosis 05/20/2018   Nonrheumatic mitral valve regurgitation 05/20/2018   Essential hypertension 05/20/2018   Mixed hyperlipidemia 05/20/2018      Prior to Admission medications   Medication Sig Start Date End Date Taking? Authorizing Provider  aspirin EC 81 MG tablet Take 81 mg by mouth daily. Swallow whole.    [provider]  atorvastatin (LIPITOR) 20 MG tablet Take 20 mg by mouth daily.    [provider]  Cholecalciferol (D3-1000 PO) Take 1 tablet by mouth daily.    [provider]  citalopram (CELEXA) 20 MG tablet Take 20 mg by mouth daily. 08/18/19   [provider]  memantine (NAMENDA) 5 MG tablet Take 5 mg by mouth 2 (two) times daily. 06/15/20   [provider]  temazepam (RESTORIL) 30 MG capsule Take 30 mg by mouth at bedtime.    [provider]  vitamin B-12 (  CYANOCOBALAMIN) 1000 MCG tablet Take 1,000 mcg by mouth daily.    [provider]    Allergies Patient has no known allergies.    Social History Social History   Tobacco Use   Smoking status: Never   Smokeless tobacco: Never  Vaping Use   Vaping Use: Never used  Substance Use Topics   Alcohol use: Not Currently    Alcohol/week: 2.0 standard drinks    Types: 1 Cans of beer, 1 Shots of liquor per week    Comment: last drink was Saturday, 04/22/2020.   Drug use: No    Review of Systems Patient denies headaches,  rhinorrhea, blurry vision, numbness, shortness of breath, chest pain, edema, cough, abdominal pain, nausea, vomiting, diarrhea, dysuria, fevers, rashes or hallucinations unless otherwise stated above in HPI. ____________________________________________   PHYSICAL EXAM:  VITAL SIGNS: Vitals:   09/09/20 1730 09/09/20 1845  BP: (!) 148/81 (!) 165/77  Pulse:  (!) 36  Resp:  18  Temp:    SpO2:  93%    Constitutional: Alert, frail appearing Eyes: Conjunctivae are normal.  Head: Atraumatic. Nose: No congestion/rhinnorhea. Mouth/Throat: Mucous membranes are moist.   Neck: No stridor. Painless ROM.  Cardiovascular: Normal rate, regular rhythm. Grossly normal heart sounds.  Good peripheral circulation. Respiratory: Normal respiratory effort.  No retractions. Lungs coarse bibasilar breathsounds Gastrointestinal: Soft and nontender. No distention. No abdominal bruits. No CVA tenderness. Genitourinary: deferred Musculoskeletal: No lower extremity tenderness nor edema.  No joint effusions. Neurologic:  Normal speech and language. Left lateral nystagmus. No gross focal neurologic deficits are appreciated. No facial droop Skin:  Skin is warm, dry and intact. No rash noted. Psychiatric: calm and cooperative  ____________________________________________   LABS (all labs ordered are listed, but only abnormal results are displayed)  Results for orders placed or performed during the hospital encounter of 09/09/20 (from the past 24 hour(s))  Comprehensive metabolic panel     Status: Abnormal   Collection Time: 09/09/20  3:48 PM  Result Value Ref Range   Sodium 137 135 - 145 mmol/L   Potassium 4.1 3.5 - 5.1 mmol/L   Chloride 106 98 - 111 mmol/L   CO2 20 (L) 22 - 32 mmol/L   Glucose, Bld 184 (H) 70 - 99 mg/dL   BUN 24 (H) 8 - 23 mg/dL   Creatinine, Ser 1.49 (H) 0.61 - 1.24 mg/dL   Calcium 9.3 8.9 - 70.2 mg/dL   Total Protein 7.2 6.5 - 8.1 g/dL   Albumin 3.8 3.5 - 5.0 g/dL   AST 24 15 - 41  U/L   ALT 22 0 - 44 U/L   Alkaline Phosphatase 89 38 - 126 U/L   Total Bilirubin 2.1 (H) 0.3 - 1.2 mg/dL   GFR, Estimated 54 (L) >60 mL/min   Anion gap 11 5 - 15  CBC     Status: Abnormal   Collection Time: 09/09/20  3:48 PM  Result Value Ref Range   WBC 11.6 (H) 4.0 - 10.5 K/uL   RBC 5.44 4.22 - 5.81 MIL/uL   Hemoglobin 17.1 (H) 13.0 - 17.0 g/dL   HCT 63.7 85.8 - 85.0 %   MCV 87.9 80.0 - 100.0 fL   MCH 31.4 26.0 - 34.0 pg   MCHC 35.8 30.0 - 36.0 g/dL   RDW 27.7 41.2 - 87.8 %   Platelets 142 (L) 150 - 400 K/uL   nRBC 0.0 0.0 - 0.2 %  Urinalysis, Complete w Microscopic     Status: Abnormal  Collection Time: 09/09/20  3:48 PM  Result Value Ref Range   Color, Urine YELLOW (A) YELLOW   APPearance CLEAR (A) CLEAR   Specific Gravity, Urine 1.020 1.005 - 1.030   pH 7.0 5.0 - 8.0   Glucose, UA NEGATIVE NEGATIVE mg/dL   Hgb urine dipstick SMALL (A) NEGATIVE   Bilirubin Urine NEGATIVE NEGATIVE   Ketones, ur 20 (A) NEGATIVE mg/dL   Protein, ur 30 (A) NEGATIVE mg/dL   Nitrite NEGATIVE NEGATIVE   Leukocytes,Ua NEGATIVE NEGATIVE   RBC / HPF 6-10 0 - 5 RBC/hpf   WBC, UA 0-5 0 - 5 WBC/hpf   Bacteria, UA NONE SEEN NONE SEEN   Squamous Epithelial / LPF 0-5 0 - 5   Mucus PRESENT   Troponin I (High Sensitivity)     Status: Abnormal   Collection Time: 09/09/20  3:48 PM  Result Value Ref Range   Troponin I (High Sensitivity) 35 (H) <18 ng/L  Resp Panel by RT-PCR (Flu A&B, Covid) Nasopharyngeal Swab     Status: None   Collection Time: 09/09/20  5:53 PM   Specimen: Nasopharyngeal Swab; Nasopharyngeal(NP) swabs in vial transport medium  Result Value Ref Range   SARS Coronavirus 2 by RT PCR NEGATIVE NEGATIVE   Influenza A by PCR NEGATIVE NEGATIVE   Influenza B by PCR NEGATIVE NEGATIVE  Troponin I (High Sensitivity)     Status: Abnormal   Collection Time: 09/09/20  7:58 PM  Result Value Ref Range   Troponin I (High Sensitivity) 75 (H) <18 ng/L    ____________________________________________  EKG My review and personal interpretation at Time: 15:44   Indication: n/v  Rate: 70  Rhythm: a-s v-p Axis: left Other: paced rhythm, abnml ekg ____________________________________________  RADIOLOGY  I personally reviewed all radiographic images ordered to evaluate for the above acute complaints and reviewed radiology reports and findings.  These findings were personally discussed with the patient.  Please see medical record for radiology report.  ____________________________________________   PROCEDURES  Procedure(s) performed:  Procedures    Critical Care performed: no ____________________________________________   INITIAL IMPRESSION / ASSESSMENT AND PLAN / ED COURSE  Pertinent labs & imaging results that were available during my care of the patient were reviewed by me and considered in my medical decision making (see chart for details).   DDX: Dehydration, electrolyte abnormality, enteritis, gastritis, diverticulitis, COVID, pneumonia, sepsis, CVA, vertigo  SHARBEL SAHAGUN is a 87 y.o. who presents to the ED with presentation as described above.  Afvss.  Exam as above.  Paced rhythm without sgarbossa, appears weak.  Will give IVF.  Will check labs and ua.  Will order CT head and abd/pelv.  The patient will be placed on continuous pulse oximetry and telemetry for monitoring.  Laboratory evaluation will be sent to evaluate for the above complaints.     Clinical Course as of 09/09/20 2105  Sat Sep 09, 2020  2032 Reassessed.  Feels much improved after IV hydration.  Work-up thus far is fairly unremarkable.  This does not seem consistent with CVA.  Possible dehydration or viral enteritis.  Will trial p.o.  Will repeat troponin and make sure that is stable but I doubt ACS or cardiac etiology as appears consistent with his baseline. [PR]  2100 Patient with rising trop.  Denies chest pain, but given h/o dementia somewhat unreliable  historian.  Will continue with IV hydration, will discuss with hospitalist for admission. [PR]    Clinical Course User Index [PR] Willy Eddy, MD  The patient was evaluated in Emergency Department today for the symptoms described in the history of present illness. He/she was evaluated in the context of the global COVID-19 pandemic, which necessitated consideration that the patient might be at risk for infection with the SARS-CoV-2 virus that causes COVID-19. Institutional protocols and algorithms that pertain to the evaluation of patients at risk for COVID-19 are in a state of rapid change based on information released by regulatory bodies including the CDC and federal and state organizations. These policies and algorithms were followed during the patient's care in the ED.  As part of my medical decision making, I reviewed the following data within the electronic MEDICAL RECORD NUMBER Nursing notes reviewed and incorporated, Labs reviewed, notes from prior ED visits and [place] Controlled Substance Database   ____________________________________________   FINAL CLINICAL IMPRESSION(S) / ED DIAGNOSES  Final diagnoses:  Non-intractable vomiting with nausea, unspecified vomiting type  Weakness  Dehydration      NEW MEDICATIONS STARTED DURING THIS VISIT:  New Prescriptions   No medications on file     Note:  This document was prepared using Dragon voice recognition software and may include unintentional dictation errors.    Willy Eddyobinson, Brayten Komar, MD 09/09/20 2105

## 2020-09-09 NOTE — ED Triage Notes (Signed)
Pt in via EMS from home with c/o vomiting since 0830 this am. Pt has been weak all day, BP 170/120, (-) stroke screen for EMS. HR 90, RR 18, 98% RA, FSBS 153

## 2020-09-09 NOTE — ED Triage Notes (Signed)
Pt via EMS from home. Pt c/o vomiting and lethargy since this AM. Pt states he really doesn't know what is going. Pt is alert and oriented to self, place, and situation.   Pt has a hx of dementia.

## 2020-09-10 DIAGNOSIS — I493 Ventricular premature depolarization: Secondary | ICD-10-CM | POA: Diagnosis present

## 2020-09-10 DIAGNOSIS — K529 Noninfective gastroenteritis and colitis, unspecified: Secondary | ICD-10-CM | POA: Diagnosis present

## 2020-09-10 DIAGNOSIS — D696 Thrombocytopenia, unspecified: Secondary | ICD-10-CM | POA: Diagnosis present

## 2020-09-10 DIAGNOSIS — I2510 Atherosclerotic heart disease of native coronary artery without angina pectoris: Secondary | ICD-10-CM | POA: Diagnosis present

## 2020-09-10 DIAGNOSIS — R4701 Aphasia: Secondary | ICD-10-CM | POA: Diagnosis present

## 2020-09-10 DIAGNOSIS — I639 Cerebral infarction, unspecified: Secondary | ICD-10-CM | POA: Diagnosis not present

## 2020-09-10 DIAGNOSIS — I160 Hypertensive urgency: Secondary | ICD-10-CM | POA: Diagnosis present

## 2020-09-10 DIAGNOSIS — I619 Nontraumatic intracerebral hemorrhage, unspecified: Secondary | ICD-10-CM | POA: Diagnosis present

## 2020-09-10 DIAGNOSIS — G309 Alzheimer's disease, unspecified: Secondary | ICD-10-CM | POA: Diagnosis present

## 2020-09-10 DIAGNOSIS — E1165 Type 2 diabetes mellitus with hyperglycemia: Secondary | ICD-10-CM | POA: Diagnosis present

## 2020-09-10 DIAGNOSIS — F32A Depression, unspecified: Secondary | ICD-10-CM | POA: Diagnosis present

## 2020-09-10 DIAGNOSIS — F0280 Dementia in other diseases classified elsewhere without behavioral disturbance: Secondary | ICD-10-CM | POA: Diagnosis present

## 2020-09-10 DIAGNOSIS — E860 Dehydration: Secondary | ICD-10-CM | POA: Diagnosis present

## 2020-09-10 DIAGNOSIS — I6389 Other cerebral infarction: Secondary | ICD-10-CM | POA: Diagnosis not present

## 2020-09-10 DIAGNOSIS — R29701 NIHSS score 1: Secondary | ICD-10-CM | POA: Diagnosis present

## 2020-09-10 DIAGNOSIS — I442 Atrioventricular block, complete: Secondary | ICD-10-CM | POA: Diagnosis present

## 2020-09-10 DIAGNOSIS — R112 Nausea with vomiting, unspecified: Secondary | ICD-10-CM | POA: Diagnosis present

## 2020-09-10 DIAGNOSIS — I248 Other forms of acute ischemic heart disease: Secondary | ICD-10-CM | POA: Diagnosis present

## 2020-09-10 DIAGNOSIS — Z66 Do not resuscitate: Secondary | ICD-10-CM | POA: Diagnosis not present

## 2020-09-10 DIAGNOSIS — Z23 Encounter for immunization: Secondary | ICD-10-CM | POA: Diagnosis not present

## 2020-09-10 DIAGNOSIS — E538 Deficiency of other specified B group vitamins: Secondary | ICD-10-CM | POA: Diagnosis present

## 2020-09-10 DIAGNOSIS — N179 Acute kidney failure, unspecified: Secondary | ICD-10-CM | POA: Diagnosis present

## 2020-09-10 DIAGNOSIS — I6502 Occlusion and stenosis of left vertebral artery: Secondary | ICD-10-CM | POA: Diagnosis not present

## 2020-09-10 DIAGNOSIS — E785 Hyperlipidemia, unspecified: Secondary | ICD-10-CM | POA: Diagnosis present

## 2020-09-10 DIAGNOSIS — I63542 Cerebral infarction due to unspecified occlusion or stenosis of left cerebellar artery: Secondary | ICD-10-CM | POA: Diagnosis present

## 2020-09-10 DIAGNOSIS — R531 Weakness: Secondary | ICD-10-CM | POA: Diagnosis not present

## 2020-09-10 DIAGNOSIS — Z20822 Contact with and (suspected) exposure to covid-19: Secondary | ICD-10-CM | POA: Diagnosis present

## 2020-09-10 DIAGNOSIS — I350 Nonrheumatic aortic (valve) stenosis: Secondary | ICD-10-CM | POA: Diagnosis not present

## 2020-09-10 DIAGNOSIS — I080 Rheumatic disorders of both mitral and aortic valves: Secondary | ICD-10-CM | POA: Diagnosis present

## 2020-09-10 DIAGNOSIS — I10 Essential (primary) hypertension: Secondary | ICD-10-CM | POA: Diagnosis present

## 2020-09-10 LAB — CBC
HCT: 43.2 % (ref 39.0–52.0)
Hemoglobin: 15.2 g/dL (ref 13.0–17.0)
MCH: 30.8 pg (ref 26.0–34.0)
MCHC: 35.2 g/dL (ref 30.0–36.0)
MCV: 87.6 fL (ref 80.0–100.0)
Platelets: 128 10*3/uL — ABNORMAL LOW (ref 150–400)
RBC: 4.93 MIL/uL (ref 4.22–5.81)
RDW: 13.1 % (ref 11.5–15.5)
WBC: 9.5 10*3/uL (ref 4.0–10.5)
nRBC: 0 % (ref 0.0–0.2)

## 2020-09-10 LAB — COMPREHENSIVE METABOLIC PANEL
ALT: 16 U/L (ref 0–44)
AST: 18 U/L (ref 15–41)
Albumin: 3.3 g/dL — ABNORMAL LOW (ref 3.5–5.0)
Alkaline Phosphatase: 77 U/L (ref 38–126)
Anion gap: 6 (ref 5–15)
BUN: 21 mg/dL (ref 8–23)
CO2: 24 mmol/L (ref 22–32)
Calcium: 8.6 mg/dL — ABNORMAL LOW (ref 8.9–10.3)
Chloride: 108 mmol/L (ref 98–111)
Creatinine, Ser: 1.04 mg/dL (ref 0.61–1.24)
GFR, Estimated: >60 mL/min (ref >60)
Glucose, Bld: 131 mg/dL — ABNORMAL HIGH (ref 70–99)
Potassium: 3.7 mmol/L (ref 3.5–5.1)
Sodium: 138 mmol/L (ref 135–145)
Total Bilirubin: 2.1 mg/dL — ABNORMAL HIGH (ref 0.3–1.2)
Total Protein: 5.9 g/dL — ABNORMAL LOW (ref 6.5–8.1)

## 2020-09-10 LAB — TROPONIN I (HIGH SENSITIVITY): Troponin I (High Sensitivity): 79 ng/L — ABNORMAL HIGH (ref <18)

## 2020-09-10 MED ORDER — ATORVASTATIN CALCIUM 20 MG PO TABS
20.0000 mg | ORAL_TABLET | Freq: Every day | ORAL | Status: DC
  Administered 2020-09-10 – 2020-09-11 (×2): 20 mg via ORAL
  Filled 2020-09-10 (×2): qty 1

## 2020-09-10 MED ORDER — ONDANSETRON HCL 4 MG/2ML IJ SOLN
4.0000 mg | Freq: Four times a day (QID) | INTRAMUSCULAR | Status: DC | PRN
  Administered 2020-09-10: 4 mg via INTRAVENOUS
  Filled 2020-09-10: qty 2

## 2020-09-10 MED ORDER — ACETAMINOPHEN 325 MG PO TABS
650.0000 mg | ORAL_TABLET | Freq: Four times a day (QID) | ORAL | Status: DC | PRN
  Administered 2020-09-10: 650 mg via ORAL
  Filled 2020-09-10 (×2): qty 2

## 2020-09-10 MED ORDER — ZOLPIDEM TARTRATE 5 MG PO TABS
5.0000 mg | ORAL_TABLET | Freq: Every evening | ORAL | Status: DC | PRN
  Administered 2020-09-10: 5 mg via ORAL
  Filled 2020-09-10: qty 1

## 2020-09-10 MED ORDER — AMLODIPINE BESYLATE 5 MG PO TABS
5.0000 mg | ORAL_TABLET | Freq: Every day | ORAL | Status: DC
  Administered 2020-09-10 – 2020-09-15 (×6): 5 mg via ORAL
  Filled 2020-09-10 (×6): qty 1

## 2020-09-10 NOTE — ED Notes (Signed)
Current BP is elevated at 196/80. Pt Given PRN hypertension medication, see MAR. Will reassess BP in 15 min. Cardiac monitoring continued at this time. Patient resting comfortably.

## 2020-09-10 NOTE — ED Notes (Addendum)
Stood patient at bedside and assisted with the urinal. Pt now resting comfortably in bed.

## 2020-09-10 NOTE — Evaluation (Signed)
Physical Therapy Evaluation Patient Details Name: Timothy Ramirez MRN: [id number] DOB: 1933-04-20 Today's Date: 09/10/2020   History of Present Illness  Pt is an 87 y/o M admitted on 09/09/20 with c/c of generalized weakness, AMS, & N&V. PMH: aortic stenosis, borderline DM, complete heart block, heart disease, HLD, HTN, alzheimer's dementia  Clinical Impression  Pt seen for PT evaluation with daughter in law & granddaughter present in room. Pt's family has been providing 24 hr care for ~1 year in the pt's home 2/2 dementia. Pt is oriented to self & location on this date. Pt is able to complete supine<>sitting EOB with min assist with extra time & assistance to elevate trunk. Pt sits EOB ~4 minutes but endorses dizziness - no nystagmus noted. BP 197/93 mmHg (MAP 116) in LUE but pt moving arm frequently. Pt returns supine & noted to be incontinent of urine. Pt is able to bridge multiple times to allow PT & nurse to change bed sheets & don clean brief. Nurse notified of elevated BP & rechecked in LUE 176/146 mmHg (MAP 157) then in RUE 196/80 mmHg (MAP 115). Pt's family reports they cannot provide the physical assistance pt currently needs & are in agreement with STR upon d/c to maximize independence with mobility & reduce fall risk & caregiver burden prior to return home.   MD made aware of elevated BP & dizziness limiting mobility.     Follow Up Recommendations SNF;Supervision/Assistance - 24 hour    Equipment Recommendations  None recommended by PT (TBD in next venue)    Recommendations for Other Services       Precautions / Restrictions Precautions Precautions: Fall Restrictions Weight Bearing Restrictions: No      Mobility  Bed Mobility Overal bed mobility: Needs Assistance Bed Mobility: Supine to Sit;Sit to Supine     Supine to sit: Min assist;HOB elevated Sit to supine: Min guard;HOB elevated   General bed mobility comments: extra time & BUE support to upright trunk to sitting  EOB    Transfers                    Ambulation/Gait                Stairs            Wheelchair Mobility    Modified Rankin (Stroke Patients Only)       Balance Overall balance assessment: Needs assistance Sitting-balance support: Feet supported;Bilateral upper extremity supported Sitting balance-Leahy Scale: Fair Sitting balance - Comments: close supervision<>Min assist static sitting EOB                                     Pertinent Vitals/Pain Pain Assessment: No/denies pain    Home Living Family/patient expects to be discharged to:: Private residence Living Arrangements: Children;Other relatives Available Help at Discharge: Family;Available 24 hours/day (multiple family members rotate & provide 24 hr supervision, they have been providing this for the past year) Type of Home: House Home Access: Ramped entrance     Home Layout: One level Home Equipment: Cane - single point      Prior Function Level of Independence: Needs assistance   Gait / Transfers Assistance Needed: Ambulatory without AD. No falls in past 6 months. Household ambulator.  ADL's / Homemaking Assistance Needed: Family assists with all cooking, cleaning, driving, appointments.        Hand Dominance  Extremity/Trunk Assessment   Upper Extremity Assessment Upper Extremity Assessment: Generalized weakness    Lower Extremity Assessment Lower Extremity Assessment: Generalized weakness    Cervical / Trunk Assessment Cervical / Trunk Assessment: Kyphotic  Communication   Communication: No difficulties  Cognition Arousal/Alertness: Awake/alert Behavior During Therapy: Restless Overall Cognitive Status: History of cognitive impairments - at baseline                                 General Comments: Oriented to location & self but not year. Perseverating on urinating during session. Requires frequent redirection throughout session for  engagement.      General Comments      Exercises     Assessment/Plan    PT Assessment Patient needs continued PT services  PT Problem List Decreased strength;Decreased safety awareness;Decreased mobility;Obesity;Decreased activity tolerance;Decreased cognition;Decreased balance;Decreased knowledge of use of DME       PT Treatment Interventions DME instruction;Therapeutic exercise;Gait training;Balance training;Functional mobility training;Cognitive remediation;Therapeutic activities;Patient/family education;Modalities;Neuromuscular re-education    PT Goals (Current goals can be found in the Care Plan section)  Acute Rehab PT Goals Patient Stated Goal: go to rehab PT Goal Formulation: With family Time For Goal Achievement: 09/24/20 Potential to Achieve Goals: Good    Frequency Min 2X/week   Barriers to discharge Decreased caregiver support familiy unable to provide physical assistance at d/c    Co-evaluation               AM-PAC PT "6 Clicks" Mobility  Outcome Measure Help needed turning from your back to your side while in a flat bed without using bedrails?: A Little Help needed moving from lying on your back to sitting on the side of a flat bed without using bedrails?: A Little Help needed moving to and from a bed to a chair (including a wheelchair)?: A Little Help needed standing up from a chair using your arms (e.g., wheelchair or bedside chair)?: A Little Help needed to walk in hospital room?: A Lot Help needed climbing 3-5 steps with a railing? : A Lot 6 Click Score: 16    End of Session   Activity Tolerance:  (limited by dizziness) Patient left: in bed;with call bell/phone within reach;with nursing/sitter in room;with family/visitor present Nurse Communication: Mobility status PT Visit Diagnosis: Unsteadiness on feet (R26.81);Muscle weakness (generalized) (M62.81);Difficulty in walking, not elsewhere classified (R26.2)    Time: 1207-1230 PT Time  Calculation (min) (ACUTE ONLY): 23 min   Charges:   PT Evaluation $PT Eval Moderate Complexity: 1 Mod PT Treatments $Therapeutic Activity: 8-22 mins        Aleda Grana, PT, DPT 09/10/20, 2:15 PM   Sandi Mariscal 09/10/2020, 2:12 PM

## 2020-09-10 NOTE — ED Notes (Signed)
Patient cleaned and bed linen changed. Repositioned patient in bed. Patient is resting comfortably at this time. Call light in reach. Fall precautions in place. Patients granddaughter at bedside.

## 2020-09-10 NOTE — ED Notes (Signed)
Pt primofit not attached correctly. Pt soiled in urine. Pt pad, primofit and gown changed at this time. Pt repositioned in the bed. Breakfast tray given. Son at bedside at this time.

## 2020-09-10 NOTE — Progress Notes (Signed)
Pt expressed the need to  urinate. Pt unsteady gait when out of bed. Pt expressed "I am feeling dizzy." Assisted pt back to bed. Pt using urinal at this time.

## 2020-09-10 NOTE — Progress Notes (Signed)
PROGRESS NOTE    Timothy Ramirez  GBT:[id number] DOB: September 14, 1933 DOA: 09/09/2020 PCP: Jerl Mina, MD   Brief Narrative:  Timothy Ramirez is a 87 y.o. male seen in ed with complaints of generalized weakness and ams along with N/V. Pt was doing well at his baseline today am and ? If he has eaten outside but started to have nausea and vomiting and brought to ed.    Pt has past medical history of aortic stenosis, borderline diabetes, complete heart block, heart disease, hyperlipidemia, hypertension.Pt drank heavily for a year and family did not know and until 4 months ago family found out he no longer drinks.    ED Course:  Blood work shows a CMP with a glucose of 184 creatinine of 1.30 with a GFR 54, initial troponin of 30 5 repeat troponin of 75, CBC is within normal limits.  Respiratory panel is negative for influenza and COVID, urinalysis is negative, CT head is negative for any acute intracranial abnormality, CT of the abdomen and pelvis is negative for any acute abnormalities, but it does show moderate hiatal hernia, diverticulosis, moderate colonic stool burden, no lymphadenopathy.  Patient's last echocardiogram was in 03-2018 report as below: Left ventricular cavity size is normal with an EF of 55%, mild to moderate increased left ventricular wall thickness grade 1 diastolic dysfunction, severe calcification of the aortic valve, moderate stenosis of the aortic valve.  In the emergency room patient received aspirin 324, normal saline bolus 1000 mL (for nausea, ongoing maintenance IV fluid with normal saline at 150 mL/h.  I have ordered magnesium level, lipase, thyroid function test, CPK level.  Assessment & Plan:   Principal Problem:   Generalized weakness Active Problems:   Essential hypertension   Elevated troponin   Diabetes mellitus type 2, uncomplicated (HCC)   CAD in native artery   Nausea & vomiting  Generalized weakness debility: Likely secondary to acute event of  gastroenteritis.  Per son, patient typically walks by himself without use of any DME.  I asked PT OT to assess him today since he was feeling better.  However, when they started working with him, his blood pressure was high and he was feeling dizzy so they could not complete assessment.  They recommended SNF.  CAD/ Elevated troponin: Troponin seem to be chronically elevated.  No ACS symptoms.  Gastroenteritis: Patient feels better.  No nausea or vomiting.  Had not ordered any breakfast when I saw him.  Continue regular diet.  Continue symptomatic treatment as needed.  AKI VS CKD stage IIIa: Patient has variable creatinines based off of the records available.  Unable to tell whether this is AKI or CKD stage IIIa.  His current creatinine is so much better that he does not fit criteria for CKD stage IIIa either.  We will repeat labs in the morning.   Hypertensive urgency: He does not seem to be on any antihypertensives.  Blood pressure as high as 196/80 currently.  Will start on amlodipine 5 mg and continue as needed hydralazine.  Diabetes mellitus type 2: Previously hemoglobin A1c was 6.3.  This time it is pending.  He is not on any medications.  Slightly hyperglycemic.  No need of SSI.  Chronic thrombocytopenia: Stable.  No signs of bleeding.  Monitor.  DVT prophylaxis: heparin injection 5,000 Units Start: 09/09/20 2200 SCDs Start: 09/09/20 2119   Code Status: Full Code  Family Communication: None present at bedside.  Plan of care discussed with patient in length and he  verbalized understanding and agreed with it.  Status is: Observation  The patient will require care spanning > 2 midnights and should be moved to inpatient because: Unsafe d/c plan  Dispo: The patient is from: Home              Anticipated d/c is to: SNF              Patient currently is not medically stable to d/c.   Difficult to place patient No        Estimated body mass index is 29.39 kg/m as calculated from the  following:   Height as of this encounter: 5\' 9"  (1.753 m).   Weight as of this encounter: 90.3 kg.      Nutritional status:               Consultants:  None  Procedures:  None  Antimicrobials:  Anti-infectives (From admission, onward)    None          Subjective: Patient seen and examined this morning.  Son was at the bedside.  Patient awake but was weak.  Complaining of weakness.  No nausea, abdominal pain diarrhea, or vomiting.  Objective: Vitals:   09/10/20 1000 09/10/20 1204 09/10/20 1215 09/10/20 1230  BP: (!) 152/80 (!) 143/71 (!) 197/93 (!) 196/80  Pulse: 60 60 63 61  Resp: 14 14 19 18   Temp:      TempSrc:      SpO2: 92% 91% 95% 93%  Weight:      Height:        Intake/Output Summary (Last 24 hours) at 09/10/2020 1235 Last data filed at 09/09/2020 1950 Gross per 24 hour  Intake 1000 ml  Output 275 ml  Net 725 ml   Filed Weights   09/09/20 1534  Weight: 90.3 kg    Examination:  General exam: Appears weak. Respiratory system: Clear to auscultation. Respiratory effort normal. Cardiovascular system: S1 & S2 heard, RRR. No JVD, murmurs, rubs, gallops or clicks. No pedal edema. Gastrointestinal system: Abdomen is nondistended, soft and nontender. No organomegaly or masses felt. Normal bowel sounds heard. Central nervous system: Alert and oriented. No focal neurological deficits. Extremities: Symmetric 5 x 5 power. Skin: No rashes, lesions or ulcers  Data Reviewed: I have personally reviewed following labs and imaging studies  CBC: Recent Labs  Lab 09/09/20 1548 09/10/20 0651  WBC 11.6* 9.5  HGB 17.1* 15.2  HCT 47.8 43.2  MCV 87.9 87.6  PLT 142* 128*   Basic Metabolic Panel: Recent Labs  Lab 09/09/20 1548 09/09/20 2130 09/10/20 0651  NA 137  --  138  K 4.1  --  3.7  CL 106  --  108  CO2 20*  --  24  GLUCOSE 184*  --  131*  BUN 24*  --  21  CREATININE 1.30*  --  1.04  CALCIUM 9.3  --  8.6*  MG  --  1.9  --     GFR: Estimated Creatinine Clearance: 58.7 mL/min (by C-G formula based on SCr of 1.04 mg/dL). Liver Function Tests: Recent Labs  Lab 09/09/20 1548 09/10/20 0651  AST 24 18  ALT 22 16  ALKPHOS 89 77  BILITOT 2.1* 2.1*  PROT 7.2 5.9*  ALBUMIN 3.8 3.3*   Recent Labs  Lab 09/09/20 2130  LIPASE 19   No results for input(s): AMMONIA in the last 168 hours. Coagulation Profile: No results for input(s): INR, PROTIME in the last 168 hours. Cardiac Enzymes: Recent Labs  Lab 09/09/20 2130  CKTOTAL 55   BNP (last 3 results) No results for input(s): PROBNP in the last 8760 hours. HbA1C: No results for input(s): HGBA1C in the last 72 hours. CBG: No results for input(s): GLUCAP in the last 168 hours. Lipid Profile: No results for input(s): CHOL, HDL, LDLCALC, TRIG, CHOLHDL, LDLDIRECT in the last 72 hours. Thyroid Function Tests: Recent Labs    09/09/20 2130  TSH 0.841  FREET4 1.00   Anemia Panel: No results for input(s): VITAMINB12, FOLATE, FERRITIN, TIBC, IRON, RETICCTPCT in the last 72 hours. Sepsis Labs: No results for input(s): PROCALCITON, LATICACIDVEN in the last 168 hours.  Recent Results (from the past 240 hour(s))  Resp Panel by RT-PCR (Flu A&B, Covid) Nasopharyngeal Swab     Status: None   Collection Time: 09/09/20  5:53 PM   Specimen: Nasopharyngeal Swab; Nasopharyngeal(NP) swabs in vial transport medium  Result Value Ref Range Status   SARS Coronavirus 2 by RT PCR NEGATIVE NEGATIVE Final    Comment: (NOTE) SARS-CoV-2 target nucleic acids are NOT DETECTED.  The SARS-CoV-2 RNA is generally detectable in upper respiratory specimens during the acute phase of infection. The lowest concentration of SARS-CoV-2 viral copies this assay can detect is 138 copies/mL. A negative result does not preclude SARS-Cov-2 infection and should not be used as the sole basis for treatment or other patient management decisions. A negative result may occur with  improper specimen  collection/handling, submission of specimen other than nasopharyngeal swab, presence of viral mutation(s) within the areas targeted by this assay, and inadequate number of viral copies(<138 copies/mL). A negative result must be combined with clinical observations, patient history, and epidemiological information. The expected result is Negative.  Fact Sheet for Patients:  BloggerCourse.com  Fact Sheet for Healthcare Providers:  SeriousBroker.it  This test is no t yet approved or cleared by the [place] FDA and  has been authorized for detection and/or diagnosis of SARS-CoV-2 by FDA under an Emergency Use Authorization (EUA). This EUA will remain  in effect (meaning this test can be used) for the duration of the COVID-19 declaration under Section 564(b)(1) of the Act, 21 U.S.C.section 360bbb-3(b)(1), unless the authorization is terminated  or revoked sooner.       Influenza A by PCR NEGATIVE NEGATIVE Final   Influenza B by PCR NEGATIVE NEGATIVE Final    Comment: (NOTE) The Xpert Xpress SARS-CoV-2/FLU/RSV plus assay is intended as an aid in the diagnosis of influenza from Nasopharyngeal swab specimens and should not be used as a sole basis for treatment. Nasal washings and aspirates are unacceptable for Xpert Xpress SARS-CoV-2/FLU/RSV testing.  Fact Sheet for Patients: BloggerCourse.com  Fact Sheet for Healthcare Providers: SeriousBroker.it  This test is not yet approved or cleared by the [place] FDA and has been authorized for detection and/or diagnosis of SARS-CoV-2 by FDA under an Emergency Use Authorization (EUA). This EUA will remain in effect (meaning this test can be used) for the duration of the COVID-19 declaration under Section 564(b)(1) of the Act, 21 U.S.C. section 360bbb-3(b)(1), unless the authorization is terminated or revoked.  Performed at [facility], [address], [place], [place] [address]       Radiology Studies: DG Chest 2 View  Result Date: 09/09/2020 CLINICAL DATA:  Vomiting and lethargy. EXAM: CHEST - 2 VIEW COMPARISON:  Chest radiograph dated 07/12/2020. FINDINGS: The heart size is normal. Vascular calcifications are seen in the aortic arch. The lung volumes are low. There is mild bibasilar atelectasis. There  is no pleural effusion or pneumothorax. Degenerative changes are seen in the spine. A left subclavian approach cardiac device is redemonstrated. IMPRESSION: Low lung volumes and mild bibasilar atelectasis. Aortic Atherosclerosis (ICD10-I70.0). Electronically Signed   By: Romona Curlsyler  Litton M.D.   On: 09/09/2020 19:06   CT Head Wo Contrast  Result Date: 09/09/2020 CLINICAL DATA:  Mental status change, unknown cause EXAM: CT HEAD WITHOUT CONTRAST TECHNIQUE: Contiguous axial images were obtained from the base of the skull through the vertex without intravenous contrast. COMPARISON:  Jun 22, 2009; June 06, 2020 FINDINGS: Brain: No evidence of acute infarction, hemorrhage, hydrocephalus, extra-axial collection or mass lesion/mass effect. Remote LEFT cerebellar infarction. Periventricular white matter hypodensities consistent with sequela of chronic microvascular ischemic disease. Global parenchymal volume loss Vascular: Vascular calcifications. Skull: Normal. Negative for fracture or focal lesion. Sinuses/Orbits: No acute finding. Other: None. IMPRESSION: No acute intracranial abnormality. Electronically Signed   By: Meda KlinefelterStephanie  Peacock MD   On: 09/09/2020 18:19   CT ABDOMEN PELVIS W CONTRAST  Result Date: 09/09/2020 CLINICAL DATA:  Nausea/vomiting Bowel obstruction suspected EXAM: CT ABDOMEN AND PELVIS WITH CONTRAST TECHNIQUE: Multidetector CT imaging of the abdomen and pelvis was performed using the standard protocol following bolus administration of intravenous contrast. CONTRAST:  100mL OMNIPAQUE IOHEXOL 350 MG/ML SOLN  COMPARISON:  November 08, 2019 FINDINGS: Lower chest: Bibasilar atelectasis.  Unchanged cardiomegaly. Hepatobiliary: Status post cholecystectomy. Unchanged dilation of the common bile duct to approximately 11 mm, likely due to post cholecystectomy state. Mild intrahepatic biliary prominence, unchanged. No new hepatic focal lesion. Portal vein is patent. Pancreas: Atrophic in appearance. No pancreatic ductal dilatation or surrounding inflammatory changes. Spleen: Normal in size without focal abnormality. Adrenals/Urinary Tract: Adrenal glands are unremarkable. LEFT renal cyst. No hydronephrosis. Kidneys enhance symmetrically. Bladder is unremarkable. Stomach/Bowel: Moderate hiatal hernia. No evidence of bowel obstruction. Diverticulosis of the sigmoid and descending colon. No evidence of acute diverticulitis. Moderate colonic stool burden diffusely throughout the colon. Appendix is normal. Vascular/Lymphatic: Atherosclerotic calcifications of the nonaneurysmal aorta. No suspicious lymphadenopathy. Reproductive: Prostate is present. Other: Fat containing RIGHT inguinal hernia. No free air or free fluid. Musculoskeletal: Degenerative changes of the lumbar spine. IMPRESSION: 1. No CT etiology for acute abdominal pain identified. Aortic Atherosclerosis (ICD10-I70.0). Electronically Signed   By: Meda KlinefelterStephanie  Peacock MD   On: 09/09/2020 18:27    Scheduled Meds:  aspirin EC  81 mg Oral Daily   citalopram  20 mg Oral Daily   heparin  5,000 Units Subcutaneous Q8H   memantine  5 mg Oral BID   pantoprazole (PROTONIX) IV  40 mg Intravenous Q12H   vitamin B-12  1,000 mcg Oral Daily   Continuous Infusions:   LOS: 0 days   Time spent: 35 minutes   Hughie Clossavi Imya Mance, MD [facility]  09/10/2020, 12:35 PM   How to contact the [facility] Attending or Consulting provider 7A - 7P or covering provider during after hours 7P -7A, for this patient?  Check the care team in [facility] and look for a) attending/consulting [facility] provider  listed and b) the [facility] team listed. Page or secure chat 7A-7P. Log into [email or web address] and use [facility] universal password to access. If you do not have the password, please contact the hospital operator. Locate the [facility] provider you are looking for under [facility] and page to a number that you can be directly reached. If you still have difficulty reaching the provider, please page the [facility] (Director on Call) for the Hospitalists listed on amion for assistance.

## 2020-09-10 NOTE — ED Notes (Signed)
Patient had urinated on the bed and on the floor - the patient was provided pericare and full linen change, prima-fit applied, and a new brief.

## 2020-09-10 NOTE — ED Notes (Signed)
Patient inc of urine, patient cleaned, entire bed linen changed, patient placed in a hospital gown

## 2020-09-11 ENCOUNTER — Inpatient Hospital Stay: Payer: Medicare Other

## 2020-09-11 DIAGNOSIS — R413 Other amnesia: Secondary | ICD-10-CM

## 2020-09-11 DIAGNOSIS — I350 Nonrheumatic aortic (valve) stenosis: Secondary | ICD-10-CM | POA: Diagnosis not present

## 2020-09-11 DIAGNOSIS — R112 Nausea with vomiting, unspecified: Secondary | ICD-10-CM

## 2020-09-11 DIAGNOSIS — I639 Cerebral infarction, unspecified: Secondary | ICD-10-CM

## 2020-09-11 DIAGNOSIS — R531 Weakness: Secondary | ICD-10-CM | POA: Diagnosis not present

## 2020-09-11 DIAGNOSIS — F32A Depression, unspecified: Secondary | ICD-10-CM

## 2020-09-11 LAB — HEMOGLOBIN A1C
Hgb A1c MFr Bld: 6.4 % — ABNORMAL HIGH (ref 4.8–5.6)
Mean Plasma Glucose: 137 mg/dL

## 2020-09-11 LAB — HEMATOCRIT: HCT: 42.1 % (ref 39.0–52.0)

## 2020-09-11 MED ORDER — TECHNETIUM TO 99M ALBUMIN AGGREGATED
4.2500 | Freq: Once | INTRAVENOUS | Status: AC | PRN
  Administered 2020-09-11: 4.25 via INTRAVENOUS

## 2020-09-11 MED ORDER — ATORVASTATIN CALCIUM 20 MG PO TABS
40.0000 mg | ORAL_TABLET | Freq: Every day | ORAL | Status: DC
  Administered 2020-09-12 – 2020-09-15 (×4): 40 mg via ORAL
  Filled 2020-09-11 (×4): qty 2

## 2020-09-11 MED ORDER — IOHEXOL 350 MG/ML SOLN
75.0000 mL | Freq: Once | INTRAVENOUS | Status: AC | PRN
  Administered 2020-09-11: 75 mL via INTRAVENOUS

## 2020-09-11 MED ORDER — ENSURE ENLIVE PO LIQD
237.0000 mL | ORAL | Status: DC
  Administered 2020-09-11 – 2020-09-15 (×5): 237 mL via ORAL

## 2020-09-11 NOTE — Evaluation (Signed)
Occupational Therapy Evaluation Patient Details Name: Timothy Ramirez MRN: [id number] DOB: [birth year] Today's Date: 09/11/2020    History of Present Illness Pt is an 87 y/o M admitted on 09/09/20 with c/c of generalized weakness, AMS, & N&V. PMH: aortic stenosis, borderline DM, complete heart block, heart disease, HLD, HTN, alzheimer's dementia   Clinical Impression   Pt seen for OT evaluation this date. Upon arrival to room, pt sitting upright in bed, finishing breakfast, with son at bedside. Pt A&Ox2 and agreeable to OT eval. PLOF obtained via pt/family report. Prior to admission, pt was independent in functional mobility, dressing, and toileting. Pt required MIN A from daughter for standing sponge-bathing. At baseline, pt lives in a one story home with ramped entrance and 24/7 support from family. Pt currently presents with decreased strength, decreased balance, and decreased activity tolerance, and requires MIN A for bed mobility, SUPERVISION/SET-UP for bed-level LB dressing, MIN A for stand pivot transfers with RW, and SUPERVISION/SET-UP for seated grooming tasks. Pt c/o of dizziness throughout functional mobility and orthostatics obtained (see flowsheet). Pt would benefit from additional skilled OT services to maximize return to PLOF and minimize risk of future falls, injury, caregiver burden, and readmission. Upon discharge, recommend SNF.    Follow Up Recommendations  SNF    Equipment Recommendations  Other (comment) (defer to next venue of care)       Precautions / Restrictions Precautions Precautions: Fall Restrictions Weight Bearing Restrictions: No      Mobility Bed Mobility Overal bed mobility: Needs Assistance Bed Mobility: Supine to Sit     Supine to sit: Min assist;HOB elevated     General bed mobility comments: MIN A for trunk support    Transfers Overall transfer level: Needs assistance Equipment used: Rolling walker (2 wheeled) Transfers: Sit to/from  [employer] Sit to Stand: Min assist Stand pivot transfers: Min assist       General transfer comment: Requires verbal cues for safe hand placement with RW    Balance Overall balance assessment: Needs assistance Sitting-balance support: Feet supported;No upper extremity supported Sitting balance-Leahy Scale: Fair Sitting balance - Comments: Fair static sitting balance at EOB.   Standing balance support: Bilateral upper extremity supported;During functional activity Standing balance-Leahy Scale: Poor Standing balance comment: MIN A for b/l support on RW for stand pivot transfers                           ADL either performed or assessed with clinical judgement   ADL Overall ADL's : Needs assistance/impaired     Grooming: Wash/dry face;Supervision/safety;Set up;Sitting               Lower Body Dressing: Supervision/safety;Set up;Bed level Lower Body Dressing Details (indicate cue type and reason): to don socks             Functional mobility during ADLs: Minimal assistance;Rolling walker        Pertinent Vitals/Pain Pain Assessment: No/denies pain     Hand Dominance     Extremity/Trunk Assessment Upper Extremity Assessment Upper Extremity Assessment: Generalized weakness   Lower Extremity Assessment Lower Extremity Assessment: Generalized weakness       Communication Communication Communication: No difficulties   Cognition Arousal/Alertness: Awake/alert Behavior During Therapy: WFL for tasks assessed/performed Overall Cognitive Status: History of cognitive impairments - at baseline  General Comments: Alert and oriented to self and location. Disoriented to time and situation. Pleasant and agreeable throughout   General Comments  Pt c/o of dizziness throughout mobility. Orthostatics obtained: supine BP 147/80, HR 62, SpO2 94%; sitting BP 141/89; standing BP 154/94; pt unable to  stand for >3 mins; sitting BP 166/74, HR 65, SpO2 93%; sitting (after 3 mins) BP 139/76.            Home Living Family/patient expects to be discharged to:: Private residence Living Arrangements: Children;Other relatives Available Help at Discharge: Family;Available 24 hours/day (multiple family members rotate & provide 24 hr supervision, they have been providing this for the past year) Type of Home: House Home Access: Ramped entrance     Home Layout: One level     Bathroom Shower/Tub: Tub/shower unit         Home Equipment: Cane - single point;Tub bench          Prior Functioning/Environment Level of Independence: Needs assistance  Gait / Transfers Assistance Needed: Ambulatory without AD. Household ambulator. Pt had a major fall 4 months ago ADL's / Homemaking Assistance Needed: Ind with dressing and toileting. MIN A for spongebathing. Family assists with all cooking, cleaning, driving, appointments.            OT Problem List: Decreased strength;Decreased activity tolerance;Impaired balance (sitting and/or standing);Decreased cognition;Decreased knowledge of use of DME or AE      OT Treatment/Interventions: Self-care/ADL training;Therapeutic exercise;Energy conservation;DME and/or AE instruction;Therapeutic activities;Patient/family education;Balance training    OT Goals(Current goals can be found in the care plan section) Acute Rehab OT Goals Patient Stated Goal: go to rehab OT Goal Formulation: With family Time For Goal Achievement: 09/25/20 Potential to Achieve Goals: Good ADL Goals Pt Will Perform Upper Body Bathing: with min guard assist;standing Pt Will Transfer to Toilet: with min assist;ambulating;regular height toilet Pt Will Perform Toileting - Clothing Manipulation and hygiene: with min assist;sit to/from stand  OT Frequency: Min 1X/week    AM-PAC OT "6 Clicks" Daily Activity     Outcome Measure Help from another person eating meals?: None Help  from another person taking care of personal grooming?: A Little Help from another person toileting, which includes using toliet, bedpan, or urinal?: A Lot Help from another person bathing (including washing, rinsing, drying)?: A Lot Help from another person to put on and taking off regular upper body clothing?: A Little Help from another person to put on and taking off regular lower body clothing?: A Little 6 Click Score: 17   End of Session Equipment Utilized During Treatment: Gait belt;Rolling walker Nurse Communication: Mobility status  Activity Tolerance: Patient tolerated treatment well Patient left: in chair;with call bell/phone within reach;with chair alarm set;with family/visitor present  OT Visit Diagnosis: Muscle weakness (generalized) (M62.81)                Time: 4098-1191 OT Time Calculation (min): 29 min Charges:  OT General Charges $OT Visit: 1 Visit OT Evaluation $OT Eval Moderate Complexity: 1 Mod OT Treatments $Therapeutic Activity: 8-22 mins  Matthew Folks, OTR/L [facility] [phone]

## 2020-09-11 NOTE — [place] FL2 (Signed)
[place] MEDICAID FL2 LEVEL OF CARE SCREENING TOOL     IDENTIFICATION  Patient Name: Timothy Ramirez Birthdate: 08-06-1933 Sex: male Admission Date (Current Location): 09/09/2020  [facility] and [place] Number:  Chiropodist and Address:  [facility], [address] [place], [place] [address]      Provider Number: [id number]  Attending Physician Name and Address:  Alford Highland, MD  Relative Name and Phone Number:  Gaye Pollack Daughger [phone]    Current Level of Care: Hospital Recommended Level of Care: Skilled Nursing Facility Prior Approval Number:    Date Approved/Denied:   PASRR Number: [id number]  Discharge Plan: SNF    Current Diagnoses: Patient Active Problem List   Diagnosis Date Noted   Gastroenteritis 09/10/2020   Generalized weakness 09/09/2020   Nausea & vomiting 09/09/2020   Complete heart block (HCC) 07/12/2020   DNR (do not resuscitate) 07/12/2020   Heart block AV third degree (HCC) 07/11/2020   Dementia without behavioral disturbance (HCC)    Heart block 05/05/2020   CAD in native artery 05/05/2020   AV block, 2nd degree 12/24/2019   Bradycardia 11/08/2019   Constipation 11/08/2019   Elevated troponin 11/08/2019   Diabetes mellitus type 2, uncomplicated (HCC) 11/08/2019   Microhematuria 08/04/2019   BPH without obstruction/lower urinary tract symptoms 08/04/2019   Atypical chest pain 05/20/2018   Shortness of breath 05/20/2018   Aortic valve stenosis 05/20/2018   Nonrheumatic mitral valve regurgitation 05/20/2018   Essential hypertension 05/20/2018   Mixed hyperlipidemia 05/20/2018    Orientation RESPIRATION BLADDER Height & Weight     Self, Place  Normal External catheter Weight: 90.3 kg Height:  5\' 9"  (175.3 cm)  BEHAVIORAL SYMPTOMS/MOOD NEUROLOGICAL BOWEL NUTRITION STATUS      Continent Diet (carb modified heart healthy)  AMBULATORY STATUS COMMUNICATION OF NEEDS Skin   Extensive Assist   Normal                        Personal Care Assistance Level of Assistance  Bathing, Feeding, Dressing Bathing Assistance: Limited assistance Feeding assistance: Limited assistance Dressing Assistance: Limited assistance     Functional Limitations Info  Hearing   Hearing Info: Impaired      SPECIAL CARE FACTORS FREQUENCY  PT (By licensed PT), OT (By licensed OT)     PT Frequency: 5 times per week OT Frequency: 5 times per week            Contractures Contractures Info: Not present    Additional Factors Info  Code Status, Allergies Code Status Info: full code Allergies Info: NKDA           Current Medications (09/11/2020):  This is the current hospital active medication list Current Facility-Administered Medications  Medication Dose Route Frequency Provider Last Rate Last Admin   acetaminophen (TYLENOL) tablet 650 mg  650 mg Oral Q6H PRN 09/13/2020, MD   650 mg at 09/10/20 1750   amLODipine (NORVASC) tablet 5 mg  5 mg Oral Daily 09/12/20, MD   5 mg at 09/11/20 0950   aspirin EC tablet 81 mg  81 mg Oral Daily 09/13/20, MD   81 mg at 09/11/20 0950   atorvastatin (LIPITOR) tablet 20 mg  20 mg Oral Daily 09/13/20, MD   20 mg at 09/11/20 0950   citalopram (CELEXA) tablet 20 mg  20 mg Oral Daily 09/13/20, MD   20 mg at 09/11/20 0951   heparin  injection 5,000 Units  5,000 Units Subcutaneous Q8H Irena Cords V, MD   5,000 Units at 09/11/20 0516   hydrALAZINE (APRESOLINE) injection 10 mg  10 mg Intravenous Q6H PRN Gertha Calkin, MD   10 mg at 09/10/20 1233   memantine (NAMENDA) tablet 5 mg  5 mg Oral BID Gertha Calkin, MD   5 mg at 09/11/20 0950   ondansetron (ZOFRAN) injection 4 mg  4 mg Intravenous Q6H PRN Hughie Closs, MD   4 mg at 09/10/20 1645   pantoprazole (PROTONIX) injection 40 mg  40 mg Intravenous Q12H Gertha Calkin, MD   40 mg at 09/11/20 0950   vitamin B-12 (CYANOCOBALAMIN) tablet 1,000 mcg  1,000 mcg Oral Daily Irena Cords V, MD   1,000 mcg at  09/11/20 0950   zolpidem (AMBIEN) tablet 5 mg  5 mg Oral QHS PRN Andris Baumann, MD   5 mg at 09/10/20 2236     Discharge Medications: Please see discharge summary for a list of discharge medications.  Relevant Imaging Results:  Relevant Lab Results:   Additional Information SS3 [id number]  Barrie Dunker, RN

## 2020-09-11 NOTE — Progress Notes (Signed)
Initial Nutrition Assessment  DOCUMENTATION CODES:  Not applicable  INTERVENTION:  Liberalize diet to Carb Modified due to advanced age and decreased PO intake Ensure Enlive po 1x/d, each supplement provides 350 kcal and 20 grams of protein  NUTRITION DIAGNOSIS:  Inadequate oral intake related to decreased appetite as evidenced by per patient/family report.  GOAL:  Patient will meet greater than or equal to 90% of their needs  MONITOR:  PO intake, Supplement acceptance, Weight trends  REASON FOR ASSESSMENT:  Malnutrition Screening Tool    ASSESSMENT:  Pt presented to ED with acute weakness, N/V, and AMS. Found to be dehydrated with workuo suggestive of enteritis. PMH of dementia, DM type 2, HTN, HLD, and CAD.  Pt resting in bedside chair at the time of assessment. Reports that appetite is not very good, but that it is about at baseline. Reports eating 3x/d at baseline, but portions are small. Denies weight changes and feels he is stable at about 195 lbs. Reports he occasionally drinks ensure.   Will recommend diet liberalization and add light nutrition supplementation to augment intake. Noted pt from home, but will dc to SNF. CM working on placement.   Average Meal Intake: 7/23-7/25: 80% intake x 1 recorded meal  Nutritionally Relevant Medications: Scheduled Meds:  atorvastatin  20 mg Oral Daily   memantine  5 mg Oral BID   pantoprazole (PROTONIX) IV  40 mg Intravenous Q12H   vitamin B-12  1,000 mcg Oral Daily   PRN Meds: ondansetron  Labs Reviewed  NUTRITION - FOCUSED PHYSICAL EXAM: Flowsheet Row Most Recent Value  Orbital Region No depletion  Upper Arm Region No depletion  Thoracic and Lumbar Region No depletion  Buccal Region No depletion  Temple Region No depletion  Clavicle Bone Region No depletion  Clavicle and Acromion Bone Region No depletion  Scapular Bone Region No depletion  Dorsal Hand No depletion  Patellar Region No depletion  Anterior Thigh Region  No depletion  Posterior Calf Region No depletion  Edema (RD Assessment) None  Hair Reviewed  Eyes Reviewed  Mouth Reviewed  Skin Reviewed  Nails Reviewed   Diet Order:   Diet Order             Diet Carb Modified Fluid consistency: Thin; Room service appropriate? Yes with Assist  Diet effective now                   EDUCATION NEEDS:  No education needs have been identified at this time  Skin:  Skin Assessment: Reviewed RN Assessment  Last BM:  7/24  Height:  Ht Readings from Last 1 Encounters:  09/09/20 5\' 9"  (1.753 m)    Weight:  Wt Readings from Last 1 Encounters:  09/09/20 90.3 kg    Ideal Body Weight:  72.7 kg  BMI:  Body mass index is 29.39 kg/m.  Estimated Nutritional Needs:  Kcal:  1800-2000 kcal/d Protein:  90-100 g/d Fluid:  >2L/d   09/11/20, RD, LDN Clinical Dietitian Pager on Amion

## 2020-09-11 NOTE — Progress Notes (Addendum)
Patient ID: Timothy Ramirez, male   DOB: January 09, 1934, 87 y.o.   MRN: [id number] [facility] PROGRESS NOTE  Timothy CooperRobert M Timothy Ramirez DOB: January 09, 1934 DOA: 09/09/2020 PCP: Jerl MinaHedrick, James, MD  HPI/Subjective: Patient feels weak.  Poor appetite.  Initially admitted with nausea vomiting.  Feels like he is little weaker on his right side.  Objective: Vitals:   09/11/20 0908 09/11/20 1634  BP: 139/76 (!) 148/85  Pulse:  65  Resp:  17  Temp:  97.8 F (36.6 C)  SpO2:  92%    Intake/Output Summary (Last 24 hours) at 09/11/2020 1652 Last data filed at 09/11/2020 1100 Gross per 24 hour  Intake 120 ml  Output 950 ml  Net -830 ml   Filed Weights   09/09/20 1534  Weight: 90.3 kg    ROS: Review of Systems  Respiratory:  Negative for shortness of breath.   Cardiovascular:  Negative for chest pain.  Gastrointestinal:  Negative for abdominal pain, nausea and vomiting.  Neurological:  Positive for weakness.  Exam: Physical Exam HENT:     Head: Normocephalic.     Mouth/Throat:     Pharynx: No oropharyngeal exudate.  Eyes:     General: Lids are normal.     Conjunctiva/sclera: Conjunctivae normal.     Pupils: Pupils are equal, round, and reactive to light.  Cardiovascular:     Rate and Rhythm: Normal rate and regular rhythm.     Heart sounds: Normal heart sounds, S1 normal and S2 normal.  Pulmonary:     Breath sounds: Normal breath sounds. No decreased breath sounds, wheezing, rhonchi or rales.  Abdominal:     Palpations: Abdomen is soft.     Tenderness: There is no abdominal tenderness.  Musculoskeletal:     Right lower leg: No swelling.     Left lower leg: No swelling.  Skin:    General: Skin is warm.     Findings: No rash.  Neurological:     Mental Status: He is alert and oriented to person, place, and time.     Comments: Left finger-nose impaired, right finger-nose normal.  Power 5 out of 5 upper and lower extremities.  Rapid finger movements impaired bilateral hands.      Data Reviewed: Basic Metabolic Panel: Recent Labs  Lab 09/09/20 1548 09/09/20 2130 09/10/20 0651  NA 137  --  138  K 4.1  --  3.7  CL 106  --  108  CO2 20*  --  24  GLUCOSE 184*  --  131*  BUN 24*  --  21  CREATININE 1.30*  --  1.04  CALCIUM 9.3  --  8.6*  MG  --  1.9  --    Liver Function Tests: Recent Labs  Lab 09/09/20 1548 09/10/20 0651  AST 24 18  ALT 22 16  ALKPHOS 89 77  BILITOT 2.1* 2.1*  PROT 7.2 5.9*  ALBUMIN 3.8 3.3*   Recent Labs  Lab 09/09/20 2130  LIPASE 19    CBC: Recent Labs  Lab 09/09/20 1548 09/10/20 0651  WBC 11.6* 9.5  HGB 17.1* 15.2  HCT 47.8 43.2  MCV 87.9 87.6  PLT 142* 128*   Cardiac Enzymes: Recent Labs  Lab 09/09/20 2130  CKTOTAL 55   BNP (last 3 results) Recent Labs    09/09/20 2130  BNP 209.4*     Recent Results (from the past 240 hour(s))  Resp Panel by RT-PCR (Flu A&B, Covid) Nasopharyngeal Swab     Status: None  Collection Time: 09/09/20  5:53 PM   Specimen: Nasopharyngeal Swab; Nasopharyngeal(NP) swabs in vial transport medium  Result Value Ref Range Status   SARS Coronavirus 2 by RT PCR NEGATIVE NEGATIVE Final    Comment: (NOTE) SARS-CoV-2 target nucleic acids are NOT DETECTED.  The SARS-CoV-2 RNA is generally detectable in upper respiratory specimens during the acute phase of infection. The lowest concentration of SARS-CoV-2 viral copies this assay can detect is 138 copies/mL. A negative result does not preclude SARS-Cov-2 infection and should not be used as the sole basis for treatment or other patient management decisions. A negative result may occur with  improper specimen collection/handling, submission of specimen other than nasopharyngeal swab, presence of viral mutation(s) within the areas targeted by this assay, and inadequate number of viral copies(<138 copies/mL). A negative result must be combined with clinical observations, patient history, and epidemiological information. The  expected result is Negative.  Fact Sheet for Patients:  BloggerCourse.com  Fact Sheet for Healthcare Providers:  SeriousBroker.it  This test is no t yet approved or cleared by the [place] FDA and  has been authorized for detection and/or diagnosis of SARS-CoV-2 by FDA under an Emergency Use Authorization (EUA). This EUA will remain  in effect (meaning this test can be used) for the duration of the COVID-19 declaration under Section 564(b)(1) of the Act, 21 U.S.C.section 360bbb-3(b)(1), unless the authorization is terminated  or revoked sooner.       Influenza A by PCR NEGATIVE NEGATIVE Final   Influenza B by PCR NEGATIVE NEGATIVE Final    Comment: (NOTE) The Xpert Xpress SARS-CoV-2/FLU/RSV plus assay is intended as an aid in the diagnosis of influenza from Nasopharyngeal swab specimens and should not be used as a sole basis for treatment. Nasal washings and aspirates are unacceptable for Xpert Xpress SARS-CoV-2/FLU/RSV testing.  Fact Sheet for Patients: BloggerCourse.com  Fact Sheet for Healthcare Providers: SeriousBroker.it  This test is not yet approved or cleared by the [place] FDA and has been authorized for detection and/or diagnosis of SARS-CoV-2 by FDA under an Emergency Use Authorization (EUA). This EUA will remain in effect (meaning this test can be used) for the duration of the COVID-19 declaration under Section 564(b)(1) of the Act, 21 U.S.C. section 360bbb-3(b)(1), unless the authorization is terminated or revoked.  Performed at [facility], [address] [place], [place] [address]      Studies: DG Chest 2 View  Result Date: 09/11/2020 CLINICAL DATA:  87 year old male with a history shortness of breath and hypertension EXAM: CHEST - 2 VIEW COMPARISON:  09/09/2020 FINDINGS: Cardiomediastinal silhouette unchanged in size and contour.  Improved inspiration and lung volumes, with persisting linear opacities at the bilateral lung bases. No pleural effusion pneumothorax or new confluent airspace disease. No interlobular septal thickening. No central vascular congestion. Cardiac pacer on the left chest wall. Degenerative changes of the spine.  No acute displaced fracture IMPRESSION: Improved inspiratory effort, with likely background of chronic changes and linear atelectasis. Unchanged cardiac pacing device Electronically Signed   By: Gilmer Mor D.O.   On: 09/11/2020 08:06   DG Chest 2 View  Result Date: 09/09/2020 CLINICAL DATA:  Vomiting and lethargy. EXAM: CHEST - 2 VIEW COMPARISON:  Chest radiograph dated 07/12/2020. FINDINGS: The heart size is normal. Vascular calcifications are seen in the aortic arch. The lung volumes are low. There is mild bibasilar atelectasis. There is no pleural effusion or pneumothorax. Degenerative changes are seen in the spine. A left subclavian approach cardiac device  is redemonstrated. IMPRESSION: Low lung volumes and mild bibasilar atelectasis. Aortic Atherosclerosis (ICD10-I70.0). Electronically Signed   By: Romona Curls M.D.   On: 09/09/2020 19:06   CT HEAD WO CONTRAST  Result Date: 09/11/2020 CLINICAL DATA:  Nausea, vomiting, neurological deficit, acute, stroke suspected. EXAM: CT HEAD WITHOUT CONTRAST TECHNIQUE: Contiguous axial images were obtained from the base of the skull through the vertex without intravenous contrast. COMPARISON:  09/09/2020.  MRI 06/06/2020. FINDINGS: Brain: There is subacute infarction within the cerebellum on the left measuring about 3.5 cm in size. Since the previous study, there is some internal petechial bleeding. Mild mass-effect upon the fourth ventricle but no ventricular obstruction Old small vessel infarctions in the right cerebellum are unchanged. Old peripheral left cerebellar infarction is unchanged. Old small vessel infarctions in the right thalamus are unchanged.  Chronic small-vessel ischemic change of the cerebral hemispheric white matter are unchanged. No hydrocephalus.  No extra-axial collection. Vascular: There is atherosclerotic calcification of the major vessels at the base of the brain. Skull: Negative Sinuses/Orbits: Clear/normal Other: None IMPRESSION: Subacute infarction in the left cerebellum measuring about 3.5 cm in size. Since the previous study, petechial blood products are present in this area now. No discrete hematoma however. Mild mass-effect upon the fourth ventricle but no ventricular obstruction. No other acute intracranial finding. Old infarctions in the cerebellum, right thalamus and cerebral hemispheric white matter. Electronically Signed   By: Paulina Fusi M.D.   On: 09/11/2020 15:43   CT Head Wo Contrast  Result Date: 09/09/2020 CLINICAL DATA:  Mental status change, unknown cause EXAM: CT HEAD WITHOUT CONTRAST TECHNIQUE: Contiguous axial images were obtained from the base of the skull through the vertex without intravenous contrast. COMPARISON:  Jun 22, 2009; June 06, 2020 FINDINGS: Brain: No evidence of acute infarction, hemorrhage, hydrocephalus, extra-axial collection or mass lesion/mass effect. Remote LEFT cerebellar infarction. Periventricular white matter hypodensities consistent with sequela of chronic microvascular ischemic disease. Global parenchymal volume loss Vascular: Vascular calcifications. Skull: Normal. Negative for fracture or focal lesion. Sinuses/Orbits: No acute finding. Other: None. IMPRESSION: No acute intracranial abnormality. Electronically Signed   By: Meda Klinefelter MD   On: 09/09/2020 18:19   NM Pulmonary Perfusion  Result Date: 09/11/2020 CLINICAL DATA:  PE suspected, high probability; altered mental status/positive D-dimer. EXAM: NUCLEAR MEDICINE PERFUSION LUNG SCAN TECHNIQUE: Perfusion images were obtained in multiple projections after intravenous injection of radiopharmaceutical. Ventilation scans  intentionally deferred if perfusion scan and chest x-ray adequate for interpretation during COVID 19 epidemic. RADIOPHARMACEUTICALS:  4.25 mCi Tc-82m MAA IV COMPARISON:  Chest x-ray today FINDINGS: No wedge-shaped perfusion defects seen to suggest pulmonary embolus. IMPRESSION: No evidence of pulmonary embolus. Electronically Signed   By: Charlett Nose M.D.   On: 09/11/2020 12:15   CT ABDOMEN PELVIS W CONTRAST  Result Date: 09/09/2020 CLINICAL DATA:  Nausea/vomiting Bowel obstruction suspected EXAM: CT ABDOMEN AND PELVIS WITH CONTRAST TECHNIQUE: Multidetector CT imaging of the abdomen and pelvis was performed using the standard protocol following bolus administration of intravenous contrast. CONTRAST:  OMNIPAQUE IOHEXOL 350 MG/ML SOLN COMPARISON:  November 08, 2019 FINDINGS: Lower chest: Bibasilar atelectasis.  Unchanged cardiomegaly. Hepatobiliary: Status post cholecystectomy. Unchanged dilation of the common bile duct to approximately 11 mm, likely due to post cholecystectomy state. Mild intrahepatic biliary prominence, unchanged. No new hepatic focal lesion. Portal vein is patent. Pancreas: Atrophic in appearance. No pancreatic ductal dilatation or surrounding inflammatory changes. Spleen: Normal in size without focal abnormality. Adrenals/Urinary Tract: Adrenal glands are unremarkable. LEFT renal  cyst. No hydronephrosis. Kidneys enhance symmetrically. Bladder is unremarkable. Stomach/Bowel: Moderate hiatal hernia. No evidence of bowel obstruction. Diverticulosis of the sigmoid and descending colon. No evidence of acute diverticulitis. Moderate colonic stool burden diffusely throughout the colon. Appendix is normal. Vascular/Lymphatic: Atherosclerotic calcifications of the nonaneurysmal aorta. No suspicious lymphadenopathy. Reproductive: Prostate is present. Other: Fat containing RIGHT inguinal hernia. No free air or free fluid. Musculoskeletal: Degenerative changes of the lumbar spine. IMPRESSION: 1.  No CT etiology for acute abdominal pain identified. Aortic Atherosclerosis (ICD10-I70.0). Electronically Signed   By: Meda Klinefelter MD   On: 09/09/2020 18:27    Scheduled Meds:  amLODipine  5 mg Oral Daily   aspirin EC  81 mg Oral Daily   atorvastatin  20 mg Oral Daily   citalopram  20 mg Oral Daily   feeding supplement  237 mL Oral Q24H   heparin  5,000 Units Subcutaneous Q8H   memantine  5 mg Oral BID   pantoprazole (PROTONIX) IV  40 mg Intravenous Q12H   vitamin B-12  1,000 mcg Oral Daily    Assessment/Plan:  Subacute stroke left cerebellum.  Impaired finger-nose left hand.  Patient states he is little weaker on the right side.  Neurology consult.  Currently on aspirin.  Repeat CT scan shows subacute infarction left cerebellum 3.5 cm in size, some petechial blood products are present in this area now.  Neurology consultation.  Telemetry monitoring showing AV paced with PVCs.  Will order echocardiogram and carotid ultrasound.  Continue increased dose of atorvastatin.  Case discussed with neurology and will hold aspirin and heparin subcu.  Close monitoring needed. Weakness.  Physical therapy recommending rehab Nausea vomiting and poor appetite likely secondary to stroke. Acute kidney injury and dehydration improved with initial fluids History of moderate aortic stenosis Depression on Celexa Memory loss on Namenda B12 deficiency Essential hypertension on Norvasc        Code Status:     Code Status Orders  (From admission, onward)           Start     Ordered   09/09/20 2119  Full code  Continuous        09/09/20 2118           Code Status History     Date Active Date Inactive Code Status Order ID Comments User Context   07/12/2020 1556 07/12/2020 2327 DNR [id number]  Erick Blinks, MD Inpatient   07/11/2020 1221 07/12/2020 1555 Full Code [id number]  Enedina Finner, MD ED   02/03/2020 1047 02/03/2020 1843 Full Code [id number]  Yvonne Kendall, MD Inpatient    11/08/2019 0351 11/09/2019 2032 Full Code [id number]  Andris Baumann, MD ED      Advance Directive Documentation    Flowsheet Row Most Recent Value  Type of Advance Directive Healthcare Power of Attorney, Living will  Pre-existing out of facility DNR order (yellow form or pink MOST form) --  "MOST" Form in Place? --      Family Communication: Spoke with son on the phone this afternoon and at the bedside this morning Disposition Plan: Status is: Inpatient  Dispo: The patient is from: Home              Anticipated d/c is to: Rehab              Patient currently just diagnosed with subacute stroke which likely happened on presentation.  Neurology consultation and complete stroke work-up.   Difficult to place patient.  No  Consultants: We  will get neurology consultation  Time spent: 28 minutes  Amandalee [employer]

## 2020-09-11 NOTE — Plan of Care (Signed)
This is an 87 year old gentleman who presented with acute onset nausea, vomiting, dizziness, with initial head CT read as negative follow-up CT demonstrating hemorrhagic conversion of a subacute left cerebellar stroke.  MRI cannot be obtained secondary to pacemaker  Given the patient's overall clinical stability and petechial secondary hemorrhagic conversion of an ischemic stroke, patient does not need emergent transfer to [facility] (which would be undertaken for primary ICH).  He may remain at [facility] for full stroke work-up but in an abundance of caution the case was discussed with Dr. Wilford Corner in case of decline, who is the neuro hospitalist on-call at [facility] and aware of the patient should need for emergent transfer arise  Brief plan: Hold antiplatelet agents and DVT prophylaxis overnight Obtain repeat head CT to confirm stability  CTA head and neck to complete stroke work-up BP goal < 160, titration of agents as needed per primary team Full consultation to follow on 7/26  Brooke Dare MD-PhD [facility] [phone]  [facility] coverage for [facility] is from 8 AM to 4 AM in-house and 4 PM to 8 PM by telephone/video. 8 PM to 8 AM emergent questions or overnight urgent questions should be addressed to Teleneurology On-call or [facility] neurohospitalist; contact information can be found on AMION

## 2020-09-11 NOTE — TOC Progression Note (Signed)
Transition of Care [facility]) - Progression Note    Patient Details  Name: PHI AVANS MRN: [id number] Date of Birth: 10-27-1933  [facility]) CM/SW Contact  Barrie Dunker, RN Phone Number: 09/11/2020, 2:16 PM  Clinical Narrative:    Reviewed the bed offers with the patient's son, he called his sister and reviewed, they made the choice of [facility], I notified Tonya at [facility], awaiting DC        Expected Discharge Plan and Services                                                 Social Determinants of Health (SDOH) Interventions    Readmission Risk Interventions No flowsheet data found.

## 2020-09-11 NOTE — TOC Progression Note (Signed)
Transition of Care [facility]) - Progression Note    Patient Details  Name: Timothy Ramirez MRN: [id number] Date of Birth: 25-Jun-1933  Transition of Care [facility]) CM/SW [place], RN Phone Number: 09/11/2020, 10:04 AM  Clinical Narrative:     Met with the patient's son at the bedside, he is agreeable to a [place] for STR, the patient does have long term insurance in the event he needs a long term bed, bed search sent, will review bed offers once obtained       Expected Discharge Plan and Services                                                 Social Determinants of Health (SDOH) Interventions    Readmission Risk Interventions No flowsheet data found.

## 2020-09-11 NOTE — Progress Notes (Signed)
Physical Therapy Treatment Patient Details Name: Timothy Ramirez MRN: [id number] DOB: 1933-12-04 Today's Date: 09/11/2020    History of Present Illness Pt is an 87 y/o M admitted on 09/09/20 with c/c of generalized weakness, AMS, & N&V. PMH: aortic stenosis, borderline DM, complete heart block, heart disease, HLD, HTN, alzheimer's dementia    PT Comments    Pt seen for PT tx with son present. Pt requires encouragement to complete bed mobility without assistance but requires use of bed rails & HOB elevated with supervision on 1st attempt, min assist on 2nd. Pt attempts sit>stand but endorses dizziness & elects to return supine. BP 159/84 mmHg in RUE (MAP 106). Pt encouraged to transfer to recliner & does so with mod assist stand pivot with UE HHA. Pt endorses dizziness when turning head to L but no nystagmus observed during session. Recommend vestibular screen.     Follow Up Recommendations  SNF;Supervision/Assistance - 24 hour     Equipment Recommendations  None recommended by PT (TBD in next venue)    Recommendations for Other Services       Precautions / Restrictions Precautions Precautions: Fall Restrictions Weight Bearing Restrictions: No    Mobility  Bed Mobility Overal bed mobility: Needs Assistance Bed Mobility: Supine to Sit     Supine to sit: Supervision;HOB elevated;Min assist Sit to supine: Supervision;HOB elevated   General bed mobility comments: assistance to upright trunk    Transfers Overall transfer level: Needs assistance Equipment used: 1 person hand held assist Transfers: Sit to/from [employer] Sit to Stand: Mod assist Stand pivot transfers: Mod assist       General transfer comment: cuing for hand placement, assistance for turning  Ambulation/Gait                 Stairs             Wheelchair Mobility    Modified Rankin (Stroke Patients Only)       Balance Overall balance assessment: Needs  assistance Sitting-balance support: Feet supported;No upper extremity supported Sitting balance-Leahy Scale: Fair     Standing balance support: During functional activity;Single extremity supported Standing balance-Leahy Scale: Poor                              Cognition Arousal/Alertness: Awake/alert Behavior During Therapy: WFL for tasks assessed/performed Overall Cognitive Status: History of cognitive impairments - at baseline                                 General Comments: hx of dementia      Exercises General Exercises - Lower Extremity Straight Leg Raises: AROM;Strengthening;Both;10 reps    General Comments        Pertinent Vitals/Pain Pain Assessment: No/denies pain    Home Living                      Prior Function            PT Goals (current goals can now be found in the care plan section) Acute Rehab PT Goals Patient Stated Goal: go to rehab PT Goal Formulation: With family Time For Goal Achievement: 09/24/20 Potential to Achieve Goals: Good Progress towards PT goals: Progressing toward goals    Frequency    Min 2X/week      PT Plan Current plan remains appropriate    Co-evaluation  AM-PAC PT "6 Clicks" Mobility   Outcome Measure  Help needed turning from your back to your side while in a flat bed without using bedrails?: A Little Help needed moving from lying on your back to sitting on the side of a flat bed without using bedrails?: A Little Help needed moving to and from a bed to a chair (including a wheelchair)?: A Lot Help needed standing up from a chair using your arms (e.g., wheelchair or bedside chair)?: A Lot Help needed to walk in hospital room?: Total Help needed climbing 3-5 steps with a railing? : Total 6 Click Score: 12    End of Session Equipment Utilized During Treatment: Gait belt Activity Tolerance:  (limited by dizziness) Patient left: in chair;with chair alarm  set;with family/visitor present;with call bell/phone within reach   PT Visit Diagnosis: Unsteadiness on feet (R26.81);Muscle weakness (generalized) (M62.81);Difficulty in walking, not elsewhere classified (R26.2)     Time: 5035-4656 PT Time Calculation (min) (ACUTE ONLY): 12 min  Charges:  $Therapeutic Activity: 8-22 mins                     Aleda Grana, PT, DPT 09/11/20, 2:24 PM    Sandi Mariscal 09/11/2020, 2:22 PM

## 2020-09-11 NOTE — Consult Note (Signed)
Neurology Consultation Reason for Consult: Cerebellar stroke Requesting Physician: Fidela Juneau  CC: Ataxia  History is obtained from: Chart review given patient's dementia, was unable to reach family today  HPI: Timothy Ramirez is a 87 y.o. male with past medical history significant for Alzheimer's dementia, prior heavy alcohol use, borderline diabetes, coronary artery disease, hypertension, hyperlipidemia, obesity (BMI 29.39), complete heart block s/p Medtronic pacemaker (07/12/2020), and macular edema  He follows with Dr. Sherryll Burger on an outpatient basis who notes that in 2020 he began to have memory impairments and at baseline no longer manages his own finances and is no longer driving.  He occasionally also has some baseline bowel incontinence intermittently has poor hygiene, sleeps a lot and has occasional day night confusion requiring 24-hour supervision, as well as help with hygiene.  At baseline he is not oriented to time and is slow unsteady gait.  He was last seen on 08/01/2020  LKW: Unclear, symptom onset at 8:30 AM on 7/23 tPA given?: No, presented outside of the time window and CT demonstrated hypodensity Premorbid modified rankin scale: 3-4     3 - Moderate disability. Requires some help, but able to walk unassisted.     4 - Moderately severe disability. Unable to attend to own bodily needs without assistance, and unable to walk unassisted.  ROS: Unable to obtain due to altered mental status.   Past Medical History:  Diagnosis Date   Aortic stenosis    Moderate (mean gradient 37, AVA 1.1 by cath in 01/2020)   Borderline diabetes    Complete heart block (HCC) 06/2020   s/p Medtronic PPM 07/12/2020   Coronary artery disease 01/2020   Mild, nonobstructive dz by cath   Hyperlipidemia    Hypertension    Past Surgical History:  Procedure Laterality Date   APPENDECTOMY     HERNIA REPAIR     PACEMAKER IMPLANT N/A 07/12/2020   Procedure: PACEMAKER IMPLANT;  Surgeon: Lanier Prude, MD;  Location: [facility] INVASIVE CV LAB;  Service: Cardiovascular;  Laterality: N/A;   RIGHT/LEFT HEART CATH AND CORONARY ANGIOGRAPHY N/A 02/03/2020   Procedure: RIGHT/LEFT HEART CATH AND CORONARY ANGIOGRAPHY;  Surgeon: Yvonne Kendall, MD;  Location: [facility] INVASIVE CV LAB;  Service: Cardiovascular;  Laterality: N/A;   TONSILLECTOMY     Current Outpatient Medications  Medication Instructions   aspirin EC 81 mg, Oral, Daily, Swallow whole.   atorvastatin (LIPITOR) 20 mg, Oral, Daily   Cholecalciferol (D3-1000 PO) 1 tablet, Oral, Daily   citalopram (CELEXA) 20 mg, Oral, Daily   memantine (NAMENDA) 5 mg, Oral, 2 times daily   temazepam (RESTORIL) 30 mg, Oral, Daily at bedtime   vitamin B-12 (CYANOCOBALAMIN) 1,000 mcg, Oral, Daily     Current Facility-Administered Medications:    acetaminophen (TYLENOL) tablet 650 mg, 650 mg, Oral, Q6H PRN, Pahwani, Ravi, MD, 650 mg at 09/10/20 1750   amLODipine (NORVASC) tablet 5 mg, 5 mg, Oral, Daily, Pahwani, Ravi, MD, 5 mg at 09/12/20 1058   atorvastatin (LIPITOR) tablet 40 mg, 40 mg, Oral, Daily, Wieting, Richard, MD, 40 mg at 09/12/20 1058   citalopram (CELEXA) tablet 20 mg, 20 mg, Oral, Daily, Irena Cords V, MD, 20 mg at 09/12/20 1058   feeding supplement (ENSURE ENLIVE / ENSURE PLUS) liquid 237 mL, 237 mL, Oral, Q24H, Wieting, Richard, MD, 237 mL at 09/11/20 1641   memantine (NAMENDA) tablet 5 mg, 5 mg, Oral, BID, Irena Cords V, MD, 5 mg at 09/12/20 1058   metoprolol succinate (TOPROL-XL) 24 hr  tablet 25 mg, 25 mg, Oral, Daily, Wieting, Richard, MD   ondansetron [facility]) injection 4 mg, 4 mg, Intravenous, Q6H PRN, Jacqulyn Bath, Ravi, MD, 4 mg at 09/10/20 1645   pantoprazole (PROTONIX) injection 40 mg, 40 mg, Intravenous, Q12H, Irena Cords V, MD, 40 mg at 09/12/20 1057   temazepam (RESTORIL) capsule 15 mg, 15 mg, Oral, QHS PRN, Renae Gloss, Richard, MD   vitamin B-12 (CYANOCOBALAMIN) tablet 1,000 mcg, 1,000 mcg, Oral, Daily, Gertha Calkin, MD, 1,000 mcg at  09/12/20 1059   Family History  Problem Relation Age of Onset   Congestive Heart Failure Mother    Heart attack Mother 58   Cirrhosis Father    COPD Sister    Social History:  reports that he has never smoked. He has never used smokeless tobacco. He reports previous alcohol use of about 2.0 standard drinks of alcohol per week. He reports that he does not use drugs.  Exam: Current vital signs: BP (!) 148/85 (BP Location: Right Arm)   Pulse 65   Temp 97.8 F (36.6 C)   Resp 17   Ht 5\' 9"  (1.753 m)   Wt 90.3 kg   SpO2 92%   BMI 29.39 kg/m  Vital signs in last 24 hours: Temp:  [97.8 F (36.6 C)-98.7 F (37.1 C)] 97.8 F (36.6 C) (07/25 1634) Pulse Rate:  [61-67] 65 (07/25 1634) Resp:  [17-20] 17 (07/25 1634) BP: (127-166)/(62-88) 148/85 (07/25 1634) SpO2:  [92 %-96 %] 92 % (07/25 1634)   Physical Exam  Constitutional: Appears well-developed and well-nourished.  Psych: Calm and cooperative Eyes: No scleral injection HENT: No oropharyngeal obstruction.  MSK: no joint deformities.  Cardiovascular: Normal rate and regular rhythm.  Respiratory: Effort normal, non-labored breathing GI: Soft.  No distension. There is no tenderness.  Skin: Warm dry and intact visible skin  Neuro: Mental Status: Patient is awake, alert, oriented to person but not place, situation, or time Patient is able to give very limited history given his baseline dementia.  He has some mild aphasia for example being unable to name band of the watch and struggling slightly with repetition of complex sentences Cranial Nerves: II: Visual Fields are full. Pupils are equal, round, and reactive to light.   III,IV, VI: EOMI without ptosis or diploplia.  V: Facial sensation is symmetric to temperature VII: Facial movement is symmetric.  VIII: hearing is intact to voice X: Uvula elevates symmetrically XI: Shoulder shrug is symmetric. XII: tongue is midline without atrophy or fasciculations.  Motor: Tone is  normal. Bulk is normal. 5/5 strength was present in all four extremities.  Sensory: Sensation is symmetric to light touch and temperature in the arms and legs. Deep Tendon Reflexes: 3+ and symmetric in the biceps and patellae.  2-3 beats of clonus in the right lower extremity Plantars: Toes are downgoing bilaterally.  Cerebellar: FNF on the left is impaired more than heel-to-shin on the left, while the right side is normal  NIHSS total 4 points (technically 6 points given he cannot answer questions correctly but that is his baseline; dysarthria and mild aphasia may be baseline and secondary to his dementia as well) Score breakdown: 2 points for ataxia of the left arm and leg, one-point for mild aphasia, one-point for mild dysarthria  I have reviewed labs in epic and the results pertinent to this consultation are:  1. Left ventricular ejection fraction, by estimation, is 55 to 60%. The  left ventricle has normal function. The left ventricle has no regional  wall motion abnormalities. There is mild left ventricular hypertrophy.  Left ventricular diastolic parameters  are consistent with Grade I diastolic dysfunction (impaired relaxation).   2. Right ventricular systolic function is normal. The right ventricular  size is normal. Mildly increased right ventricular wall thickness.   3. Left atrial size was mildly dilated.   4. The mitral valve is degenerative. Mild to moderate mitral valve  regurgitation. No evidence of mitral stenosis. Moderate mitral annular  calcification.   5. The aortic valve has an indeterminant number of cusps. There is severe  calcifcation of the aortic valve. There is severe thickening of the aortic  valve. Aortic valve regurgitation is trivial. Moderate to severe aortic  valve stenosis. Aortic valve  area, by VTI measures 0.74 cm. Aortic valve mean gradient measures 38.0  mmHg.   I have personally reviewed the CNS images obtained: Head CT on 7/25 with petechial  hemorrhagic conversion of the left cerebellar hypodensity thought to be chronic on initial head CT  CTA, agree with radiology read, petechial hemorrhagic conversion of the cerebellar stroke 1. Negative CTA for large vessel occlusion. 2. Atheromatous change about the carotid bifurcations with associated stenoses of up to 40% on the right and 25% on the left. 3. Atheromatous plaque at the origin of the left vertebral artery with estimated 50-70% ostial stenosis. 4. Otherwise wide patency of the major arterial vasculature of the head and neck. No other hemodynamically significant or correctable stenosis.1. Negative CTA for large vessel occlusion. 2. Atheromatous change about the carotid bifurcations with associated stenoses of up to 40% on the right and 25% on the left. 3. Atheromatous plaque at the origin of the left vertebral artery with estimated 50-70% ostial stenosis. 4. Otherwise wide patency of the major arterial vasculature of the head and neck. No other hemodynamically significant or correctable stenosis.  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  Recommendations: # Cerebellar stroke c/b secondary hemorrhagic conversion - MRI brain to be completed on an outpatient basis given his recent pacemaker placement - CTA completed on my recommendations as above - Frequent neuro checks - Continue to hold antiplatelets at this time given hemorrhagic conversion of ischemic stroke, will reassess tomorrow based on clinical stability  - Okay to resume DVT prophylaxis - Blood pressure goal <160, please gradually approach normotension over the next few days and titrate oral agents as  needed - Neurology to follow  Brooke Dare MD-PhD [facility] [phone] [facility] coverage for [facility] is from 8 AM to 4 AM in-house and 4 PM to 8 PM by telephone/video. 8 PM to 8 AM emergent questions or overnight urgent questions should be addressed to Teleneurology On-call or [facility] neurohospitalist; contact information can be found on AMION

## 2020-09-12 ENCOUNTER — Inpatient Hospital Stay (HOSPITAL_COMMUNITY)
Admit: 2020-09-12 | Discharge: 2020-09-12 | Disposition: A | Discharge status: Home / Self Care | Payer: Medicare Other | Attending: Internal Medicine | Admitting: Internal Medicine

## 2020-09-12 DIAGNOSIS — I639 Cerebral infarction, unspecified: Secondary | ICD-10-CM | POA: Diagnosis not present

## 2020-09-12 DIAGNOSIS — R112 Nausea with vomiting, unspecified: Secondary | ICD-10-CM | POA: Diagnosis not present

## 2020-09-12 DIAGNOSIS — I6502 Occlusion and stenosis of left vertebral artery: Secondary | ICD-10-CM | POA: Diagnosis not present

## 2020-09-12 DIAGNOSIS — I10 Essential (primary) hypertension: Secondary | ICD-10-CM

## 2020-09-12 DIAGNOSIS — I350 Nonrheumatic aortic (valve) stenosis: Secondary | ICD-10-CM | POA: Diagnosis not present

## 2020-09-12 DIAGNOSIS — I6389 Other cerebral infarction: Secondary | ICD-10-CM

## 2020-09-12 LAB — LIPID PANEL
Cholesterol: 94 mg/dL (ref 0–200)
HDL: 29 mg/dL — ABNORMAL LOW (ref >40)
LDL Cholesterol: 47 mg/dL (ref 0–99)
Total CHOL/HDL Ratio: 3.2 RATIO
Triglycerides: 92 mg/dL (ref <150)
VLDL: 18 mg/dL (ref 0–40)

## 2020-09-12 LAB — ECHOCARDIOGRAM COMPLETE
AR max vel: 0.7 cm2
AV Area VTI: 0.74 cm2
AV Area mean vel: 0.68 cm2
AV Mean grad: 38 mmHg
AV Peak grad: 59.6 mmHg
Ao pk vel: 3.86 m/s
Area-P 1/2: 2.39 cm2
Height: 69 in
MV VTI: 2.04 cm2
S' Lateral: 2.5 cm
Weight: 3184 oz

## 2020-09-12 LAB — BASIC METABOLIC PANEL
Anion gap: 9 (ref 5–15)
BUN: 24 mg/dL — ABNORMAL HIGH (ref 8–23)
CO2: 25 mmol/L (ref 22–32)
Calcium: 8.4 mg/dL — ABNORMAL LOW (ref 8.9–10.3)
Chloride: 104 mmol/L (ref 98–111)
Creatinine, Ser: 1.07 mg/dL (ref 0.61–1.24)
GFR, Estimated: >60 mL/min (ref >60)
Glucose, Bld: 120 mg/dL — ABNORMAL HIGH (ref 70–99)
Potassium: 3.4 mmol/L — ABNORMAL LOW (ref 3.5–5.1)
Sodium: 138 mmol/L (ref 135–145)

## 2020-09-12 MED ORDER — HEPARIN SODIUM (PORCINE) 5000 UNIT/ML IJ SOLN
5000.0000 [IU] | Freq: Three times a day (TID) | INTRAMUSCULAR | Status: DC
  Administered 2020-09-12 – 2020-09-15 (×10): 5000 [IU] via SUBCUTANEOUS
  Filled 2020-09-12 (×10): qty 1

## 2020-09-12 MED ORDER — TEMAZEPAM 15 MG PO CAPS: 15.0000 mg | ORAL_CAPSULE | Freq: Every evening | ORAL | Status: DC | PRN

## 2020-09-12 MED ORDER — METOPROLOL SUCCINATE ER 25 MG PO TB24
25.0000 mg | ORAL_TABLET | Freq: Every day | ORAL | Status: DC
  Administered 2020-09-12 – 2020-09-15 (×4): 25 mg via ORAL
  Filled 2020-09-12 (×4): qty 1

## 2020-09-12 MED ORDER — METOPROLOL SUCCINATE ER 25 MG PO TB24: 25.0000 mg | ORAL_TABLET | Freq: Every day | ORAL | Status: DC

## 2020-09-12 MED ORDER — SODIUM CHLORIDE 0.9 % IV BOLUS
500.0000 mL | Freq: Once | INTRAVENOUS | Status: AC
  Administered 2020-09-12: 500 mL via INTRAVENOUS

## 2020-09-12 MED ORDER — POTASSIUM CHLORIDE CRYS ER 20 MEQ PO TBCR
40.0000 meq | EXTENDED_RELEASE_TABLET | Freq: Once | ORAL | Status: AC
  Administered 2020-09-12: 40 meq via ORAL
  Filled 2020-09-12: qty 2

## 2020-09-12 NOTE — Plan of Care (Signed)

## 2020-09-12 NOTE — Progress Notes (Signed)
Patient ID: Timothy Ramirez, male   DOB: Mar 25, 1933, 87 y.o.   MRN: [id number]  Neurology okayed to go back on heparin subcu for DVT prophylaxis.  Dr. Renae Gloss

## 2020-09-12 NOTE — Progress Notes (Signed)
IVIS HENNEMAN  MCV 87.9 87.6  --   PLT 142* 128*  --    Cardiac Enzymes: Recent Labs  Lab 09/09/20 2130  CKTOTAL 55   BNP (last 3 results) Recent Labs    09/09/20 2130  BNP 209.4*     Recent Results (from the past 240 hour(s))  Resp Panel by RT-PCR (Flu A&B, Covid) Nasopharyngeal Swab     Status: None   Collection Time: 09/09/20  5:53 PM   Specimen: Nasopharyngeal Swab; Nasopharyngeal(NP) swabs in vial transport medium  Result Value Ref Range Status   SARS Coronavirus 2 by RT PCR NEGATIVE NEGATIVE Final    Comment: (NOTE) SARS-CoV-2 target nucleic acids are NOT DETECTED.  The SARS-CoV-2 RNA is generally detectable in upper respiratory specimens during the acute phase of infection. The lowest concentration of SARS-CoV-2 viral copies this assay can detect is 138 copies/mL. A negative result does not preclude SARS-Cov-2 infection and should not be used as the sole basis for treatment or other patient management decisions. A negative result may occur with  improper specimen  collection/handling, submission of specimen other than nasopharyngeal swab, presence of viral mutation(s) within the areas targeted by this assay, and inadequate number of viral copies(<138 copies/mL). A negative result must be combined with clinical observations, patient history, and epidemiological information. The expected result is Negative.  Fact Sheet for Patients:  BloggerCourse.com  Fact Sheet for Healthcare Providers:  SeriousBroker.it  This test is no t yet approved or cleared by the [place] FDA and  has been authorized for detection and/or diagnosis of SARS-CoV-2 by FDA under an Emergency Use Authorization (EUA). This EUA will remain  in effect (meaning this test can be used) for the duration of the COVID-19 declaration under Section 564(b)(1) of the Act, 21 U.S.C.section 360bbb-3(b)(1), unless the authorization is terminated  or revoked sooner.       Influenza A by PCR NEGATIVE NEGATIVE Final   Influenza B by PCR NEGATIVE NEGATIVE Final    Comment: (NOTE) The Xpert Xpress SARS-CoV-2/FLU/RSV plus assay is intended as an aid in the diagnosis of influenza from Nasopharyngeal swab specimens and should not be used as a sole basis for treatment. Nasal washings and aspirates are unacceptable for Xpert Xpress SARS-CoV-2/FLU/RSV testing.  Fact Sheet for Patients: BloggerCourse.com  Fact Sheet for Healthcare Providers: SeriousBroker.it  This test is not yet approved or cleared by the [place] FDA and has been authorized for detection and/or diagnosis of SARS-CoV-2 by FDA under an Emergency Use Authorization (EUA). This EUA will remain in effect (meaning this test can be used) for the duration of the COVID-19 declaration under Section 564(b)(1) of the Act, 21 U.S.C. section 360bbb-3(b)(1), unless the authorization is terminated or revoked.  Performed at [facility], [address] [place], [place] [address]      Studies: CT ANGIO HEAD NECK W WO CM  Result Date: 09/11/2020 CLINICAL DATA:  Initial evaluation for acute stroke. EXAM: CT ANGIOGRAPHY HEAD AND NECK TECHNIQUE: Multidetector CT imaging of the head and neck was performed using the standard protocol during bolus administration of intravenous contrast. Multiplanar CT image reconstructions and MIPs were obtained to evaluate the vascular anatomy. Carotid stenosis measurements (when applicable) are obtained utilizing NASCET criteria, using the distal internal carotid diameter as the denominator. CONTRAST:  75mL OMNIPAQUE IOHEXOL 350 MG/ML SOLN COMPARISON:  Head CT from earlier the same day. FINDINGS: CT HEAD FINDINGS Brain: Evolving subacute left cerebellar infarct again seen, relatively stable in size and distribution from prior. Associated  edema is slightly more hypodense and well-defined as compared to prior. Evidence for associated petechial hemorrhage without frank intraparenchymal hematoma, unchanged. Mild mass effect on the adjacent fourth ventricle again noted, stable. No hydrocephalus or trapping. No other new acute intracranial abnormality. Underlying atrophy with chronic small vessel ischemic disease again noted. Chronic lacunar infarcts noted at the right thalamus. Additional chronic left cerebellar infarct noted. No mass lesion or midline shift. No extra-axial fluid collection. Vascular: No hyperdense vessel. Scattered vascular calcifications noted within the carotid siphons. Skull: Scalp soft tissues and calvarium within normal limits. Sinuses: Mild mucosal thickening with a few scattered retention cyst noted within the spine paranasal sinuses. No mastoid effusion. Orbits: Globes orbital soft tissues demonstrate no acute finding. Review of the MIP images confirms the above findings CTA NECK FINDINGS Aortic arch: Visualized aortic arch normal in caliber with normal 3 vessel morphology.  Mild atheromatous change about the arch and origin of the great vessels without hemodynamically significant stenosis. Right carotid system: Right CCA tortuous proximally but is widely patent to the bifurcation. Eccentric calcified plaque at the right carotid bulb/proximal right ICA with associated stenosis of up to 40% by NASCET criteria. Right ICA patent distally without stenosis, dissection or occlusion. Left carotid system: Left CCA mildly tortuous but is widely patent to the bifurcation without stenosis. Bulky calcified plaque about the left carotid bulb/proximal left ICA with no more than mild 25% stenosis by NASCET criteria. Left ICA patent distally without stenosis, dissection or occlusion. Vertebral arteries: Both vertebral arteries arise from the subclavian arteries. No proximal subclavian artery stenosis. Atheromatous plaque at the origin of the left vertebral artery with estimated 50-70% ostial stenosis. Vertebral arteries otherwise patent within the neck without stenosis, dissection or occlusion. Skeleton: No visible acute osseous finding. No worrisome osseous lesions. Moderate multilevel cervical spondylosis without high-grade spinal stenosis. Other neck: No other acute soft tissue abnormality within the neck. No mass or adenopathy. Upper chest: Visualized upper chest demonstrates no acute finding. Left-sided pacemaker/AICD partially visualized. Review of the MIP images confirms the above findings CTA HEAD FINDINGS Anterior circulation: Petrous segments patent bilaterally. Scattered atheromatous change within the carotid siphons with no more than mild multifocal stenosis. A1 segments widely patent. Normal anterior communicating artery complex. Anterior cerebral arteries patent to their distal aspects without stenosis. Normal in stenosis or occlusion. Normal MCA bifurcations. Distal MCA branches well perfused and symmetric. Posterior circulation: Both V4 segments patent to the vertebrobasilar junction  without stenosis. Both PICA origins patent and normal. Basilar somewhat diminutive but patent to its distal aspect without stenosis. Superior cerebellar arteries patent bilaterally. Predominant fetal type origin of the PCAs bilaterally. Both PCAs well perfused to their distal aspects without stenosis. Venous sinuses: Grossly patent allowing for timing the contrast bolus. Anatomic variants: Predominant fetal type origin of the PCAs bilaterally. No intracranial aneurysm or other vascular abnormality. Review of the MIP images confirms the above findings IMPRESSION: CT HEAD IMPRESSION: 1. Evolving subacute left cerebellar infarct, overall relatively stable in size and distribution from prior. Associated petechial hemorrhage without frank intraparenchymal hematoma, stable. Localized mass effect on the adjacent fourth ventricle without hydrocephalus, also unchanged. 2. No other new acute intracranial abnormality. CTA HEAD AND NECK IMPRESSION: 1. Negative CTA for large vessel occlusion. 2. Atheromatous change about the carotid bifurcations with associated stenoses of up to 40% on the right and 25% on the left. 3. Atheromatous plaque at the origin of the left vertebral artery with estimated 50-70% ostial stenosis. 4. Otherwise wide patency of the  major arterial vasculature of the head and neck. No other hemodynamically significant or correctable stenosis. Electronically Signed   By: Rise MuBenjamin  McClintock M.D.   On: 09/11/2020 23:02   DG Chest 2 View  Result Date: 09/11/2020 CLINICAL DATA:  87 year old male with a history shortness of breath and hypertension EXAM: CHEST - 2 VIEW COMPARISON:  09/09/2020 FINDINGS: Cardiomediastinal silhouette unchanged in size and contour. Improved inspiration and lung volumes, with persisting linear opacities at the bilateral lung bases. No pleural effusion pneumothorax or new confluent airspace disease. No interlobular septal thickening. No central vascular congestion. Cardiac pacer on  the left chest wall. Degenerative changes of the spine.  No acute displaced fracture IMPRESSION: Improved inspiratory effort, with likely background of chronic changes and linear atelectasis. Unchanged cardiac pacing device Electronically Signed   By: Gilmer MorJaime  Wagner D.O.   On: 09/11/2020 08:06   CT HEAD WO CONTRAST  Result Date: 09/11/2020 CLINICAL DATA:  Nausea, vomiting, neurological deficit, acute, stroke suspected. EXAM: CT HEAD WITHOUT CONTRAST TECHNIQUE: Contiguous axial images were obtained from the base of the skull through the vertex without intravenous contrast. COMPARISON:  09/09/2020.  MRI 06/06/2020. FINDINGS: Brain: There is subacute infarction within the cerebellum on the left measuring about 3.5 cm in size. Since the previous study, there is some internal petechial bleeding. Mild mass-effect upon the fourth ventricle but no ventricular obstruction Old small vessel infarctions in the right cerebellum are unchanged. Old peripheral left cerebellar infarction is unchanged. Old small vessel infarctions in the right thalamus are unchanged. Chronic small-vessel ischemic change of the cerebral hemispheric white matter are unchanged. No hydrocephalus.  No extra-axial collection. Vascular: There is atherosclerotic calcification of the major vessels at the base of the brain. Skull: Negative Sinuses/Orbits: Clear/normal Other: None IMPRESSION: Subacute infarction in the left cerebellum measuring about 3.5 cm in size. Since the previous study, petechial blood products are present in this area now. No discrete hematoma however. Mild mass-effect upon the fourth ventricle but no ventricular obstruction. No other acute intracranial finding. Old infarctions in the cerebellum, right thalamus and cerebral hemispheric white matter. Electronically Signed   By: Paulina FusiMark  Shogry M.D.   On: 09/11/2020 15:43   NM Pulmonary Perfusion  Result Date: 09/11/2020 CLINICAL DATA:  PE suspected, high probability; altered mental  status/positive D-dimer. EXAM: NUCLEAR MEDICINE PERFUSION LUNG SCAN TECHNIQUE: Perfusion images were obtained in multiple projections after intravenous injection of radiopharmaceutical. Ventilation scans intentionally deferred if perfusion scan and chest x-ray adequate for interpretation during COVID 19 epidemic. RADIOPHARMACEUTICALS:  4.25 mCi [id number] MAA IV COMPARISON:  Chest x-ray today FINDINGS: No wedge-shaped perfusion defects seen to suggest pulmonary embolus. IMPRESSION: No evidence of pulmonary embolus. Electronically Signed   By: Charlett NoseKevin  Dover M.D.   On: 09/11/2020 12:15   ECHOCARDIOGRAM COMPLETE  Result Date: 09/12/2020    ECHOCARDIOGRAM REPORT   Patient Name:   Sammuel CooperROBERT M Dery Date of Exam: 09/12/2020 Medical Rec #:  [id number]       Height:       69.0 in Accession #:    [id number]      Weight:       199.0 lb Date of Birth:  21-Oct-1933       BSA:          2.062 m Patient Age:    86 years        BP:           169/67 mmHg Patient Gender: M  HR:           60 bpm. Exam Location:  [facility] Procedure: 2D Echo, Color Doppler and Cardiac Doppler Indications:     I63.9 Stroke  History:         Patient has prior history of Echocardiogram examinations, most                  recent 11/08/2019. CAD, Pacemaker, Aortic Valve Disease; Risk                  Factors:Hypertension and Dyslipidemia.  Sonographer:     Humphrey Rolls RDCS (AE) Referring Phys:  [id number] Alford Highland Diagnosing Phys: Yvonne Kendall MD  Sonographer Comments: No subcostal window. IMPRESSIONS  1. Left ventricular ejection fraction, by estimation, is 55 to 60%. The left ventricle has normal function. The left ventricle has no regional wall motion abnormalities. There is mild left ventricular hypertrophy. Left ventricular diastolic parameters are consistent with Grade I diastolic dysfunction (impaired relaxation).  2. Right ventricular systolic function is normal. The right ventricular size is normal. Mildly increased right ventricular wall  thickness.  3. Left atrial size was mildly dilated.  4. The mitral valve is degenerative. Mild to moderate mitral valve regurgitation. No evidence of mitral stenosis. Moderate mitral annular calcification.  5. The aortic valve has an indeterminant number of cusps. There is severe calcifcation of the aortic valve. There is severe thickening of the aortic valve. Aortic valve regurgitation is trivial. Moderate to severe aortic valve stenosis. Aortic valve area, by VTI measures 0.74 cm. Aortic valve mean gradient measures 38.0 mmHg. Comparison(s): A prior study was performed on 11/08/2019. Aortic stenosis has worsened and is now moderate to severe (mean gradient 31->38 mmHg, AVA 1.4->0.7 cm^2). FINDINGS  Left Ventricle: Left ventricular ejection fraction, by estimation, is 55 to 60%. The left ventricle has normal function. The left ventricle has no regional wall motion abnormalities. The left ventricular internal cavity size was normal in size. There is  mild left ventricular hypertrophy. Left ventricular diastolic parameters are consistent with Grade I diastolic dysfunction (impaired relaxation). Right Ventricle: The right ventricular size is normal. Mildly increased right ventricular wall thickness. Right ventricular systolic function is normal. Left Atrium: Left atrial size was mildly dilated. Right Atrium: Right atrial size was normal in size. Pericardium: There is no evidence of pericardial effusion. Presence of pericardial fat pad. Mitral Valve: The mitral valve is degenerative in appearance. There is mild thickening of the mitral valve leaflet(s). Moderate mitral annular calcification. Mild to moderate mitral valve regurgitation. No evidence of mitral valve stenosis. MV peak gradient, 4.7 mmHg. The mean mitral valve gradient is 1.0 mmHg. Tricuspid Valve: The tricuspid valve is normal in structure. Tricuspid valve regurgitation is not demonstrated. Aortic Valve: The aortic valve has an indeterminant number of  cusps. There is severe calcifcation of the aortic valve. There is severe thickening of the aortic valve. Aortic valve regurgitation is trivial. Moderate to severe aortic stenosis is present. Aortic valve mean gradient measures 38.0 mmHg. Aortic valve peak gradient measures 59.6 mmHg. Aortic valve area, by VTI measures 0.74 cm. Pulmonic Valve: The pulmonic valve was not well visualized. Pulmonic valve regurgitation is not visualized. No evidence of pulmonic stenosis. Aorta: The aortic root is normal in size and structure. Pulmonary Artery: The pulmonary artery is of normal size. Venous: The inferior vena cava was not well visualized. IAS/Shunts: The interatrial septum was not well visualized. Additional Comments: A device lead is visualized in the right ventricle.  LEFT  VENTRICLE PLAX 2D LVIDd:         4.50 cm  Diastology LVIDs:         2.50 cm  LV e' medial:    5.11 cm/s LV PW:         1.40 cm  LV E/e' medial:  12.7 LV IVS:        1.10 cm  LV e' lateral:   4.68 cm/s LVOT diam:     2.10 cm  LV E/e' lateral: 13.8 LV SV:         60 LV SV Index:   29 LVOT Area:     3.46 cm  RIGHT VENTRICLE RV Basal diam:  1.80 cm LEFT ATRIUM             Index       RIGHT ATRIUM           Index LA diam:        3.80 cm 1.84 cm/m  RA Area:     10.45 cm 5.07 cm/m LA Vol (A2C):   54.9 ml 26.63 ml/m LA Vol (A4C):   42.4 ml 20.57 ml/m LA Biplane Vol: 51.0 ml 24.74 ml/m  AORTIC VALVE                    PULMONIC VALVE AV Area (Vmax):    0.70 cm     PV Vmax:       1.38 m/s AV Area (Vmean):   0.68 cm     PV Vmean:      83.200 cm/s AV Area (VTI):     0.74 cm     PV VTI:        0.241 m AV Vmax:           386.00 cm/s  PV Peak grad:  7.6 mmHg AV Vmean:          265.000 cm/s PV Mean grad:  3.0 mmHg AV VTI:            0.805 m AV Peak Grad:      59.6 mmHg AV Mean Grad:      38.0 mmHg LVOT Vmax:         77.70 cm/s LVOT Vmean:        51.900 cm/s LVOT VTI:          0.173 m LVOT/AV VTI ratio: 0.21  AORTA Ao Root diam: 3.20 cm MITRAL VALVE MV Area  (PHT): 2.39 cm    SHUNTS MV Area VTI:   2.04 cm    Systemic VTI:  0.17 m MV Peak grad:  4.7 mmHg    Systemic Diam: 2.10 cm MV Mean grad:  1.0 mmHg MV Vmax:       1.08 m/s MV Vmean:      56.8 cm/s MV Decel Time: 318 msec MV E velocity: 64.70 cm/s MV A velocity: 95.10 cm/s MV E/A ratio:  0.68 Christopher End MD Electronically signed by Yvonne Kendall MD Signature Date/Time: 09/12/2020/1:47:39 PM    Final     Scheduled Meds:  amLODipine  5 mg Oral Daily   atorvastatin  40 mg Oral Daily   citalopram  20 mg Oral Daily   feeding supplement  237 mL Oral Q24H   memantine  5 mg Oral BID   metoprolol succinate  25 mg Oral Daily   pantoprazole (PROTONIX) IV  40 mg Intravenous Q12H   vitamin B-12  1,000 mcg Oral Daily   Brief history.  Patient admitted 09/09/2020 with  generalized weakness altered mental status and nausea vomiting.  Initially suspected to be gastroenteritis.  I saw the patient on 09/11/2020 and his finger-nose test was impaired with his left hand.  I repeated a CT scan of the head which showed a subacute infarction left cerebellum measuring about 3.5 cm in size with petechial blood products.  I consulted neurology.  Aspirin and heparin subcutaneous injections were discontinued.  Neurology would like to do further monitoring in the hospital.  Appetite still not good.  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  Impaired finger-nose left hand.  Impaired balance with trying to get him up today.  Case discussed with neurology on 09/11/2020.  Discontinued aspirin and heparin subcutaneous injections.  Telemetry monitoring shows AV paced with PVCs.  CT angio shows left vertebral stenosis 50 to 70%.  Telemetry monitoring shows AV paced with PVCs.  Echocardiogram shows progression of aortic stenosis moderate to severe.  LDL 47.  Continue atorvastatin. Essential hypertension on Norvasc.  We will add Toprol-XL.  Need to avoid afterload reduction medications with aortic  stenosis Moderate to severe aortic stenosis.  Case discussed with Dr. Okey Dupre his cardiologist and he recommends follow-up as outpatient for consideration of TAVR in the future.  Nothing to be done right now with acute stroke. Weakness.  Physical therapy recommending rehab Hyperlipidemia unspecified on atorvastatin Acute kidney injury and dehydration improved with initial fluids Nausea vomiting and poor appetite likely secondary to stroke Depression on Celexa Memory loss on Namenda History of B12 deficiency Impaired fasting glucose.  Hemoglobin A1c 6.4        Code Status:     Code Status Orders  (From admission, onward)           Start     Ordered   09/09/20 2119  Full code  Continuous        09/09/20 2118           Code Status History     Date Active Date Inactive Code Status Order ID Comments User Context   07/12/2020 1556 07/12/2020 2327 DNR [id number]  Erick Blinks, MD Inpatient   07/11/2020 1221 07/12/2020 1555 Full Code [id number]  Enedina Finner, MD ED   02/03/2020 1047 02/03/2020 1843 Full Code [id number]  Yvonne Kendall, MD Inpatient   11/08/2019 0351 11/09/2019 2032 Full Code [id number]  Andris Baumann, MD ED      Advance Directive Documentation    Flowsheet Row Most Recent Value  Type of Advance Directive Healthcare Power of Attorney, Living will  Pre-existing out of facility DNR order (yellow form or pink MOST form) --  "MOST" Form in Place? --      Family Communication: Updated daughter on the phone Disposition Plan: Status is: Inpatient  Dispo: The patient is from: Home              Anticipated d/c is to: Rehab              Patient currently being watched closely for cerebellar stroke and petechial hemorrhage.  Neurology wants to reevaluate tomorrow.   Difficult to place patient.  No.  Consultants: Neurology  Time spent: 27 minutes  Vinisha Faxon [employer]

## 2020-09-12 NOTE — Progress Notes (Signed)
*  PRELIMINARY RESULTS* Echocardiogram 2D Echocardiogram has been performed.  Timothy Ramirez Timothy Ramirez 09/12/2020, 11:36 AM

## 2020-09-13 DIAGNOSIS — R531 Weakness: Secondary | ICD-10-CM | POA: Diagnosis not present

## 2020-09-13 LAB — COMPREHENSIVE METABOLIC PANEL
ALT: 21 U/L (ref 0–44)
AST: 26 U/L (ref 15–41)
Albumin: 3.2 g/dL — ABNORMAL LOW (ref 3.5–5.0)
Alkaline Phosphatase: 86 U/L (ref 38–126)
Anion gap: 8 (ref 5–15)
BUN: 21 mg/dL (ref 8–23)
CO2: 25 mmol/L (ref 22–32)
Calcium: 8.6 mg/dL — ABNORMAL LOW (ref 8.9–10.3)
Chloride: 104 mmol/L (ref 98–111)
Creatinine, Ser: 1.09 mg/dL (ref 0.61–1.24)
GFR, Estimated: >60 mL/min (ref >60)
Glucose, Bld: 124 mg/dL — ABNORMAL HIGH (ref 70–99)
Potassium: 3.9 mmol/L (ref 3.5–5.1)
Sodium: 137 mmol/L (ref 135–145)
Total Bilirubin: 2.4 mg/dL — ABNORMAL HIGH (ref 0.3–1.2)
Total Protein: 6.2 g/dL — ABNORMAL LOW (ref 6.5–8.1)

## 2020-09-13 LAB — GLUCOSE, CAPILLARY: Glucose-Capillary: 164 mg/dL — ABNORMAL HIGH (ref 70–99)

## 2020-09-13 MED ORDER — COVID-19 MRNA VAC-TRIS(PFIZER) 30 MCG/0.3ML IM SUSP
0.3000 mL | Freq: Once | INTRAMUSCULAR | Status: AC
  Administered 2020-09-13: 0.3 mL via INTRAMUSCULAR
  Filled 2020-09-13: qty 0.3

## 2020-09-13 MED ORDER — ASPIRIN EC 81 MG PO TBEC
81.0000 mg | DELAYED_RELEASE_TABLET | Freq: Every day | ORAL | Status: DC
  Administered 2020-09-14 – 2020-09-15 (×2): 81 mg via ORAL
  Filled 2020-09-13 (×2): qty 1

## 2020-09-13 MED ORDER — POLYETHYLENE GLYCOL 3350 17 G PO PACK
17.0000 g | PACK | Freq: Every day | ORAL | Status: DC
  Administered 2020-09-13 – 2020-09-15 (×3): 17 g via ORAL
  Filled 2020-09-13 (×3): qty 1

## 2020-09-13 NOTE — Progress Notes (Signed)
[facility]  Date: 09/13/20  Time: 2:10 PM  Patient's pacemaker was interrogated at the request of Dr. Iver Nestle to exclude PAF in setting of recent stroke.  Device interrogation shows no recorded AT/AF.  Complete interrogation placed in patient's paper chart.  Yvonne Kendall, MD [facility]

## 2020-09-13 NOTE — Progress Notes (Signed)
Neurology Progress Note  Patient ID: Timothy Ramirez is a 87 y.o. with PMHx of  has a past medical history of Aortic stenosis, Borderline diabetes, Complete heart block (HCC) (06/2020), Coronary artery disease (01/2020), Hyperlipidemia, and Hypertension.  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  Major interval events/Subjective: -Daughter-in-law at bedside, patient remains stable with no new concerns from patient or family including no headache, no new weakness, stable ataxia  Exam: Vitals:   09/13/20 0603 09/13/20 0855  BP: (!) 143/69 (!) 180/75  Pulse: 60 61  Resp: 18 18  Temp: 98.6 F (37 C) 97.6 F (36.4 C)  SpO2: 95% 96%   Gen: In bed, comfortable  Resp: non-labored breathing, no grossly audible wheezing Cardiac: Perfusing extremities well  Abd: soft, nt  Neuro: MS: Awake, alert, correctly identifies daughter-in-law at bedside but disoriented to time/place CN: EOMI to tracking examiner, face symmetric, tongue midline Motor: 5/5 strength throughout upper and lower extremities Coordination: Marked ataxia in the left upper extremity more than the left lower extremity, normal on the right   Current Facility-Administered Medications:    acetaminophen (TYLENOL) tablet 650 mg, 650 mg, Oral, Q6H PRN, Hughie Closs, MD, 650 mg at 09/10/20 1750   amLODipine (NORVASC) tablet 5 mg, 5 mg, Oral, Daily, Pahwani, Ravi, MD, 5 mg at 09/13/20 0949   atorvastatin (LIPITOR) tablet 40 mg, 40 mg, Oral, Daily, Renae Gloss, Richard, MD, 40 mg at 09/13/20 0949   citalopram (CELEXA) tablet 20 mg, 20 mg, Oral, Daily, Irena Cords V, MD, 20 mg at 09/13/20 0949   feeding supplement (ENSURE ENLIVE / ENSURE PLUS) liquid 237 mL, 237 mL, Oral, Q24H, Wieting, Richard, MD, 237 mL at 09/12/20 1728   heparin injection 5,000 Units, 5,000 Units, Subcutaneous, Q8H, Wieting, Richard, MD, 5,000 Units at 09/13/20 0607   memantine (NAMENDA) tablet 5 mg, 5 mg, Oral, BID, Irena Cords V, MD, 5 mg at 09/13/20 0949   metoprolol succinate (TOPROL-XL) 24 hr tablet 25 mg, 25 mg, Oral, Daily, Renae Gloss, Richard, MD, 25 mg at 09/13/20 0949   ondansetron (ZOFRAN) injection 4 mg, 4 mg, Intravenous, Q6H PRN, Hughie Closs, MD, 4 mg at 09/10/20 1645   pantoprazole (PROTONIX) injection 40 mg, 40 mg, Intravenous, Q12H, Irena Cords V, MD, 40 mg at 09/13/20 0950   temazepam (RESTORIL) capsule 15 mg, 15 mg, Oral, QHS PRN, Renae Gloss, Richard, MD   vitamin B-12 (CYANOCOBALAMIN) tablet 1,000 mcg, 1,000 mcg, Oral, Daily, Irena Cords V, MD, 1,000 mcg at 09/13/20 0950  Pertinent Labs:  Basic Metabolic Panel: Recent Labs  Lab 09/09/20 1548 09/09/20 2130 09/10/20 0651 09/12/20 0522 09/13/20 0819  NA 137  --  138 138 137  K 4.1  --  3.7 3.4* 3.9  CL 106  --  108 104 104  CO2 20*  --  24 25 25   GLUCOSE 184*  --  131* 120* 124*  BUN 24*  --  21 24* 21  CREATININE 1.30*  --  1.04 1.07 1.09  CALCIUM 9.3  --  8.6* 8.4* 8.6*  MG  --  1.9  --   --   --     CBC: Recent Labs  Lab 09/09/20 1548 09/10/20 0651 09/11/20 2132  WBC 11.6* 9.5  --   HGB 17.1* 15.2  --   HCT 47.8 43.2 42.1  MCV 87.9 87.6  --   PLT 142* 128*  --     Coagulation Studies: No results for input(s): LABPROT, INR in the last 72 hours.  Impression: Petechial hemorrhagic conversion of a left cerebellar stroke explaining patient's new onset nausea/vomiting/ataxia.  Appearance is embolic in nature, atheroembolic versus cardioembolic, therefore embolic of undetermined source at this time  Recommendations: - MRI brain to be completed on an outpatient basis given his recent pacemaker placement - Appreciate cardiology interrogation of his pacemaker to assess for atrial fibrillation, rare in the setting of complete heart block but possible.  Given petechial hemorrhagic conversion of his recent stroke, would not start anticoagulation at this time.  Patient may be considered for this on an outpatient basis - Blood  pressure goal is less than 160 and is out of range this morning, please titrate oral agents as needed to achieve blood pressure control.  Once blood pressure is controlled, please start aspirin 81 mg daily -Outpatient follow-up with Dr. Marc Morgans MD-PhD [facility] [phone]   Greater than 25 minutes were spent in the care of this patient today including discussion with cardiology, primary team via secure chat as well as daughter at bedside.  Greater than 50% of the time was at bedside.

## 2020-09-13 NOTE — Progress Notes (Signed)
PROGRESS NOTE    Timothy Ramirez  BJY:[id number] DOB: August 03, 1933 DOA: 09/09/2020 PCP: Jerl Mina, MD   Brief Narrative: Taken from prior notes. Patient admitted 09/09/2020 with generalized weakness altered mental status and nausea vomiting.  Initially suspected to be gastroenteritis.  Found to have some impairment on finger nose test on 09/11/2020 and CT scanning was repeated which shows a subacute left cerebellar infarct with some petechial hemorrhage.  Neurology was consulted.  Initially antiplatelets and DVT prophylaxis was held for concern of hemorrhagic conversion.  Later neurology cleared to start DVT prophylaxis. History of recent pacemaker placement which was interrogated at neurology result and did not show any A. Fib. Neurology is recommending starting low-dose aspirin after better control of blood pressure. PT is recommending SNF placement-we will be able to go on Friday per TOC. Ordered second booster of Pfizer vaccine at family's request.  Subjective: Patient was seen and examined today.  No new complaints.  Wife at bedside, appetite remains poor.  History of advanced dementia.  Assessment & Plan:   Principal Problem:   Weakness Active Problems:   Severe aortic stenosis   Essential hypertension   Elevated troponin   Diabetes mellitus type 2, uncomplicated (HCC)   CAD in native artery   Nausea & vomiting   Gastroenteritis   Acute stroke due to ischemia [facility])   Depression   Memory loss   Stenosis of left vertebral artery  Left cerebellum stroke with petechial hemorrhage.  Currently stable and improving. Neurology is on board and recommending interrogation of his recently placed pacemaker to rule out A. Fib.  Per cardiology pacemaker was interrogated and did not found any A. fib. CT angio shows left vertebral stenosis 50 to 70%.  Telemetry monitoring shows AV paced with PVCs.  Echocardiogram shows progression of aortic stenosis moderate to severe.  LDL 47. -PT is  recommending SNF placement-had bed offer for Friday. -Neurology is recommending starting aspirin once blood pressure below 160. -Continue with statin  Essential hypertension.  Blood pressure elevated this morning.  Improved after taking morning meds. -Continue Norvasc and Toprol.  Moderate to severe aortic stenosis.  Cardiology was consulted and they are recommending outpatient follow-up, patient might need TAVR in the future.  AKI.  Most likely secondary to dehydration with nausea and vomiting and poor p.o. intake. Renal function improved with IV fluid  Positive troponin.  Most likely secondary to demand ischemia.  No chest pain, flat and improving curve.  Depression. -Continue Celexa  Advanced dementia. -Continue home dose of Namenda. -Delirium precautions  Impaired fasting glucose.  A1c of 6.4 which makes him prediabetic. -Continue to monitor  Objective: Vitals:   09/13/20 0603 09/13/20 0855 09/13/20 1156 09/13/20 1557  BP: (!) 143/69 (!) 180/75 (!) 152/74 122/61  Pulse: 60 61 (!) 58 (!) 59  Resp: Temp: 98.6 F (37 C) 97.6 F (36.4 C) 97.8 F (36.6 C) (!) 97.4 F (36.3 C)  TempSrc: Oral   Oral  SpO2: 95% 96% 97% 94%  Weight:      Height:        Intake/Output Summary (Last 24 hours) at 09/13/2020 1602 Last data filed at 09/13/2020 1428 Gross per 24 hour  Intake 360 ml  Output 200 ml  Net 160 ml   Filed Weights   09/09/20 1534  Weight: 90.3 kg    Examination:  General exam: Appears calm and comfortable  Respiratory system: Clear to auscultation. Respiratory effort normal. Cardiovascular system: S1 & S2 heard,  RRR.  Gastrointestinal system: Soft, nontender, nondistended, bowel sounds positive. Central nervous system: Alert and oriented.  Mild left-sided dysmetria Extremities: No edema, no cyanosis, pulses intact and symmetrical. Psychiatry: Judgement and insight appear normal.   DVT prophylaxis: Heparin Code Status: DNR Family Communication:  Wife was updated in room. Disposition Plan:  Status is: Inpatient  Remains inpatient appropriate because:Inpatient level of care appropriate due to severity of illness  Dispo: The patient is from: Home              Anticipated d/c is to: SNF              Patient currently is medically stable to d/c.   Difficult to place patient No               Level of care: Med-Surg  All the records are reviewed and case discussed with Care Management/Social Worker. Management plans discussed with the patient, nursing and they are in agreement.  Consultants:  Neurology  Procedures:  Antimicrobials:   Data Reviewed: I have personally reviewed following labs and imaging studies  CBC: Recent Labs  Lab 09/09/20 1548 09/10/20 0651 09/11/20 2132  WBC 11.6* 9.5  --   HGB 17.1* 15.2  --   HCT 47.8 43.2 42.1  MCV 87.9 87.6  --   PLT 142* 128*  --    Basic Metabolic Panel: Recent Labs  Lab 09/09/20 1548 09/09/20 2130 09/10/20 0651 09/12/20 0522 09/13/20 0819  NA 137  --  138 138 137  K 4.1  --  3.7 3.4* 3.9  CL 106  --  108 104 104  CO2 20*  --  24 25 25   GLUCOSE 184*  --  131* 120* 124*  BUN 24*  --  21 24* 21  CREATININE 1.30*  --  1.04 1.07 1.09  CALCIUM 9.3  --  8.6* 8.4* 8.6*  MG  --  1.9  --   --   --    GFR: Estimated Creatinine Clearance: 56 mL/min (by C-G formula based on SCr of 1.09 mg/dL). Liver Function Tests: Recent Labs  Lab 09/09/20 1548 09/10/20 0651 09/13/20 0819  AST 24 18 26   ALT 22 16 21   ALKPHOS 89 77 86  BILITOT 2.1* 2.1* 2.4*  PROT 7.2 5.9* 6.2*  ALBUMIN 3.8 3.3* 3.2*   Recent Labs  Lab 09/09/20 2130  LIPASE 19   No results for input(s): AMMONIA in the last 168 hours. Coagulation Profile: No results for input(s): INR, PROTIME in the last 168 hours. Cardiac Enzymes: Recent Labs  Lab 09/09/20 2130  CKTOTAL 55   BNP (last 3 results) No results for input(s): PROBNP in the last 8760 hours. HbA1C: No results for input(s): HGBA1C in the last  72 hours. CBG: No results for input(s): GLUCAP in the last 168 hours. Lipid Profile: Recent Labs    09/12/20 0522  CHOL 94  HDL 29*  LDLCALC 47  TRIG 92  CHOLHDL 3.2   Thyroid Function Tests: No results for input(s): TSH, T4TOTAL, FREET4, T3FREE, THYROIDAB in the last 72 hours. Anemia Panel: No results for input(s): VITAMINB12, FOLATE, FERRITIN, TIBC, IRON, RETICCTPCT in the last 72 hours. Sepsis Labs: No results for input(s): PROCALCITON, LATICACIDVEN in the last 168 hours.  Recent Results (from the past 240 hour(s))  Resp Panel by RT-PCR (Flu A&B, Covid) Nasopharyngeal Swab     Status: None   Collection Time: 09/09/20  5:53 PM   Specimen: Nasopharyngeal Swab; Nasopharyngeal(NP) swabs in vial transport medium  Result Value Ref Range Status   SARS Coronavirus 2 by RT PCR NEGATIVE NEGATIVE Final    Comment: (NOTE) SARS-CoV-2 target nucleic acids are NOT DETECTED.  The SARS-CoV-2 RNA is generally detectable in upper respiratory specimens during the acute phase of infection. The lowest concentration of SARS-CoV-2 viral copies this assay can detect is 138 copies/mL. A negative result does not preclude SARS-Cov-2 infection and should not be used as the sole basis for treatment or other patient management decisions. A negative result may occur with  improper specimen collection/handling, submission of specimen other than nasopharyngeal swab, presence of viral mutation(s) within the areas targeted by this assay, and inadequate number of viral copies(<138 copies/mL). A negative result must be combined with clinical observations, patient history, and epidemiological information. The expected result is Negative.  Fact Sheet for Patients:  BloggerCourse.com  Fact Sheet for Healthcare Providers:  SeriousBroker.it  This test is no t yet approved or cleared by the [place] FDA and  has been authorized for detection and/or  diagnosis of SARS-CoV-2 by FDA under an Emergency Use Authorization (EUA). This EUA will remain  in effect (meaning this test can be used) for the duration of the COVID-19 declaration under Section 564(b)(1) of the Act, 21 U.S.C.section 360bbb-3(b)(1), unless the authorization is terminated  or revoked sooner.       Influenza A by PCR NEGATIVE NEGATIVE Final   Influenza B by PCR NEGATIVE NEGATIVE Final    Comment: (NOTE) The Xpert Xpress SARS-CoV-2/FLU/RSV plus assay is intended as an aid in the diagnosis of influenza from Nasopharyngeal swab specimens and should not be used as a sole basis for treatment. Nasal washings and aspirates are unacceptable for Xpert Xpress SARS-CoV-2/FLU/RSV testing.  Fact Sheet for Patients: BloggerCourse.com  Fact Sheet for Healthcare Providers: SeriousBroker.it  This test is not yet approved or cleared by the [place] FDA and has been authorized for detection and/or diagnosis of SARS-CoV-2 by FDA under an Emergency Use Authorization (EUA). This EUA will remain in effect (meaning this test can be used) for the duration of the COVID-19 declaration under Section 564(b)(1) of the Act, 21 U.S.C. section 360bbb-3(b)(1), unless the authorization is terminated or revoked.  Performed at [facility], [address], [place], [place] [address]      Radiology Studies: CT ANGIO HEAD NECK W WO CM  Result Date: 09/11/2020 CLINICAL DATA:  Initial evaluation for acute stroke. EXAM: CT ANGIOGRAPHY HEAD AND NECK TECHNIQUE: Multidetector CT imaging of the head and neck was performed using the standard protocol during bolus administration of intravenous contrast. Multiplanar CT image reconstructions and MIPs were obtained to evaluate the vascular anatomy. Carotid stenosis measurements (when applicable) are obtained utilizing NASCET criteria, using the distal internal carotid diameter as the denominator.  CONTRAST:  75mL OMNIPAQUE IOHEXOL 350 MG/ML SOLN COMPARISON:  Head CT from earlier the same day. FINDINGS: CT HEAD FINDINGS Brain: Evolving subacute left cerebellar infarct again seen, relatively stable in size and distribution from prior. Associated edema is slightly more hypodense and well-defined as compared to prior. Evidence for associated petechial hemorrhage without frank intraparenchymal hematoma, unchanged. Mild mass effect on the adjacent fourth ventricle again noted, stable. No hydrocephalus or trapping. No other new acute intracranial abnormality. Underlying atrophy with chronic small vessel ischemic disease again noted. Chronic lacunar infarcts noted at the right thalamus. Additional chronic left cerebellar infarct noted. No mass lesion or midline shift. No extra-axial fluid collection. Vascular: No hyperdense vessel. Scattered vascular calcifications noted within the carotid siphons.  Skull: Scalp soft tissues and calvarium within normal limits. Sinuses: Mild mucosal thickening with a few scattered retention cyst noted within the spine paranasal sinuses. No mastoid effusion. Orbits: Globes orbital soft tissues demonstrate no acute finding. Review of the MIP images confirms the above findings CTA NECK FINDINGS Aortic arch: Visualized aortic arch normal in caliber with normal 3 vessel morphology. Mild atheromatous change about the arch and origin of the great vessels without hemodynamically significant stenosis. Right carotid system: Right CCA tortuous proximally but is widely patent to the bifurcation. Eccentric calcified plaque at the right carotid bulb/proximal right ICA with associated stenosis of up to 40% by NASCET criteria. Right ICA patent distally without stenosis, dissection or occlusion. Left carotid system: Left CCA mildly tortuous but is widely patent to the bifurcation without stenosis. Bulky calcified plaque about the left carotid bulb/proximal left ICA with no more than mild 25% stenosis  by NASCET criteria. Left ICA patent distally without stenosis, dissection or occlusion. Vertebral arteries: Both vertebral arteries arise from the subclavian arteries. No proximal subclavian artery stenosis. Atheromatous plaque at the origin of the left vertebral artery with estimated 50-70% ostial stenosis. Vertebral arteries otherwise patent within the neck without stenosis, dissection or occlusion. Skeleton: No visible acute osseous finding. No worrisome osseous lesions. Moderate multilevel cervical spondylosis without high-grade spinal stenosis. Other neck: No other acute soft tissue abnormality within the neck. No mass or adenopathy. Upper chest: Visualized upper chest demonstrates no acute finding. Left-sided pacemaker/AICD partially visualized. Review of the MIP images confirms the above findings CTA HEAD FINDINGS Anterior circulation: Petrous segments patent bilaterally. Scattered atheromatous change within the carotid siphons with no more than mild multifocal stenosis. A1 segments widely patent. Normal anterior communicating artery complex. Anterior cerebral arteries patent to their distal aspects without stenosis. Normal in stenosis or occlusion. Normal MCA bifurcations. Distal MCA branches well perfused and symmetric. Posterior circulation: Both V4 segments patent to the vertebrobasilar junction without stenosis. Both PICA origins patent and normal. Basilar somewhat diminutive but patent to its distal aspect without stenosis. Superior cerebellar arteries patent bilaterally. Predominant fetal type origin of the PCAs bilaterally. Both PCAs well perfused to their distal aspects without stenosis. Venous sinuses: Grossly patent allowing for timing the contrast bolus. Anatomic variants: Predominant fetal type origin of the PCAs bilaterally. No intracranial aneurysm or other vascular abnormality. Review of the MIP images confirms the above findings IMPRESSION: CT HEAD IMPRESSION: 1. Evolving subacute left  cerebellar infarct, overall relatively stable in size and distribution from prior. Associated petechial hemorrhage without frank intraparenchymal hematoma, stable. Localized mass effect on the adjacent fourth ventricle without hydrocephalus, also unchanged. 2. No other new acute intracranial abnormality. CTA HEAD AND NECK IMPRESSION: 1. Negative CTA for large vessel occlusion. 2. Atheromatous change about the carotid bifurcations with associated stenoses of up to 40% on the right and 25% on the left. 3. Atheromatous plaque at the origin of the left vertebral artery with estimated 50-70% ostial stenosis. 4. Otherwise wide patency of the major arterial vasculature of the head and neck. No other hemodynamically significant or correctable stenosis. Electronically Signed   By: Rise Mu M.D.   On: 09/11/2020 23:02   ECHOCARDIOGRAM COMPLETE  Result Date: 09/12/2020    ECHOCARDIOGRAM REPORT   Patient Name:   KIMBER ESTERLY Date of Exam: 09/12/2020 Medical Rec #:  [id number]       Height:       69.0 in Accession #:    [id number]      Weight:  199.0 lb Date of Birth:  [birth year]       BSA:          2.062 m Patient Age:    86 years        BP:           169/67 mmHg Patient Gender: M               HR:           60 bpm. Exam Location:  [facility] Procedure: 2D Echo, Color Doppler and Cardiac Doppler Indications:     I63.9 Stroke  History:         Patient has prior history of Echocardiogram examinations, most                  recent 11/08/2019. CAD, Pacemaker, Aortic Valve Disease; Risk                  Factors:Hypertension and Dyslipidemia.  Sonographer:     Humphrey RollsJoan Heiss RDCS (AE) Referring Phys:  [id number] Alford HighlandICHARD WIETING Diagnosing Phys: Yvonne Kendallhristopher End MD  Sonographer Comments: No subcostal window. IMPRESSIONS  1. Left ventricular ejection fraction, by estimation, is 55 to 60%. The left ventricle has normal function. The left ventricle has no regional wall motion abnormalities. There is mild left ventricular  hypertrophy. Left ventricular diastolic parameters are consistent with Grade I diastolic dysfunction (impaired relaxation).  2. Right ventricular systolic function is normal. The right ventricular size is normal. Mildly increased right ventricular wall thickness.  3. Left atrial size was mildly dilated.  4. The mitral valve is degenerative. Mild to moderate mitral valve regurgitation. No evidence of mitral stenosis. Moderate mitral annular calcification.  5. The aortic valve has an indeterminant number of cusps. There is severe calcifcation of the aortic valve. There is severe thickening of the aortic valve. Aortic valve regurgitation is trivial. Moderate to severe aortic valve stenosis. Aortic valve area, by VTI measures 0.74 cm. Aortic valve mean gradient measures 38.0 mmHg. Comparison(s): A prior study was performed on 11/08/2019. Aortic stenosis has worsened and is now moderate to severe (mean gradient 31->38 mmHg, AVA 1.4->0.7 cm^2). FINDINGS  Left Ventricle: Left ventricular ejection fraction, by estimation, is 55 to 60%. The left ventricle has normal function. The left ventricle has no regional wall motion abnormalities. The left ventricular internal cavity size was normal in size. There is  mild left ventricular hypertrophy. Left ventricular diastolic parameters are consistent with Grade I diastolic dysfunction (impaired relaxation). Right Ventricle: The right ventricular size is normal. Mildly increased right ventricular wall thickness. Right ventricular systolic function is normal. Left Atrium: Left atrial size was mildly dilated. Right Atrium: Right atrial size was normal in size. Pericardium: There is no evidence of pericardial effusion. Presence of pericardial fat pad. Mitral Valve: The mitral valve is degenerative in appearance. There is mild thickening of the mitral valve leaflet(s). Moderate mitral annular calcification. Mild to moderate mitral valve regurgitation. No evidence of mitral valve  stenosis. MV peak gradient, 4.7 mmHg. The mean mitral valve gradient is 1.0 mmHg. Tricuspid Valve: The tricuspid valve is normal in structure. Tricuspid valve regurgitation is not demonstrated. Aortic Valve: The aortic valve has an indeterminant number of cusps. There is severe calcifcation of the aortic valve. There is severe thickening of the aortic valve. Aortic valve regurgitation is trivial. Moderate to severe aortic stenosis is present. Aortic valve mean gradient measures 38.0 mmHg. Aortic valve peak gradient measures 59.6 mmHg. Aortic valve area, by VTI measures 0.74 cm.  Pulmonic Valve: The pulmonic valve was not well visualized. Pulmonic valve regurgitation is not visualized. No evidence of pulmonic stenosis. Aorta: The aortic root is normal in size and structure. Pulmonary Artery: The pulmonary artery is of normal size. Venous: The inferior vena cava was not well visualized. IAS/Shunts: The interatrial septum was not well visualized. Additional Comments: A device lead is visualized in the right ventricle.  LEFT VENTRICLE PLAX 2D LVIDd:         4.50 cm  Diastology LVIDs:         2.50 cm  LV e' medial:    5.11 cm/s LV PW:         1.40 cm  LV E/e' medial:  12.7 LV IVS:        1.10 cm  LV e' lateral:   4.68 cm/s LVOT diam:     2.10 cm  LV E/e' lateral: 13.8 LV SV:         60 LV SV Index:   29 LVOT Area:     3.46 cm  RIGHT VENTRICLE RV Basal diam:  1.80 cm LEFT ATRIUM             Index       RIGHT ATRIUM           Index LA diam:        3.80 cm 1.84 cm/m  RA Area:     10.45 cm 5.07 cm/m LA Vol (A2C):   54.9 ml 26.63 ml/m LA Vol (A4C):   42.4 ml 20.57 ml/m LA Biplane Vol: 51.0 ml 24.74 ml/m  AORTIC VALVE                    PULMONIC VALVE AV Area (Vmax):    0.70 cm     PV Vmax:       1.38 m/s AV Area (Vmean):   0.68 cm     PV Vmean:      83.200 cm/s AV Area (VTI):     0.74 cm     PV VTI:        0.241 m AV Vmax:           386.00 cm/s  PV Peak grad:  7.6 mmHg AV Vmean:          265.000 cm/s PV Mean grad:   3.0 mmHg AV VTI:            0.805 m AV Peak Grad:      59.6 mmHg AV Mean Grad:      38.0 mmHg LVOT Vmax:         77.70 cm/s LVOT Vmean:        51.900 cm/s LVOT VTI:          0.173 m LVOT/AV VTI ratio: 0.21  AORTA Ao Root diam: 3.20 cm MITRAL VALVE MV Area (PHT): 2.39 cm    SHUNTS MV Area VTI:   2.04 cm    Systemic VTI:  0.17 m MV Peak grad:  4.7 mmHg    Systemic Diam: 2.10 cm MV Mean grad:  1.0 mmHg MV Vmax:       1.08 m/s MV Vmean:      56.8 cm/s MV Decel Time: 318 msec MV E velocity: 64.70 cm/s MV A velocity: 95.10 cm/s MV E/A ratio:  0.68 Cristal Deer End MD Electronically signed by Yvonne Kendall MD Signature Date/Time: 09/12/2020/1:47:39 PM    Final     Scheduled Meds:  amLODipine  5 mg Oral Daily  atorvastatin  40 mg Oral Daily   citalopram  20 mg Oral Daily   feeding supplement  237 mL Oral Q24H   heparin injection (subcutaneous)  5,000 Units Subcutaneous Q8H   memantine  5 mg Oral BID   metoprolol succinate  25 mg Oral Daily   pantoprazole (PROTONIX) IV  40 mg Intravenous Q12H   vitamin B-12  1,000 mcg Oral Daily   Continuous Infusions:   LOS: 3 days   Time spent: 40 minutes. More than 50% of the time was spent in counseling/coordination of care  Arnetha Courser, MD [facility]  If 7PM-7AM, please contact night-coverage [email or web address]  09/13/2020, 4:02 PM   This record has been created using Conservation officer, historic buildings. Errors have been sought and corrected,but may not always be located. Such creation errors do not reflect on the standard of care.

## 2020-09-13 NOTE — Plan of Care (Signed)

## 2020-09-13 NOTE — Progress Notes (Signed)
Physical Therapy Treatment Patient Details Name: Timothy DOBRATZ MRN: [id number] DOB: 09/05/1933 Today's Date: 09/13/2020    History of Present Illness 87 y/o M admitted on 09/09/20 with c/c of generalized weakness, AMS, & N&V. Initially suspected to be gastroenteritis.  On 7/25, repeat CT scan revealed subacute left cerebellar infarct with some petechial hemorrhage. PMH: aortic stenosis, borderline DM, complete heart block, heart disease, HLD, HTN, alzheimer's dementia    PT Comments    Pt very pleasant and eager to do all he could with PT.  Ultimately he did quite well and was able to perform LE exercises and ambulate ~185 ft.  He displayed some consistent ataxia (b/l LEs) and some quality of motion inconsistency t/o but did manage to make transient adjustments with cuing.  He did not have any overt LOBs but consistently had some buckling and foot placement issues during standing/ambulation that necessitated close guarding and heavy verbal cuing t/o the session. Increased frequency to daily to facilitate/maximize gains and consistency with PT here prior to d/c to rehab.    Follow Up Recommendations  SNF;Supervision/Assistance - 24 hour     Equipment Recommendations  Rolling walker with 5" wheels (TBD at next venue)    Recommendations for Other Services       Precautions / Restrictions Precautions Precautions: Fall Restrictions Weight Bearing Restrictions: No    Mobility  Bed Mobility Overal bed mobility: Needs Assistance Bed Mobility: Supine to Sit     Supine to sit: Supervision     General bed mobility comments: Pt needed some extra cuing but was able to rise w/o direct assist and with good relative safety. Maintained sitting EOB w/o assist    Transfers Overall transfer level: Needs assistance Equipment used: Rolling walker (2 wheeled) Transfers: Sit to/from Stand Sit to Stand: Min assist Stand pivot transfers: Mod assist       General transfer comment: Cuing for  hand placement and appropriatae sequencing.  Initially has some buckling that he did self correct with UEs on walker, but had no dizziness or overt LOBs.  Ambulation/Gait Ambulation/Gait assistance: Min guard;Min assist Gait Distance (Feet): 185 Feet Assistive device: Rolling walker (2 wheeled)       General Gait Details: Pt with ataxic gait t/o and did have moments of small scale buckling but did not need direct assist to maintain balance t/o the effort, though PT maintained very close CGA, nearly constant cuing for slower and more deliberate steppage/ foot placement, as well as chair follow.  With cuing pt was able to take more equal steps (initially R LE trailing/step-to) but did need consistent cues to maintain.  Pt's L foot placement was also ataxic with frequent toe out flare and lack of DF control.  Pt highly reliant on UEs but overall made great effort, maintained consistent/appropraite vitals and did better than expected given his recent limitations   Stairs             Wheelchair Mobility    Modified Rankin (Stroke Patients Only)       Balance Overall balance assessment: Needs assistance Sitting-balance support: Feet supported;No upper extremity supported Sitting balance-Leahy Scale: Fair Sitting balance - Comments: Fair static sitting balance at EOB.   Standing balance support: During functional activity;Single extremity supported Standing balance-Leahy Scale: Fair Standing balance comment: Pt needing at least one hand support during standing tasks, frequent small buckling in knees that he did manage to self arrest.  Pt with no express impusivity, but needing constant cues to insure safety  with standing tasks/balance                            Cognition Arousal/Alertness: Awake/alert Behavior During Therapy: WFL for tasks assessed/performed Overall Cognitive Status: History of cognitive impairments - at baseline                                  General Comments: pt pleasant and agreeble throughout. Appearing to mask deficits at times      Exercises General Exercises - Lower Extremity Ankle Circles/Pumps: Strengthening;10 reps Heel Slides: Strengthening;5 reps (with resisted leg ext, some decreased QofM, but good strength) Hip ABduction/ADduction: Strengthening;10 reps (again some minimal QofM defecits, but strength essentially = b/l) Straight Leg Raises: Strengthening;5 reps (cues for quad engagement before lift with light resist at apex, some inconsistentcy and need for cuing but again good overall effort and execution) Other Exercises Other Exercises: Pt engaged in exercises targeting fine motor skills of L UE, which included flipping playing cards, unscrewing bolts, and performing palm to finger translation of bolts    General Comments General comments (skin integrity, edema, etc.): Pt denying dizziness throughout mobility. HR 60s throughout session      Pertinent Vitals/Pain Pain Assessment: No/denies pain    Home Living                      Prior Function            PT Goals (current goals can now be found in the care plan section) Acute Rehab PT Goals Patient Stated Goal: go to rehab Progress towards PT goals: Progressing toward goals    Frequency    7X/week      PT Plan Frequency needs to be updated    Co-evaluation              AM-PAC PT "6 Clicks" Mobility   Outcome Measure  Help needed turning from your back to your side while in a flat bed without using bedrails?: A Little Help needed moving from lying on your back to sitting on the side of a flat bed without using bedrails?: A Little Help needed moving to and from a bed to a chair (including a wheelchair)?: A Little Help needed standing up from a chair using your arms (e.g., wheelchair or bedside chair)?: A Little Help needed to walk in hospital room?: A Little Help needed climbing 3-5 steps with a railing? : A Lot 6  Click Score: 17    End of Session Equipment Utilized During Treatment: Gait belt Activity Tolerance: Patient tolerated treatment well;Patient limited by fatigue Patient left: in chair;with call bell/phone within reach;with family/visitor present Nurse Communication: Mobility status PT Visit Diagnosis: Unsteadiness on feet (R26.81);Muscle weakness (generalized) (M62.81);Difficulty in walking, not elsewhere classified (R26.2)     Time: 5697-9480 PT Time Calculation (min) (ACUTE ONLY): 35 min  Charges:  $Gait Training: 8-22 mins $Therapeutic Exercise: 8-22 mins                     Malachi Pro, DPT 09/13/2020, 6:28 PM

## 2020-09-13 NOTE — TOC Progression Note (Addendum)
Transition of Care [facility]) - Progression Note    Patient Details  Name: Timothy Ramirez MRN: [id number] Date of Birth: [birth year]  Transition of Care [facility]) CM/SW Wilson, RN Phone Number: 09/13/2020, 2:33 PM  Clinical Narrative:     Met with the patient and let him know that [facility] has offered a bed and the plan is to DC on Friday, he stated that he prefers to go home, I explained at home PT can not work with him daily like they would at SNF< he agreed to leave it as the plan but still prefers to go home   I spoke with the son Richardson Landry and he stated that they are not able to manage him at home until he gets stronger, He stated the patient had 2 Covid vaccines and 1 booster and is agreeable to second booster it was [employer], notified the Doctor    Expected Discharge Plan and Services                                                 Social Determinants of Health (SDOH) Interventions    Readmission Risk Interventions No flowsheet data found.

## 2020-09-13 NOTE — Progress Notes (Signed)
Occupational Therapy Treatment Patient Details Name: Timothy MIMBS MRN: [id number] DOB: [birth year] Today's Date: 09/13/2020    History of present illness Pt is an 87 y/o M admitted on 09/09/20 with c/c of generalized weakness, AMS, & N&V. Initially suspected to be gastroenteritis.  On 7/25, repeat CT scan revealed subacute left cerebellar infarct with some petechial hemorrhage. PMH: aortic stenosis, borderline DM, complete heart block, heart disease, HLD, HTN, alzheimer's dementia   OT comments  Pt seen for OT treatment on this date. Since initial evaluation, pt found to have subacute left cerebellar infarct, however per neurology (via secure chat), stroke most likely present upon admission. Neurology cleared pt for OT this date. Upon arrival to room, pt awake and seated upright in bed. Pt agreeable to tx. Pt currently presents with decreased LUE coordination, decreased balance, decreased strength, and decreased activity tolerance. Due to these impairments, pt currently requires MIN A for bed mobility, MIN-MOD A for sit<>stand transfers with RW, and MIN A for steadying while standing to use urinal. This date, pt engaged in exercises targeting fine motor skills of L UE, which included flipping playing cards, unscrewing nuts/bolts, and performing palm to finger translation of bolts. Family at bedside reporting that pt enjoys playing blackjack and will bring pt's cards if able. Pt is making good progress toward goals and continues to benefit from skilled OT services to maximize return to PLOF and minimize risk of future falls, injury, caregiver burden, and readmission. Will continue to follow POC. Discharge recommendation remains appropriate.     Follow Up Recommendations  SNF    Equipment Recommendations  Other (comment) (defer to next venue of care)       Precautions / Restrictions Precautions Precautions: Fall Restrictions Weight Bearing Restrictions: No       Mobility Bed Mobility Overal  bed mobility: Needs Assistance Bed Mobility: Supine to Sit     Supine to sit: Min assist;HOB elevated     General bed mobility comments: assistance to upright trunk    Transfers Overall transfer level: Needs assistance Equipment used: Rolling walker (2 wheeled) Transfers: Sit to/from [employer] Sit to Stand: Mod assist;Min assist Stand pivot transfers: Mod assist       General transfer comment: cuing for hand placement. MOD A required during initial sit>stand attempt. Able to progress to MIN A from recliner with use of armrests    Balance Overall balance assessment: Needs assistance Sitting-balance support: Feet supported;No upper extremity supported Sitting balance-Leahy Scale: Fair Sitting balance - Comments: Fair static sitting balance at EOB.   Standing balance support: During functional activity;Single extremity supported Standing balance-Leahy Scale: Poor Standing balance comment: with MIN A, pt able to maintain standing balance with unilateral UE support from RW while using urinal                           ADL either performed or assessed with clinical judgement   ADL Overall ADL's : Needs assistance/impaired     Grooming: Wash/dry hands;Supervision/safety;Set up;Sitting                       Toileting- Clothing Manipulation and Hygiene: Minimal assistance;Sit to/from stand Toileting - Clothing Manipulation Details (indicate cue type and reason): MIN A for steadying while standing to use urinal     Functional mobility during ADLs: Rolling walker;Moderate assistance (MOD A for stand pivot transfer)        Cognition Arousal/Alertness: Awake/alert Behavior During  Therapy: WFL for tasks assessed/performed Overall Cognitive Status: History of cognitive impairments - at baseline                                 General Comments: pt pleasant and agreeble throughout. Appearing to mask deficits at times         Exercises Other Exercises Other Exercises: Pt engaged in exercises targeting fine motor skills of L UE, which included flipping playing cards, unscrewing bolts, and performing palm to finger translation of bolts      General Comments Pt denying dizziness throughout mobility. HR 60s throughout session    Pertinent Vitals/ Pain       Pain Assessment: No/denies pain         Frequency  Min 1X/week        Progress Toward Goals  OT Goals(current goals can now be found in the care plan section)  Progress towards OT goals: Progressing toward goals  Acute Rehab OT Goals Patient Stated Goal: go to rehab OT Goal Formulation: With family Time For Goal Achievement: 09/25/20 Potential to Achieve Goals: Good  Plan Discharge plan remains appropriate;Frequency remains appropriate       AM-PAC OT "6 Clicks" Daily Activity     Outcome Measure   Help from another person eating meals?: None Help from another person taking care of personal grooming?: A Little Help from another person toileting, which includes using toliet, bedpan, or urinal?: A Lot Help from another person bathing (including washing, rinsing, drying)?: A Lot Help from another person to put on and taking off regular upper body clothing?: A Little Help from another person to put on and taking off regular lower body clothing?: A Little 6 Click Score: 17    End of Session Equipment Utilized During Treatment: Gait belt;Rolling walker  OT Visit Diagnosis: Muscle weakness (generalized) (M62.81)   Activity Tolerance Patient tolerated treatment well   Patient Left in chair;with call bell/phone within reach;with chair alarm set;with family/visitor present   Nurse Communication Mobility status        Time: 1430-1505 OT Time Calculation (min): 35 min  Charges: OT Treatments $Self Care/Home Management : 23-37 mins  Matthew Folks, OTR/L [facility] [phone]

## 2020-09-13 NOTE — TOC Progression Note (Signed)
Transition of Care [facility]) - Progression Note    Patient Details  Name: Timothy Ramirez MRN: [id number] Date of Birth: [birth year]  Transition of Care [facility]) CM/SW Contact  Barrie Dunker, RN Phone Number: 09/13/2020, 9:04 AM  Clinical Narrative:     The patient's family reached out to me asking that they be put on a list to get a bed at [facility], I reached out to [place] at [facility] and requested that they let me know of the next bed available and if the patient can be on a list to get the next bed if they can offer.  Awaiting a response, the family stated that they would go to the next available if they can not get into [facility]        Expected Discharge Plan and Services                                                 Social Determinants of Health (SDOH) Interventions    Readmission Risk Interventions No flowsheet data found.

## 2020-09-13 NOTE — TOC Progression Note (Signed)
Transition of Care [facility]) - Progression Note    Patient Details  Name: Timothy Ramirez MRN: [id number] Date of Birth: 1933/08/30  [facility] CM/SW Contact  Barrie Dunker, RN Phone Number: 09/13/2020, 1:51 PM  Clinical Narrative:     Verlon Au with [facility] stated that they could offer a bed but not until Friday       Expected Discharge Plan and Services                                                 Social Determinants of Health (SDOH) Interventions    Readmission Risk Interventions No flowsheet data found.

## 2020-09-14 DIAGNOSIS — R531 Weakness: Secondary | ICD-10-CM | POA: Diagnosis not present

## 2020-09-14 NOTE — Progress Notes (Signed)
PT Cancellation Note  Patient Details Name: Timothy Ramirez MRN: [id number] DOB: 08/26/1933   Cancelled Treatment:    Reason Eval/Treat Not Completed: Other (comment) (patient declined) Therapist attempted x 2. Patient stating both times that he just wants to rest. PT will continue with attempts as appropriate.   Donna Bernard, PT, MPT  Ina Homes 09/14/2020, 12:57 PM

## 2020-09-14 NOTE — Care Management Important Message (Signed)
Important Message  Patient Details  Name: PARK BECK MRN: [id number] Date of Birth: 1933/04/27   Medicare Important Message Given:  Yes     Olegario Messier A Anella Nakata 09/14/2020, 10:16 AM

## 2020-09-14 NOTE — Progress Notes (Signed)
PROGRESS NOTE    Timothy Ramirez  BHA:[id number] DOB: 16-Sep-1933 DOA: 09/09/2020 PCP: Jerl Mina, MD   Brief Narrative: Taken from prior notes. Patient admitted 09/09/2020 with generalized weakness altered mental status and nausea vomiting.  Initially suspected to be gastroenteritis.  Found to have some impairment on finger nose test on 09/11/2020 and CT scanning was repeated which shows a subacute left cerebellar infarct with some petechial hemorrhage.  Neurology was consulted.  Initially antiplatelets and DVT prophylaxis was held for concern of hemorrhagic conversion.  Later neurology cleared to start DVT prophylaxis. History of recent pacemaker placement which was interrogated at neurology result and did not show any A. Fib. Neurology is recommending starting low-dose aspirin after better control of blood pressure. PT is recommending SNF placement-we will be able to go on Friday per TOC. Ordered second booster of Pfizer vaccine at family's request.  Repeat COVID test for SNF ordered today.  Subjective: Patient had no new complaint today.  Daughter-in-law at bedside.  Eating and drinking improving.  Assessment & Plan:   Principal Problem:   Weakness Active Problems:   Severe aortic stenosis   Essential hypertension   Elevated troponin   Diabetes mellitus type 2, uncomplicated (HCC)   CAD in native artery   Nausea & vomiting   Gastroenteritis   Acute stroke due to ischemia [facility])   Depression   Memory loss   Stenosis of left vertebral artery  Left cerebellum stroke with petechial hemorrhage.  Currently stable and improving. Neurology is on board and recommending interrogation of his recently placed pacemaker to rule out A. Fib.  Per cardiology pacemaker was interrogated and did not found any A. fib. CT angio shows left vertebral stenosis 50 to 70%.  Telemetry monitoring shows AV paced with PVCs.  Echocardiogram shows progression of aortic stenosis moderate to severe.  LDL  47. -PT is recommending SNF placement-had bed offer for Friday. -Neurology is recommending starting aspirin  -Continue with statin  Essential hypertension.  Blood pressure mildly elevated. -Continue Norvasc and Toprol.  Moderate to severe aortic stenosis.  Cardiology was consulted and they are recommending outpatient follow-up, patient might need TAVR in the future.  AKI.  Most likely secondary to dehydration with nausea and vomiting and poor p.o. intake. Renal function improved with IV fluid  Positive troponin.  Most likely secondary to demand ischemia.  No chest pain, flat and improving curve.  Depression. -Continue Celexa  Advanced dementia. -Continue home dose of Namenda. -Delirium precautions  Impaired fasting glucose.  A1c of 6.4 which makes him prediabetic. -Continue to monitor  Objective: Vitals:   09/14/20 0521 09/14/20 0747 09/14/20 1220 09/14/20 1530  BP: (!) 155/80 136/70 (!) 148/75 (!) 142/60  Pulse: (!) 59 60 60 60  Resp: 19 16 15 15   Temp: 97.7 F (36.5 C) 98.6 F (37 C) 98.3 F (36.8 C) 98.2 F (36.8 C)  TempSrc: Oral     SpO2: 96% 93% 94% 93%  Weight:      Height:       No intake or output data in the 24 hours ending 09/14/20 1643  Filed Weights   09/09/20 1534  Weight: 90.3 kg    Examination:  General.  Well-developed elderly man, in no acute distress. Pulmonary.  Lungs clear bilaterally, normal respiratory effort. CV.  Regular rate and rhythm, no JVD, rub or murmur. Abdomen.  Soft, nontender, nondistended, BS positive. CNS.  Alert and oriented .  No focal neurologic deficit. Extremities.  No edema, no cyanosis, pulses  intact and symmetrical. Psychiatry.  Judgment and insight appears normal.   DVT prophylaxis: Heparin Code Status: DNR Family Communication: Daughter-in-law was updated at bedside. Disposition Plan:  Status is: Inpatient  Remains inpatient appropriate because:Inpatient level of care appropriate due to severity of  illness  Dispo: The patient is from: Home              Anticipated d/c is to: SNF              Patient currently is medically stable to d/c.   Difficult to place patient No               Level of care: Med-Surg  All the records are reviewed and case discussed with Care Management/Social Worker. Management plans discussed with the patient, nursing and they are in agreement.  Consultants:  Neurology  Procedures:  Antimicrobials:   Data Reviewed: I have personally reviewed following labs and imaging studies  CBC: Recent Labs  Lab 09/09/20 1548 09/10/20 0651 09/11/20 2132  WBC 11.6* 9.5  --   HGB 17.1* 15.2  --   HCT 47.8 43.2 42.1  MCV 87.9 87.6  --   PLT 142* 128*  --     Basic Metabolic Panel: Recent Labs  Lab 09/09/20 1548 09/09/20 2130 09/10/20 0651 09/12/20 0522 09/13/20 0819  NA 137  --  138 138 137  K 4.1  --  3.7 3.4* 3.9  CL 106  --  108 104 104  CO2 20*  --  24 25 25   GLUCOSE 184*  --  131* 120* 124*  BUN 24*  --  21 24* 21  CREATININE 1.30*  --  1.04 1.07 1.09  CALCIUM 9.3  --  8.6* 8.4* 8.6*  MG  --  1.9  --   --   --     GFR: Estimated Creatinine Clearance: 56 mL/min (by C-G formula based on SCr of 1.09 mg/dL). Liver Function Tests: Recent Labs  Lab 09/09/20 1548 09/10/20 0651 09/13/20 0819  AST 24 18 26   ALT 22 16 21   ALKPHOS 89 77 86  BILITOT 2.1* 2.1* 2.4*  PROT 7.2 5.9* 6.2*  ALBUMIN 3.8 3.3* 3.2*    Recent Labs  Lab 09/09/20 2130  LIPASE 19    No results for input(s): AMMONIA in the last 168 hours. Coagulation Profile: No results for input(s): INR, PROTIME in the last 168 hours. Cardiac Enzymes: Recent Labs  Lab 09/09/20 2130  CKTOTAL 55    BNP (last 3 results) No results for input(s): PROBNP in the last 8760 hours. HbA1C: No results for input(s): HGBA1C in the last 72 hours. CBG: Recent Labs  Lab 09/13/20 2134  GLUCAP 164*   Lipid Profile: Recent Labs    09/12/20 0522  CHOL 94  HDL 29*  LDLCALC 47   TRIG 92  CHOLHDL 3.2    Thyroid Function Tests: No results for input(s): TSH, T4TOTAL, FREET4, T3FREE, THYROIDAB in the last 72 hours. Anemia Panel: No results for input(s): VITAMINB12, FOLATE, FERRITIN, TIBC, IRON, RETICCTPCT in the last 72 hours. Sepsis Labs: No results for input(s): PROCALCITON, LATICACIDVEN in the last 168 hours.  Recent Results (from the past 240 hour(s))  Resp Panel by RT-PCR (Flu A&B, Covid) Nasopharyngeal Swab     Status: None   Collection Time: 09/09/20  5:53 PM   Specimen: Nasopharyngeal Swab; Nasopharyngeal(NP) swabs in vial transport medium  Result Value Ref Range Status   SARS Coronavirus 2 by RT PCR NEGATIVE NEGATIVE Final  Comment: (NOTE) SARS-CoV-2 target nucleic acids are NOT DETECTED.  The SARS-CoV-2 RNA is generally detectable in upper respiratory specimens during the acute phase of infection. The lowest concentration of SARS-CoV-2 viral copies this assay can detect is 138 copies/mL. A negative result does not preclude SARS-Cov-2 infection and should not be used as the sole basis for treatment or other patient management decisions. A negative result may occur with  improper specimen collection/handling, submission of specimen other than nasopharyngeal swab, presence of viral mutation(s) within the areas targeted by this assay, and inadequate number of viral copies(<138 copies/mL). A negative result must be combined with clinical observations, patient history, and epidemiological information. The expected result is Negative.  Fact Sheet for Patients:  BloggerCourse.com  Fact Sheet for Healthcare Providers:  SeriousBroker.it  This test is no t yet approved or cleared by the [place] FDA and  has been authorized for detection and/or diagnosis of SARS-CoV-2 by FDA under an Emergency Use Authorization (EUA). This EUA will remain  in effect (meaning this test can be used) for the duration  of the COVID-19 declaration under Section 564(b)(1) of the Act, 21 U.S.C.section 360bbb-3(b)(1), unless the authorization is terminated  or revoked sooner.       Influenza A by PCR NEGATIVE NEGATIVE Final   Influenza B by PCR NEGATIVE NEGATIVE Final    Comment: (NOTE) The Xpert Xpress SARS-CoV-2/FLU/RSV plus assay is intended as an aid in the diagnosis of influenza from Nasopharyngeal swab specimens and should not be used as a sole basis for treatment. Nasal washings and aspirates are unacceptable for Xpert Xpress SARS-CoV-2/FLU/RSV testing.  Fact Sheet for Patients: BloggerCourse.com  Fact Sheet for Healthcare Providers: SeriousBroker.it  This test is not yet approved or cleared by the [place] FDA and has been authorized for detection and/or diagnosis of SARS-CoV-2 by FDA under an Emergency Use Authorization (EUA). This EUA will remain in effect (meaning this test can be used) for the duration of the COVID-19 declaration under Section 564(b)(1) of the Act, 21 U.S.C. section 360bbb-3(b)(1), unless the authorization is terminated or revoked.  Performed at [facility], [address] [place], [place] [address]       Radiology Studies: No results found.  Scheduled Meds:  amLODipine  5 mg Oral Daily   aspirin EC  81 mg Oral Daily   atorvastatin  40 mg Oral Daily   citalopram  20 mg Oral Daily   feeding supplement  237 mL Oral Q24H   heparin injection (subcutaneous)  5,000 Units Subcutaneous Q8H   memantine  5 mg Oral BID   metoprolol succinate  25 mg Oral Daily   pantoprazole (PROTONIX) IV  40 mg Intravenous Q12H   polyethylene glycol  17 g Oral Daily   vitamin B-12  1,000 mcg Oral Daily   Continuous Infusions:   LOS: 4 days   Time spent: 36 minutes. More than 50% of the time was spent in counseling/coordination of care  Arnetha Courser, MD [facility]  If 7PM-7AM, please contact  night-coverage [email or web address]  09/14/2020, 4:43 PM   This record has been created using Conservation officer, historic buildings. Errors have been sought and corrected,but may not always be located. Such creation errors do not reflect on the standard of care.

## 2020-09-15 DIAGNOSIS — R531 Weakness: Secondary | ICD-10-CM | POA: Diagnosis not present

## 2020-09-15 LAB — SARS CORONAVIRUS 2 (TAT 6-24 HRS): SARS Coronavirus 2: NEGATIVE

## 2020-09-15 LAB — CBC
HCT: 44.2 % (ref 39.0–52.0)
Hemoglobin: 16 g/dL (ref 13.0–17.0)
MCH: 31.4 pg (ref 26.0–34.0)
MCHC: 36.2 g/dL — ABNORMAL HIGH (ref 30.0–36.0)
MCV: 86.8 fL (ref 80.0–100.0)
Platelets: 130 K/uL — ABNORMAL LOW (ref 150–400)
RBC: 5.09 MIL/uL (ref 4.22–5.81)
RDW: 13 % (ref 11.5–15.5)
WBC: 7.8 K/uL (ref 4.0–10.5)
nRBC: 0 % (ref 0.0–0.2)

## 2020-09-15 LAB — COMPREHENSIVE METABOLIC PANEL
ALT: 34 U/L (ref 0–44)
AST: 32 U/L (ref 15–41)
Albumin: 3.2 g/dL — ABNORMAL LOW (ref 3.5–5.0)
Alkaline Phosphatase: 92 U/L (ref 38–126)
Anion gap: 6 (ref 5–15)
BUN: 20 mg/dL (ref 8–23)
CO2: 25 mmol/L (ref 22–32)
Calcium: 8.7 mg/dL — ABNORMAL LOW (ref 8.9–10.3)
Chloride: 105 mmol/L (ref 98–111)
Creatinine, Ser: 1.05 mg/dL (ref 0.61–1.24)
GFR, Estimated: >60 mL/min (ref >60)
Glucose, Bld: 116 mg/dL — ABNORMAL HIGH (ref 70–99)
Potassium: 3.6 mmol/L (ref 3.5–5.1)
Sodium: 136 mmol/L (ref 135–145)
Total Bilirubin: 2.6 mg/dL — ABNORMAL HIGH (ref 0.3–1.2)
Total Protein: 6.3 g/dL — ABNORMAL LOW (ref 6.5–8.1)

## 2020-09-15 LAB — PROTIME-INR
INR: 1.1 (ref 0.8–1.2)
Prothrombin Time: 14.5 s (ref 11.4–15.2)

## 2020-09-15 MED ORDER — BISACODYL 10 MG RE SUPP
10.0000 mg | Freq: Once | RECTAL | Status: AC
  Administered 2020-09-15: 10 mg via RECTAL
  Filled 2020-09-15: qty 1

## 2020-09-15 MED ORDER — POLYETHYLENE GLYCOL 3350 17 G PO PACK
17.0000 g | PACK | Freq: Every day | ORAL | 0 refills | Status: DC | PRN
Start: 2020-09-15 — End: 2023-07-28

## 2020-09-15 MED ORDER — METOPROLOL SUCCINATE ER 25 MG PO TB24: 25.0000 mg | ORAL_TABLET | Freq: Every day | ORAL | Status: DC

## 2020-09-15 MED ORDER — ESOMEPRAZOLE MAGNESIUM 20 MG PO CPDR: 20.0000 mg | DELAYED_RELEASE_CAPSULE | Freq: Every day | ORAL | 1 refills | Status: AC

## 2020-09-15 MED ORDER — ENSURE ENLIVE PO LIQD: 237.0000 mL | ORAL | 12 refills | Status: DC

## 2020-09-15 MED ORDER — AMLODIPINE BESYLATE 5 MG PO TABS: 5.0000 mg | ORAL_TABLET | Freq: Every day | ORAL | Status: DC

## 2020-09-15 MED ORDER — TEMAZEPAM 15 MG PO CAPS: 15.0000 mg | ORAL_CAPSULE | Freq: Every evening | ORAL | 0 refills | Status: DC | PRN

## 2020-09-15 NOTE — Discharge Summary (Addendum)
Physician Discharge Summary  Timothy Ramirez:[id number] DOB: 1933/05/30 DOA: 09/09/2020  PCP: Jerl Mina, MD  Admit date: 09/09/2020 Discharge date: 09/15/2020  Admitted From: Home Disposition: SNF  Recommendations for Outpatient Follow-up:  Follow up with PCP in 1-2 weeks Follow-up with neurology Follow-up with cardiology. Follow-up with gastroenterology if T bili remained elevated. Please obtain CMP/CBC in one week Please follow up on the following pending results: None  Home Health: No Equipment/Devices: Rolling walker Discharge Condition: Stable CODE STATUS: DNR Diet recommendation: Heart Healthy / Carb Modified   Brief/Interim Summary:  Timothy Ramirez is a 87 y.o. male with past medical history significant of aortic stenosis, borderline diabetes, complete heart block, hyperlipidemia and hypertension was admitted with complaints of generalized weakness and nausea and vomiting.  Initially suspected to be gastroenteritis.  Found to have some impairment on finger nose test on 09/11/2020 and CT scanning was repeated which shows a subacute left cerebellar infarct with some petechial hemorrhage.  Neurology was consulted.  Initially antiplatelets and DVT prophylaxis was held for concern of hemorrhagic conversion.  Later neurology cleared to start DVT prophylaxis. History of recent pacemaker placement which was interrogated at neurology result and did not show any A. Fib.  He was started on low-dose aspirin after blood pressure control as recommended by neurology. CT angio shows left vertebral stenosis 50 to 70%.  Telemetry monitoring shows AV paced with PVCs.  Echocardiogram shows progression of aortic stenosis moderate to severe.  LDL 47.  A1c of 6.4 which makes him prediabetic. However PT evaluated him and recommended SNF placement for rehab.  Patient also has moderate to severe aortic stenosis, cardiology was consulted and they are recommending outpatient follow-up, he might need  TAVR in the future.  Patient also found to have mildly elevated T bili, normal liver enzymes, INR and CBC, will need a follow-up CMP in 1 week and if continue to have elevated T bili then he might need outpatient GI evaluation.  Patient received second booster dose of Pfizer while in the hospital.  Will continue with rest of his home medications and follow-up with his provider.  Discharge Diagnoses:  Principal Problem:   Weakness Active Problems:   Severe aortic stenosis   Essential hypertension   Elevated troponin   Diabetes mellitus type 2, uncomplicated (HCC)   CAD in native artery   Nausea & vomiting   Gastroenteritis   Acute stroke due to ischemia [facility])   Depression   Memory loss   Stenosis of left vertebral artery   Discharge Instructions  Discharge Instructions     Diet - low sodium heart healthy   Complete by: As directed    Increase activity slowly   Complete by: As directed       Allergies as of 09/15/2020   No Known Allergies      Medication List     TAKE these medications    amLODipine 5 MG tablet Commonly known as: NORVASC Take 1 tablet (5 mg total) by mouth daily.   aspirin EC 81 MG tablet Take 81 mg by mouth daily. Swallow whole.   atorvastatin 20 MG tablet Commonly known as: LIPITOR Take 20 mg by mouth daily.   citalopram 20 MG tablet Commonly known as: CELEXA Take 20 mg by mouth daily.   D3-1000 PO Take 1 tablet by mouth daily.   esomeprazole 20 MG capsule Commonly known as: NexIUM Take 1 capsule (20 mg total) by mouth daily.   feeding supplement Liqd Take 237 mLs by  mouth daily.   memantine 5 MG tablet Commonly known as: NAMENDA Take 5 mg by mouth 2 (two) times daily.   metoprolol succinate 25 MG 24 hr tablet Commonly known as: TOPROL-XL Take 1 tablet (25 mg total) by mouth daily.   polyethylene glycol 17 g packet Commonly known as: MIRALAX / GLYCOLAX Take 17 g by mouth daily as needed for mild constipation or moderate  constipation.   temazepam 15 MG capsule Commonly known as: RESTORIL Take 1 capsule (15 mg total) by mouth at bedtime as needed for sleep. What changed:  medication strength how much to take when to take this reasons to take this   vitamin B-12 1000 MCG tablet Commonly known as: CYANOCOBALAMIN Take 1,000 mcg by mouth daily.        Contact information for follow-up providers     Jerl Mina, MD. Schedule an appointment as soon as possible for a visit in 1 week(s).   Specialty: Family Medicine Contact information: [address] [place] [address] [phone]         Yvonne Kendall, MD .   Specialty: Cardiology Contact information: [address] [place] [address] [phone]              Contact information for after-discharge care     Destination     [facility] .   Service: Skilled Nursing Contact information: [address] [place] [address] [phone]                    No Known Allergies  Consultations: Neurology  Procedures/Studies: CT ANGIO HEAD NECK W WO CM  Result Date: 09/11/2020 CLINICAL DATA:  Initial evaluation for acute stroke. EXAM: CT ANGIOGRAPHY HEAD AND NECK TECHNIQUE: Multidetector CT imaging of the head and neck was performed using the standard protocol during bolus administration of intravenous contrast. Multiplanar CT image reconstructions and MIPs were obtained to evaluate the vascular anatomy. Carotid stenosis measurements (when applicable) are obtained utilizing NASCET criteria, using the distal internal carotid diameter as the denominator. CONTRAST:  45mL OMNIPAQUE IOHEXOL 350 MG/ML SOLN COMPARISON:  Head CT from earlier the same day. FINDINGS: CT HEAD FINDINGS Brain: Evolving subacute left cerebellar infarct again seen, relatively stable in size and distribution from prior. Associated edema is  slightly more hypodense and well-defined as compared to prior. Evidence for associated petechial hemorrhage without frank intraparenchymal hematoma, unchanged. Mild mass effect on the adjacent fourth ventricle again noted, stable. No hydrocephalus or trapping. No other new acute intracranial abnormality. Underlying atrophy with chronic small vessel ischemic disease again noted. Chronic lacunar infarcts noted at the right thalamus. Additional chronic left cerebellar infarct noted. No mass lesion or midline shift. No extra-axial fluid collection. Vascular: No hyperdense vessel. Scattered vascular calcifications noted within the carotid siphons. Skull: Scalp soft tissues and calvarium within normal limits. Sinuses: Mild mucosal thickening with a few scattered retention cyst noted within the spine paranasal sinuses. No mastoid effusion. Orbits: Globes orbital soft tissues demonstrate no acute finding. Review of the MIP images confirms the above findings CTA NECK FINDINGS Aortic arch: Visualized aortic arch normal in caliber with normal 3 vessel morphology. Mild atheromatous change about the arch and origin of the great vessels without hemodynamically significant stenosis. Right carotid system: Right CCA tortuous proximally but is widely patent to the bifurcation. Eccentric calcified plaque at the right carotid bulb/proximal right ICA with associated stenosis of up to 40%  by NASCET criteria. Right ICA patent distally without stenosis, dissection or occlusion. Left carotid system: Left CCA mildly tortuous but is widely patent to the bifurcation without stenosis. Bulky calcified plaque about the left carotid bulb/proximal left ICA with no more than mild 25% stenosis by NASCET criteria. Left ICA patent distally without stenosis, dissection or occlusion. Vertebral arteries: Both vertebral arteries arise from the subclavian arteries. No proximal subclavian artery stenosis. Atheromatous plaque at the origin of the left  vertebral artery with estimated 50-70% ostial stenosis. Vertebral arteries otherwise patent within the neck without stenosis, dissection or occlusion. Skeleton: No visible acute osseous finding. No worrisome osseous lesions. Moderate multilevel cervical spondylosis without high-grade spinal stenosis. Other neck: No other acute soft tissue abnormality within the neck. No mass or adenopathy. Upper chest: Visualized upper chest demonstrates no acute finding. Left-sided pacemaker/AICD partially visualized. Review of the MIP images confirms the above findings CTA HEAD FINDINGS Anterior circulation: Petrous segments patent bilaterally. Scattered atheromatous change within the carotid siphons with no more than mild multifocal stenosis. A1 segments widely patent. Normal anterior communicating artery complex. Anterior cerebral arteries patent to their distal aspects without stenosis. Normal in stenosis or occlusion. Normal MCA bifurcations. Distal MCA branches well perfused and symmetric. Posterior circulation: Both V4 segments patent to the vertebrobasilar junction without stenosis. Both PICA origins patent and normal. Basilar somewhat diminutive but patent to its distal aspect without stenosis. Superior cerebellar arteries patent bilaterally. Predominant fetal type origin of the PCAs bilaterally. Both PCAs well perfused to their distal aspects without stenosis. Venous sinuses: Grossly patent allowing for timing the contrast bolus. Anatomic variants: Predominant fetal type origin of the PCAs bilaterally. No intracranial aneurysm or other vascular abnormality. Review of the MIP images confirms the above findings IMPRESSION: CT HEAD IMPRESSION: 1. Evolving subacute left cerebellar infarct, overall relatively stable in size and distribution from prior. Associated petechial hemorrhage without frank intraparenchymal hematoma, stable. Localized mass effect on the adjacent fourth ventricle without hydrocephalus, also unchanged. 2.  No other new acute intracranial abnormality. CTA HEAD AND NECK IMPRESSION: 1. Negative CTA for large vessel occlusion. 2. Atheromatous change about the carotid bifurcations with associated stenoses of up to 40% on the right and 25% on the left. 3. Atheromatous plaque at the origin of the left vertebral artery with estimated 50-70% ostial stenosis. 4. Otherwise wide patency of the major arterial vasculature of the head and neck. No other hemodynamically significant or correctable stenosis. Electronically Signed   By: Rise Mu M.D.   On: 09/11/2020 23:02   DG Chest 2 View  Result Date: 09/11/2020 CLINICAL DATA:  87 year old male with a history shortness of breath and hypertension EXAM: CHEST - 2 VIEW COMPARISON:  09/09/2020 FINDINGS: Cardiomediastinal silhouette unchanged in size and contour. Improved inspiration and lung volumes, with persisting linear opacities at the bilateral lung bases. No pleural effusion pneumothorax or new confluent airspace disease. No interlobular septal thickening. No central vascular congestion. Cardiac pacer on the left chest wall. Degenerative changes of the spine.  No acute displaced fracture IMPRESSION: Improved inspiratory effort, with likely background of chronic changes and linear atelectasis. Unchanged cardiac pacing device Electronically Signed   By: Gilmer Mor D.O.   On: 09/11/2020 08:06   DG Chest 2 View  Result Date: 09/09/2020 CLINICAL DATA:  Vomiting and lethargy. EXAM: CHEST - 2 VIEW COMPARISON:  Chest radiograph dated 07/12/2020. FINDINGS: The heart size is normal. Vascular calcifications are seen in the aortic arch. The lung volumes are low. There is mild bibasilar  atelectasis. There is no pleural effusion or pneumothorax. Degenerative changes are seen in the spine. A left subclavian approach cardiac device is redemonstrated. IMPRESSION: Low lung volumes and mild bibasilar atelectasis. Aortic Atherosclerosis (ICD10-I70.0). Electronically Signed   By:  Romona Curls M.D.   On: 09/09/2020 19:06   CT HEAD WO CONTRAST  Result Date: 09/11/2020 CLINICAL DATA:  Nausea, vomiting, neurological deficit, acute, stroke suspected. EXAM: CT HEAD WITHOUT CONTRAST TECHNIQUE: Contiguous axial images were obtained from the base of the skull through the vertex without intravenous contrast. COMPARISON:  09/09/2020.  MRI 06/06/2020. FINDINGS: Brain: There is subacute infarction within the cerebellum on the left measuring about 3.5 cm in size. Since the previous study, there is some internal petechial bleeding. Mild mass-effect upon the fourth ventricle but no ventricular obstruction Old small vessel infarctions in the right cerebellum are unchanged. Old peripheral left cerebellar infarction is unchanged. Old small vessel infarctions in the right thalamus are unchanged. Chronic small-vessel ischemic change of the cerebral hemispheric white matter are unchanged. No hydrocephalus.  No extra-axial collection. Vascular: There is atherosclerotic calcification of the major vessels at the base of the brain. Skull: Negative Sinuses/Orbits: Clear/normal Other: None IMPRESSION: Subacute infarction in the left cerebellum measuring about 3.5 cm in size. Since the previous study, petechial blood products are present in this area now. No discrete hematoma however. Mild mass-effect upon the fourth ventricle but no ventricular obstruction. No other acute intracranial finding. Old infarctions in the cerebellum, right thalamus and cerebral hemispheric white matter. Electronically Signed   By: Paulina Fusi M.D.   On: 09/11/2020 15:43   CT Head Wo Contrast  Result Date: 09/09/2020 CLINICAL DATA:  Mental status change, unknown cause EXAM: CT HEAD WITHOUT CONTRAST TECHNIQUE: Contiguous axial images were obtained from the base of the skull through the vertex without intravenous contrast. COMPARISON:  Jun 22, 2009; June 06, 2020 FINDINGS: Brain: No evidence of acute infarction, hemorrhage,  hydrocephalus, extra-axial collection or mass lesion/mass effect. Remote LEFT cerebellar infarction. Periventricular white matter hypodensities consistent with sequela of chronic microvascular ischemic disease. Global parenchymal volume loss Vascular: Vascular calcifications. Skull: Normal. Negative for fracture or focal lesion. Sinuses/Orbits: No acute finding. Other: None. IMPRESSION: No acute intracranial abnormality. Electronically Signed   By: Meda Klinefelter MD   On: 09/09/2020 18:19   NM Pulmonary Perfusion  Result Date: 09/11/2020 CLINICAL DATA:  PE suspected, high probability; altered mental status/positive D-dimer. EXAM: NUCLEAR MEDICINE PERFUSION LUNG SCAN TECHNIQUE: Perfusion images were obtained in multiple projections after intravenous injection of radiopharmaceutical. Ventilation scans intentionally deferred if perfusion scan and chest x-ray adequate for interpretation during COVID 19 epidemic. RADIOPHARMACEUTICALS:  4.25 mCi Tc-85m MAA IV COMPARISON:  Chest x-ray today FINDINGS: No wedge-shaped perfusion defects seen to suggest pulmonary embolus. IMPRESSION: No evidence of pulmonary embolus. Electronically Signed   By: Charlett Nose M.D.   On: 09/11/2020 12:15   CT ABDOMEN PELVIS W CONTRAST  Result Date: 09/09/2020 CLINICAL DATA:  Nausea/vomiting Bowel obstruction suspected EXAM: CT ABDOMEN AND PELVIS WITH CONTRAST TECHNIQUE: Multidetector CT imaging of the abdomen and pelvis was performed using the standard protocol following bolus administration of intravenous contrast. CONTRAST:  OMNIPAQUE IOHEXOL 350 MG/ML SOLN COMPARISON:  November 08, 2019 FINDINGS: Lower chest: Bibasilar atelectasis.  Unchanged cardiomegaly. Hepatobiliary: Status post cholecystectomy. Unchanged dilation of the common bile duct to approximately 11 mm, likely due to post cholecystectomy state. Mild intrahepatic biliary prominence, unchanged. No new hepatic focal lesion. Portal vein is patent. Pancreas: Atrophic  in appearance. No  pancreatic ductal dilatation or surrounding inflammatory changes. Spleen: Normal in size without focal abnormality. Adrenals/Urinary Tract: Adrenal glands are unremarkable. LEFT renal cyst. No hydronephrosis. Kidneys enhance symmetrically. Bladder is unremarkable. Stomach/Bowel: Moderate hiatal hernia. No evidence of bowel obstruction. Diverticulosis of the sigmoid and descending colon. No evidence of acute diverticulitis. Moderate colonic stool burden diffusely throughout the colon. Appendix is normal. Vascular/Lymphatic: Atherosclerotic calcifications of the nonaneurysmal aorta. No suspicious lymphadenopathy. Reproductive: Prostate is present. Other: Fat containing RIGHT inguinal hernia. No free air or free fluid. Musculoskeletal: Degenerative changes of the lumbar spine. IMPRESSION: 1. No CT etiology for acute abdominal pain identified. Aortic Atherosclerosis (ICD10-I70.0). Electronically Signed   By: Meda Klinefelter MD   On: 09/09/2020 18:27   ECHOCARDIOGRAM COMPLETE  Result Date: 09/12/2020    ECHOCARDIOGRAM REPORT   Patient Name:   HAWKIN CHARO Date of Exam: 09/12/2020 Medical Rec #:  [id number]       Height:       69.0 in Accession #:    [id number]      Weight:       199.0 lb Date of Birth:  [birth year]       BSA:          2.062 m Patient Age:    86 years        BP:           169/67 mmHg Patient Gender: M               HR:           60 bpm. Exam Location:  [facility] Procedure: 2D Echo, Color Doppler and Cardiac Doppler Indications:     I63.9 Stroke  History:         Patient has prior history of Echocardiogram examinations, most                  recent 11/08/2019. CAD, Pacemaker, Aortic Valve Disease; Risk                  Factors:Hypertension and Dyslipidemia.  Sonographer:     Humphrey Rolls RDCS (AE) Referring Phys:  [id number] Alford Highland Diagnosing Phys: Yvonne Kendall MD  Sonographer Comments: No subcostal window. IMPRESSIONS  1. Left ventricular ejection fraction, by estimation, is 55 to  60%. The left ventricle has normal function. The left ventricle has no regional wall motion abnormalities. There is mild left ventricular hypertrophy. Left ventricular diastolic parameters are consistent with Grade I diastolic dysfunction (impaired relaxation).  2. Right ventricular systolic function is normal. The right ventricular size is normal. Mildly increased right ventricular wall thickness.  3. Left atrial size was mildly dilated.  4. The mitral valve is degenerative. Mild to moderate mitral valve regurgitation. No evidence of mitral stenosis. Moderate mitral annular calcification.  5. The aortic valve has an indeterminant number of cusps. There is severe calcifcation of the aortic valve. There is severe thickening of the aortic valve. Aortic valve regurgitation is trivial. Moderate to severe aortic valve stenosis. Aortic valve area, by VTI measures 0.74 cm. Aortic valve mean gradient measures 38.0 mmHg. Comparison(s): A prior study was performed on 11/08/2019. Aortic stenosis has worsened and is now moderate to severe (mean gradient 31->38 mmHg, AVA 1.4->0.7 cm^2). FINDINGS  Left Ventricle: Left ventricular ejection fraction, by estimation, is 55 to 60%. The left ventricle has normal function. The left ventricle has no regional wall motion abnormalities. The left ventricular internal cavity size was normal in size. There is  mild left ventricular hypertrophy.  Left ventricular diastolic parameters are consistent with Grade I diastolic dysfunction (impaired relaxation). Right Ventricle: The right ventricular size is normal. Mildly increased right ventricular wall thickness. Right ventricular systolic function is normal. Left Atrium: Left atrial size was mildly dilated. Right Atrium: Right atrial size was normal in size. Pericardium: There is no evidence of pericardial effusion. Presence of pericardial fat pad. Mitral Valve: The mitral valve is degenerative in appearance. There is mild thickening of the mitral  valve leaflet(s). Moderate mitral annular calcification. Mild to moderate mitral valve regurgitation. No evidence of mitral valve stenosis. MV peak gradient, 4.7 mmHg. The mean mitral valve gradient is 1.0 mmHg. Tricuspid Valve: The tricuspid valve is normal in structure. Tricuspid valve regurgitation is not demonstrated. Aortic Valve: The aortic valve has an indeterminant number of cusps. There is severe calcifcation of the aortic valve. There is severe thickening of the aortic valve. Aortic valve regurgitation is trivial. Moderate to severe aortic stenosis is present. Aortic valve mean gradient measures 38.0 mmHg. Aortic valve peak gradient measures 59.6 mmHg. Aortic valve area, by VTI measures 0.74 cm. Pulmonic Valve: The pulmonic valve was not well visualized. Pulmonic valve regurgitation is not visualized. No evidence of pulmonic stenosis. Aorta: The aortic root is normal in size and structure. Pulmonary Artery: The pulmonary artery is of normal size. Venous: The inferior vena cava was not well visualized. IAS/Shunts: The interatrial septum was not well visualized. Additional Comments: A device lead is visualized in the right ventricle.  LEFT VENTRICLE PLAX 2D LVIDd:         4.50 cm  Diastology LVIDs:         2.50 cm  LV e' medial:    5.11 cm/s LV PW:         1.40 cm  LV E/e' medial:  12.7 LV IVS:        1.10 cm  LV e' lateral:   4.68 cm/s LVOT diam:     2.10 cm  LV E/e' lateral: 13.8 LV SV:         60 LV SV Index:   29 LVOT Area:     3.46 cm  RIGHT VENTRICLE RV Basal diam:  1.80 cm LEFT ATRIUM             Index       RIGHT ATRIUM           Index LA diam:        3.80 cm 1.84 cm/m  RA Area:     10.45 cm 5.07 cm/m LA Vol (A2C):   54.9 ml 26.63 ml/m LA Vol (A4C):   42.4 ml 20.57 ml/m LA Biplane Vol: 51.0 ml 24.74 ml/m  AORTIC VALVE                    PULMONIC VALVE AV Area (Vmax):    0.70 cm     PV Vmax:       1.38 m/s AV Area (Vmean):   0.68 cm     PV Vmean:      83.200 cm/s AV Area (VTI):     0.74 cm      PV VTI:        0.241 m AV Vmax:           386.00 cm/s  PV Peak grad:  7.6 mmHg AV Vmean:          265.000 cm/s PV Mean grad:  3.0 mmHg AV VTI:  0.805 m AV Peak Grad:      59.6 mmHg AV Mean Grad:      38.0 mmHg LVOT Vmax:         77.70 cm/s LVOT Vmean:        51.900 cm/s LVOT VTI:          0.173 m LVOT/AV VTI ratio: 0.21  AORTA Ao Root diam: 3.20 cm MITRAL VALVE MV Area (PHT): 2.39 cm    SHUNTS MV Area VTI:   2.04 cm    Systemic VTI:  0.17 m MV Peak grad:  4.7 mmHg    Systemic Diam: 2.10 cm MV Mean grad:  1.0 mmHg MV Vmax:       1.08 m/s MV Vmean:      56.8 cm/s MV Decel Time: 318 msec MV E velocity: 64.70 cm/s MV A velocity: 95.10 cm/s MV E/A ratio:  0.68 Cristal Deer End MD Electronically signed by Yvonne Kendall MD Signature Date/Time: 09/12/2020/1:47:39 PM    Final     Subjective: Patient was seen and examined today.  Resting comfortably.  Daughter-in-law at bedside.  No new complaints.  Discharge Exam: Vitals:   09/15/20 0348 09/15/20 0840  BP: (!) 157/75 (!) 146/77  Pulse: (!) 59 60  Resp: 19 18  Temp: 98.4 F (36.9 C) 98 F (36.7 C)  SpO2: 98% 96%   Vitals:   09/14/20 2055 09/15/20 0050 09/15/20 0348 09/15/20 0840  BP: 136/74 (!) 139/95 (!) 157/75 (!) 146/77  Pulse: 61 60 (!) 59 60  Resp: 19 20 19 18   Temp: 98.3 F (36.8 C) 98.4 F (36.9 C) 98.4 F (36.9 C) 98 F (36.7 C)  TempSrc:      SpO2: 96% 97% 98% 96%  Weight:      Height:        General: Pt is alert, awake, not in acute distress Cardiovascular: RRR, S1/S2 +, no rubs, no gallops Respiratory: CTA bilaterally, no wheezing, no rhonchi Abdominal: Soft, NT, ND, bowel sounds + Extremities: no edema, no cyanosis   The results of significant diagnostics from this hospitalization (including imaging, microbiology, ancillary and laboratory) are listed below for reference.    Microbiology: Recent Results (from the past 240 hour(s))  Resp Panel by RT-PCR (Flu A&B, Covid) Nasopharyngeal Swab     Status: None    Collection Time: 09/09/20  5:53 PM   Specimen: Nasopharyngeal Swab; Nasopharyngeal(NP) swabs in vial transport medium  Result Value Ref Range Status   SARS Coronavirus 2 by RT PCR NEGATIVE NEGATIVE Final    Comment: (NOTE) SARS-CoV-2 target nucleic acids are NOT DETECTED.  The SARS-CoV-2 RNA is generally detectable in upper respiratory specimens during the acute phase of infection. The lowest concentration of SARS-CoV-2 viral copies this assay can detect is 138 copies/mL. A negative result does not preclude SARS-Cov-2 infection and should not be used as the sole basis for treatment or other patient management decisions. A negative result may occur with  improper specimen collection/handling, submission of specimen other than nasopharyngeal swab, presence of viral mutation(s) within the areas targeted by this assay, and inadequate number of viral copies(<138 copies/mL). A negative result must be combined with clinical observations, patient history, and epidemiological information. The expected result is Negative.  Fact Sheet for Patients:  09/11/20  Fact Sheet for Healthcare Providers:  BloggerCourse.com  This test is no t yet approved or cleared by the [place] FDA and  has been authorized for detection and/or diagnosis of SARS-CoV-2 by FDA under an Emergency Use  Authorization (EUA). This EUA will remain  in effect (meaning this test can be used) for the duration of the COVID-19 declaration under Section 564(b)(1) of the Act, 21 U.S.C.section 360bbb-3(b)(1), unless the authorization is terminated  or revoked sooner.       Influenza A by PCR NEGATIVE NEGATIVE Final   Influenza B by PCR NEGATIVE NEGATIVE Final    Comment: (NOTE) The Xpert Xpress SARS-CoV-2/FLU/RSV plus assay is intended as an aid in the diagnosis of influenza from Nasopharyngeal swab specimens and should not be used as a sole basis for treatment.  Nasal washings and aspirates are unacceptable for Xpert Xpress SARS-CoV-2/FLU/RSV testing.  Fact Sheet for Patients: BloggerCourse.com  Fact Sheet for Healthcare Providers: SeriousBroker.it  This test is not yet approved or cleared by the [place] FDA and has been authorized for detection and/or diagnosis of SARS-CoV-2 by FDA under an Emergency Use Authorization (EUA). This EUA will remain in effect (meaning this test can be used) for the duration of the COVID-19 declaration under Section 564(b)(1) of the Act, 21 U.S.C. section 360bbb-3(b)(1), unless the authorization is terminated or revoked.  Performed at [facility], [address] [place], [place] [address]   SARS CORONAVIRUS 2 (TAT 6-24 HRS) Nasopharyngeal Nasopharyngeal Swab     Status: None   Collection Time: 09/14/20  1:19 PM   Specimen: Nasopharyngeal Swab  Result Value Ref Range Status   SARS Coronavirus 2 NEGATIVE NEGATIVE Final    Comment: (NOTE) SARS-CoV-2 target nucleic acids are NOT DETECTED.  The SARS-CoV-2 RNA is generally detectable in upper and lower respiratory specimens during the acute phase of infection. Negative results do not preclude SARS-CoV-2 infection, do not rule out co-infections with other pathogens, and should not be used as the sole basis for treatment or other patient management decisions. Negative results must be combined with clinical observations, patient history, and epidemiological information. The expected result is Negative.  Fact Sheet for Patients: HairSlick.no  Fact Sheet for Healthcare Providers: quierodirigir.com  This test is not yet approved or cleared by the [place] FDA and  has been authorized for detection and/or diagnosis of SARS-CoV-2 by FDA under an Emergency Use Authorization (EUA). This EUA will remain  in effect (meaning this test can be  used) for the duration of the COVID-19 declaration under Se ction 564(b)(1) of the Act, 21 U.S.C. section 360bbb-3(b)(1), unless the authorization is terminated or revoked sooner.  Performed at [facility], [address] [place], [place] [address]      Labs: BNP (last 3 results) Recent Labs    09/09/20 2130  BNP 209.4*   Basic Metabolic Panel: Recent Labs  Lab 09/09/20 1548 09/09/20 2130 09/10/20 0651 09/12/20 0522 09/13/20 0819 09/15/20 0611  NA 137  --  138 138 137 136  K 4.1  --  3.7 3.4* 3.9 3.6  CL 106  --  108 104 104 105  CO2 20*  --  GLUCOSE 184*  --  131* 120* 124* 116*  BUN 24*  --  21 24* 21 20  CREATININE 1.30*  --  1.04 1.07 1.09 1.05  CALCIUM 9.3  --  8.6* 8.4* 8.6* 8.7*  MG  --  1.9  --   --   --   --    Liver Function Tests: Recent Labs  Lab 09/09/20 1548 09/10/20 0651 09/13/20 0819 09/15/20 0611  AST 32  ALT 34  ALKPHOS 89 77  86 92  BILITOT 2.1* 2.1* 2.4* 2.6*  PROT 7.2 5.9* 6.2* 6.3*  ALBUMIN 3.8 3.3* 3.2* 3.2*   Recent Labs  Lab 09/09/20 2130  LIPASE 19   No results for input(s): AMMONIA in the last 168 hours. CBC: Recent Labs  Lab 09/09/20 1548 09/10/20 0651 09/11/20 2132 09/15/20 0904  WBC 11.6* 9.5  --  7.8  HGB 17.1* 15.2  --  16.0  HCT 47.8 43.2 42.1 44.2  MCV 87.9 87.6  --  86.8  PLT 142* 128*  --  130*   Cardiac Enzymes: Recent Labs  Lab 09/09/20 2130  CKTOTAL 55   BNP: Invalid input(s): POCBNP CBG: Recent Labs  Lab 09/13/20 2134  GLUCAP 164*   D-Dimer No results for input(s): DDIMER in the last 72 hours. Hgb A1c No results for input(s): HGBA1C in the last 72 hours. Lipid Profile No results for input(s): CHOL, HDL, LDLCALC, TRIG, CHOLHDL, LDLDIRECT in the last 72 hours. Thyroid function studies No results for input(s): TSH, T4TOTAL, T3FREE, THYROIDAB in the last 72 hours.  Invalid input(s): FREET3 Anemia work up No results for input(s): VITAMINB12, FOLATE,  FERRITIN, TIBC, IRON, RETICCTPCT in the last 72 hours. Urinalysis    Component Value Date/Time   COLORURINE YELLOW (A) 09/09/2020 1548   APPEARANCEUR CLEAR (A) 09/09/2020 1548   APPEARANCEUR Hazy (A) 09/08/2019 0948   LABSPEC 1.020 09/09/2020 1548   PHURINE 7.0 09/09/2020 1548   GLUCOSEU NEGATIVE 09/09/2020 1548   HGBUR SMALL (A) 09/09/2020 1548   BILIRUBINUR NEGATIVE 09/09/2020 1548   BILIRUBINUR Negative 09/08/2019 0948   KETONESUR 20 (A) 09/09/2020 1548   PROTEINUR 30 (A) 09/09/2020 1548   NITRITE NEGATIVE 09/09/2020 1548   LEUKOCYTESUR NEGATIVE 09/09/2020 1548   Sepsis Labs Invalid input(s): PROCALCITONIN,  WBC,  LACTICIDVEN Microbiology Recent Results (from the past 240 hour(s))  Resp Panel by RT-PCR (Flu A&B, Covid) Nasopharyngeal Swab     Status: None   Collection Time: 09/09/20  5:53 PM   Specimen: Nasopharyngeal Swab; Nasopharyngeal(NP) swabs in vial transport medium  Result Value Ref Range Status   SARS Coronavirus 2 by RT PCR NEGATIVE NEGATIVE Final    Comment: (NOTE) SARS-CoV-2 target nucleic acids are NOT DETECTED.  The SARS-CoV-2 RNA is generally detectable in upper respiratory specimens during the acute phase of infection. The lowest concentration of SARS-CoV-2 viral copies this assay can detect is 138 copies/mL. A negative result does not preclude SARS-Cov-2 infection and should not be used as the sole basis for treatment or other patient management decisions. A negative result may occur with  improper specimen collection/handling, submission of specimen other than nasopharyngeal swab, presence of viral mutation(s) within the areas targeted by this assay, and inadequate number of viral copies(<138 copies/mL). A negative result must be combined with clinical observations, patient history, and epidemiological information. The expected result is Negative.  Fact Sheet for Patients:  BloggerCourse.comhttps://www.fda.gov/media/152166/download  Fact Sheet for Healthcare  Providers:  SeriousBroker.ithttps://www.fda.gov/media/152162/download  This test is no t yet approved or cleared by the [place] FDA and  has been authorized for detection and/or diagnosis of SARS-CoV-2 by FDA under an Emergency Use Authorization (EUA). This EUA will remain  in effect (meaning this test can be used) for the duration of the COVID-19 declaration under Section 564(b)(1) of the Act, 21 U.S.C.section 360bbb-3(b)(1), unless the authorization is terminated  or revoked sooner.       Influenza A by PCR NEGATIVE NEGATIVE Final   Influenza B by PCR NEGATIVE NEGATIVE Final  Comment: (NOTE) The Xpert Xpress SARS-CoV-2/FLU/RSV plus assay is intended as an aid in the diagnosis of influenza from Nasopharyngeal swab specimens and should not be used as a sole basis for treatment. Nasal washings and aspirates are unacceptable for Xpert Xpress SARS-CoV-2/FLU/RSV testing.  Fact Sheet for Patients: BloggerCourse.com  Fact Sheet for Healthcare Providers: SeriousBroker.it  This test is not yet approved or cleared by the [place] FDA and has been authorized for detection and/or diagnosis of SARS-CoV-2 by FDA under an Emergency Use Authorization (EUA). This EUA will remain in effect (meaning this test can be used) for the duration of the COVID-19 declaration under Section 564(b)(1) of the Act, 21 U.S.C. section 360bbb-3(b)(1), unless the authorization is terminated or revoked.  Performed at [facility], [address] [place], [place] [address]   SARS CORONAVIRUS 2 (TAT 6-24 HRS) Nasopharyngeal Nasopharyngeal Swab     Status: None   Collection Time: 09/14/20  1:19 PM   Specimen: Nasopharyngeal Swab  Result Value Ref Range Status   SARS Coronavirus 2 NEGATIVE NEGATIVE Final    Comment: (NOTE) SARS-CoV-2 target nucleic acids are NOT DETECTED.  The SARS-CoV-2 RNA is generally detectable in upper and lower respiratory  specimens during the acute phase of infection. Negative results do not preclude SARS-CoV-2 infection, do not rule out co-infections with other pathogens, and should not be used as the sole basis for treatment or other patient management decisions. Negative results must be combined with clinical observations, patient history, and epidemiological information. The expected result is Negative.  Fact Sheet for Patients: HairSlick.no  Fact Sheet for Healthcare Providers: quierodirigir.com  This test is not yet approved or cleared by the [place] FDA and  has been authorized for detection and/or diagnosis of SARS-CoV-2 by FDA under an Emergency Use Authorization (EUA). This EUA will remain  in effect (meaning this test can be used) for the duration of the COVID-19 declaration under Se ction 564(b)(1) of the Act, 21 U.S.C. section 360bbb-3(b)(1), unless the authorization is terminated or revoked sooner.  Performed at [facility], [address] [place], [place] [address]     Time coordinating discharge: Over 30 minutes  SIGNED:  Arnetha Courser, MD  [facility] 09/15/2020, 2:09 PM  If 7PM-7AM, please contact night-coverage [email or web address]  This record has been created using Conservation officer, historic buildings. Errors have been sought and corrected,but may not always be located. Such creation errors do not reflect on the standard of care.

## 2020-09-15 NOTE — Progress Notes (Signed)
EMS arrived. Pt transported from [facility] to [facility].

## 2020-09-15 NOTE — Progress Notes (Addendum)
Discharge Note: Contacted and Reported to [employer], Marylene Land, Charity fundraiser. Facility nurse verbalized understanding.

## 2020-09-15 NOTE — Progress Notes (Signed)
Physical Therapy Treatment Patient Details Name: Timothy Ramirez MRN: [id number] DOB: 1934/02/09 Today's Date: 09/15/2020    History of Present Illness 87 y/o M admitted on 09/09/20 with c/c of generalized weakness, AMS, & N&V. Initially suspected to be gastroenteritis.  On 7/25, repeat CT scan revealed subacute left cerebellar infarct with some petechial hemorrhage. PMH: aortic stenosis, borderline DM, complete heart block, heart disease, HLD, HTN, alzheimer's dementia    PT Comments    Pt requires MAX encouragement for participation, initially resisting PT facilitating OOB movement. Pt then reports need to void & is able to stand EOB & void in urinal with min assist for standing balance. Pt ambulates to door & back with RW & min assist with LLE ataxia & impaired balance with gait, decreased awareness of safe mobility in small spaces in room. Pt declines sitting in recliner despite max encouragement & instead returns to bed. Will continue to follow pt to address balance & progress gait with LRAD.    Follow Up Recommendations  SNF;Supervision/Assistance - 24 hour     Equipment Recommendations  Rolling walker with 5" wheels (TBD in next venue)    Recommendations for Other Services       Precautions / Restrictions Precautions Precautions: Fall Restrictions Weight Bearing Restrictions: No    Mobility  Bed Mobility Overal bed mobility: Needs Assistance Bed Mobility: Sidelying to Sit   Sidelying to sit: Min assist (bed rails)   Sit to supine: Supervision        Transfers Overall transfer level: Needs assistance Equipment used: Rolling walker (2 wheeled) Transfers: Sit to/from Stand Sit to Stand: Min guard;Min assist            Ambulation/Gait Ambulation/Gait assistance: Min Chemical engineer (Feet): 20 Feet Assistive device: Rolling walker (2 wheeled) Gait Pattern/deviations: Decreased step length - right;Decreased step length - left;Decreased stride length Gait  velocity: decreased   General Gait Details: LLE ataxia, decreased awareness of RW management in small spaces   Stairs             Wheelchair Mobility    Modified Rankin (Stroke Patients Only)       Balance Overall balance assessment: Needs assistance Sitting-balance support: Feet supported;No upper extremity supported Sitting balance-Leahy Scale: Fair     Standing balance support: During functional activity;No upper extremity supported;Single extremity supported Standing balance-Leahy Scale: Fair Standing balance comment: Pt able to stand & void, holding urinal with min assist for standing balance                            Cognition Arousal/Alertness: Lethargic Behavior During Therapy: WFL for tasks assessed/performed Overall Cognitive Status: History of cognitive impairments - at baseline                                 General Comments: Requires MAX encouragement & multimodal cuing for engagement in session.      Exercises      General Comments        Pertinent Vitals/Pain Pain Assessment: Faces Faces Pain Scale: No hurt    Home Living                      Prior Function            PT Goals (current goals can now be found in the care plan section) Acute Rehab PT Goals  Patient Stated Goal: go to rehab Time For Goal Achievement: 09/24/20 Potential to Achieve Goals: Good Progress towards PT goals: Progressing toward goals    Frequency    7X/week      PT Plan Current plan remains appropriate    Co-evaluation              AM-PAC PT "6 Clicks" Mobility   Outcome Measure  Help needed turning from your back to your side while in a flat bed without using bedrails?: None Help needed moving from lying on your back to sitting on the side of a flat bed without using bedrails?: A Little Help needed moving to and from a bed to a chair (including a wheelchair)?: A Little Help needed standing up from a chair  using your arms (e.g., wheelchair or bedside chair)?: A Little Help needed to walk in hospital room?: A Little Help needed climbing 3-5 steps with a railing? : A Lot 6 Click Score: 18    End of Session Equipment Utilized During Treatment: Gait belt Activity Tolerance: Patient limited by fatigue (pt self limiting) Patient left: in bed;with call bell/phone within reach;with bed alarm set;with family/visitor present (4 rails up per family request)   PT Visit Diagnosis: Unsteadiness on feet (R26.81);Muscle weakness (generalized) (M62.81);Difficulty in walking, not elsewhere classified (R26.2)     Time: 1610-9604 PT Time Calculation (min) (ACUTE ONLY): 11 min  Charges:  $Therapeutic Activity: 8-22 mins                    Aleda Grana, PT, DPT 09/15/20, 10:11 AM    Sandi Mariscal 09/15/2020, 10:09 AM

## 2020-10-12 ENCOUNTER — Ambulatory Visit (INDEPENDENT_AMBULATORY_CARE_PROVIDER_SITE_OTHER): Payer: Medicare Other

## 2020-10-12 DIAGNOSIS — I442 Atrioventricular block, complete: Secondary | ICD-10-CM | POA: Diagnosis not present

## 2020-10-12 LAB — CUP PACEART REMOTE DEVICE CHECK
Battery Remaining Longevity: 128 mo
Battery Voltage: 3.16 V
Brady Statistic AP VP Percent: 78.66 %
Brady Statistic AP VS Percent: 0 %
Brady Statistic AS VP Percent: 21.16 %
Brady Statistic AS VS Percent: 0.18 %
Brady Statistic RA Percent Paced: 78.36 %
Brady Statistic RV Percent Paced: 99.82 %
Date Time Interrogation Session: 20220824195429
Implantable Lead Implant Date: 20220525
Implantable Lead Implant Date: 20220525
Implantable Lead Location: 753859
Implantable Lead Location: 753860
Implantable Lead Model: 5076
Implantable Lead Model: 5076
Implantable Pulse Generator Implant Date: 20220525
Lead Channel Impedance Value: 342 Ohm
Lead Channel Impedance Value: 437 Ohm
Lead Channel Impedance Value: 494 Ohm
Lead Channel Impedance Value: 684 Ohm
Lead Channel Pacing Threshold Amplitude: 0.625 V
Lead Channel Pacing Threshold Amplitude: 0.875 V
Lead Channel Pacing Threshold Pulse Width: 0.4 ms
Lead Channel Pacing Threshold Pulse Width: 0.4 ms
Lead Channel Sensing Intrinsic Amplitude: 14 mV
Lead Channel Sensing Intrinsic Amplitude: 14 mV
Lead Channel Sensing Intrinsic Amplitude: 2.5 mV
Lead Channel Sensing Intrinsic Amplitude: 2.5 mV
Lead Channel Setting Pacing Amplitude: 2.5 V
Lead Channel Setting Pacing Amplitude: 3 V
Lead Channel Setting Pacing Pulse Width: 0.4 ms
Lead Channel Setting Sensing Sensitivity: 1.2 mV

## 2020-10-18 ENCOUNTER — Encounter: Payer: Medicare Other | Admitting: Cardiology

## 2020-10-26 NOTE — Progress Notes (Signed)
Remote pacemaker transmission.   

## 2020-10-27 ENCOUNTER — Ambulatory Visit: Payer: Medicare Other | Admitting: Internal Medicine

## 2020-10-27 ENCOUNTER — Other Ambulatory Visit: Payer: Medicare Other

## 2021-02-09 ENCOUNTER — Emergency Department
Admission: EM | Admit: 2021-02-09 | Discharge: 2021-02-09 | Disposition: A | Discharge status: Skilled Nursing Facility | Payer: Medicare Other | Attending: Emergency Medicine | Admitting: Emergency Medicine

## 2021-02-09 ENCOUNTER — Emergency Department: Payer: Medicare Other

## 2021-02-09 ENCOUNTER — Encounter: Payer: Self-pay | Admitting: Emergency Medicine

## 2021-02-09 ENCOUNTER — Other Ambulatory Visit: Payer: Self-pay

## 2021-02-09 DIAGNOSIS — I350 Nonrheumatic aortic (valve) stenosis: Secondary | ICD-10-CM

## 2021-02-09 DIAGNOSIS — Z7982 Long term (current) use of aspirin: Secondary | ICD-10-CM | POA: Diagnosis not present

## 2021-02-09 DIAGNOSIS — N179 Acute kidney failure, unspecified: Secondary | ICD-10-CM | POA: Insufficient documentation

## 2021-02-09 DIAGNOSIS — I2510 Atherosclerotic heart disease of native coronary artery without angina pectoris: Secondary | ICD-10-CM | POA: Diagnosis not present

## 2021-02-09 DIAGNOSIS — R531 Weakness: Secondary | ICD-10-CM

## 2021-02-09 DIAGNOSIS — R0602 Shortness of breath: Secondary | ICD-10-CM | POA: Insufficient documentation

## 2021-02-09 DIAGNOSIS — I10 Essential (primary) hypertension: Secondary | ICD-10-CM | POA: Diagnosis not present

## 2021-02-09 DIAGNOSIS — F0390 Unspecified dementia without behavioral disturbance: Secondary | ICD-10-CM | POA: Diagnosis not present

## 2021-02-09 LAB — URINALYSIS, ROUTINE W REFLEX MICROSCOPIC
Glucose, UA: NEGATIVE mg/dL
Hgb urine dipstick: NEGATIVE
Ketones, ur: 15 mg/dL — AB
Leukocytes,Ua: NEGATIVE
Nitrite: NEGATIVE
Specific Gravity, Urine: 1.025 (ref 1.005–1.030)
pH: 6 (ref 5.0–8.0)

## 2021-02-09 LAB — BRAIN NATRIURETIC PEPTIDE: B Natriuretic Peptide: 82.5 pg/mL (ref 0.0–100.0)

## 2021-02-09 LAB — URINALYSIS, MICROSCOPIC (REFLEX): Bacteria, UA: NONE SEEN

## 2021-02-09 LAB — CBC
HCT: 44.4 % (ref 39.0–52.0)
Hemoglobin: 15.3 g/dL (ref 13.0–17.0)
MCH: 30.9 pg (ref 26.0–34.0)
MCHC: 34.5 g/dL (ref 30.0–36.0)
MCV: 89.7 fL (ref 80.0–100.0)
Platelets: 143 10*3/uL — ABNORMAL LOW (ref 150–400)
RBC: 4.95 MIL/uL (ref 4.22–5.81)
RDW: 13.1 % (ref 11.5–15.5)
WBC: 16.4 10*3/uL — ABNORMAL HIGH (ref 4.0–10.5)
nRBC: 0 % (ref 0.0–0.2)

## 2021-02-09 LAB — BASIC METABOLIC PANEL
Anion gap: 7 (ref 5–15)
BUN: 24 mg/dL — ABNORMAL HIGH (ref 8–23)
CO2: 20 mmol/L — ABNORMAL LOW (ref 22–32)
Calcium: 8.7 mg/dL — ABNORMAL LOW (ref 8.9–10.3)
Chloride: 110 mmol/L (ref 98–111)
Creatinine, Ser: 1.64 mg/dL — ABNORMAL HIGH (ref 0.61–1.24)
GFR, Estimated: 41 mL/min — ABNORMAL LOW (ref >60)
Glucose, Bld: 174 mg/dL — ABNORMAL HIGH (ref 70–99)
Potassium: 4.2 mmol/L (ref 3.5–5.1)
Sodium: 137 mmol/L (ref 135–145)

## 2021-02-09 LAB — LACTIC ACID, PLASMA
Lactic Acid, Venous: 3 mmol/L (ref 0.5–1.9)
Lactic Acid, Venous: 2.3 mmol/L (ref 0.5–1.9)

## 2021-02-09 LAB — TROPONIN I (HIGH SENSITIVITY)
Troponin I (High Sensitivity): 112 ng/L (ref <18)
Troponin I (High Sensitivity): 84 ng/L — ABNORMAL HIGH (ref <18)

## 2021-02-09 MED ORDER — SODIUM CHLORIDE 0.9 % IV SOLN
500.0000 mg | Freq: Once | INTRAVENOUS | Status: DC
  Filled 2021-02-09: qty 5

## 2021-02-09 MED ORDER — LACTATED RINGERS IV BOLUS: 1000.0000 mL | Freq: Once | INTRAVENOUS | Status: DC

## 2021-02-09 MED ORDER — SODIUM CHLORIDE 0.9 % IV BOLUS
1000.0000 mL | Freq: Once | INTRAVENOUS | Status: AC
  Administered 2021-02-09: 10:00:00 1000 mL via INTRAVENOUS

## 2021-02-09 MED ORDER — SODIUM CHLORIDE 0.9 % IV SOLN
1.0000 g | Freq: Once | INTRAVENOUS | Status: DC
  Filled 2021-02-09: qty 10

## 2021-02-09 NOTE — ED Triage Notes (Signed)
Pt comes into the ED via [facility] from [facility] care c/o weakness and hypotension.  Initial call out was of patient not being at his baseline, hypotension, diaphoretic, etc.  Pt was ambulatory at the facility where he was able to ambulate to the bathroom.    86/67, last read at 92/40 20g R hand saline given CBG 160 DNR

## 2021-02-09 NOTE — ED Provider Notes (Signed)
[facility] Emergency Department Provider Note ____________________________________________   Event Date/Time   First MD Initiated Contact with Patient 02/09/21 [phone]     (approximate)  I have reviewed the triage vital signs and the nursing notes.  HISTORY  Chief Complaint Weakness and Shortness of Breath   HPI Timothy Ramirez is a 87 y.o. malewho presents to the ED for evaluation of generalized weakness.   Chart review indicates history of complete heart block and aortic stenosis.  HTN and HLD. Moderate to severe AS. Cerebellar infarct this past summer. Alzheimer's disease and dementia.  Patient resides at a local memory care facility and is only occasionally ambulatory with a walker.  He cannot provide any relevant history due to his dementia and history is limited due to this.  Majority of history is provided by daughter at the bedside.  Daughter reports that she was called by the facility this morning due to complaints of weakness and feeling short of breath of the patient.  He was apparently diaphoretic and hypotensive at the facility, soft pressures with EMS improving with peripheral IV fluids. Here in the ED, patient reports no complaints and says he feels fine.  Daughter reports that he seems more weak and might be breathing a little bit faster.  Daughter reports that family and her do not want the patient to have any aggressive procedures.  They declined previous offers for aortic valve replacement.  He is a DNR/DNI and she reports that they really want to just focus on his comfort.  She is hesitant when I am clarifying this and discussed antibiotics if there was any infection present, saying she/family would only want antibiotics if he was uncomfortable and they would help, reemphasizing they want to focus on his comfort and no aggressive interventions.  She is okay with IV fluids and any other symptomatic medications.  Past Medical History:   Diagnosis Date   Aortic stenosis    Moderate (mean gradient 37, AVA 1.1 by cath in 01/2020)   Borderline diabetes    Complete heart block (HCC) 06/2020   s/p [employer] PPM 07/12/2020   Coronary artery disease 01/2020   Mild, nonobstructive dz by cath   Hyperlipidemia    Hypertension     Patient Active Problem List   Diagnosis Date Noted   Stenosis of left vertebral artery    Acute stroke due to ischemia [facility])    Depression    Memory loss    Gastroenteritis 09/10/2020   Weakness 09/09/2020   Nausea & vomiting 09/09/2020   Complete heart block (HCC) 07/12/2020   DNR (do not resuscitate) 07/12/2020   Heart block AV third degree (HCC) 07/11/2020   Dementia without behavioral disturbance (HCC)    Heart block 05/05/2020   CAD in native artery 05/05/2020   AV block, 2nd degree 12/24/2019   Bradycardia 11/08/2019   Constipation 11/08/2019   Elevated troponin 11/08/2019   Diabetes mellitus type 2, uncomplicated (HCC) 11/08/2019   Microhematuria 08/04/2019   BPH without obstruction/lower urinary tract symptoms 08/04/2019   Atypical chest pain 05/20/2018   Shortness of breath 05/20/2018   Severe aortic stenosis 05/20/2018   Nonrheumatic mitral valve regurgitation 05/20/2018   Essential hypertension 05/20/2018   Mixed hyperlipidemia 05/20/2018    Past Surgical History:  Procedure Laterality Date   APPENDECTOMY     HERNIA REPAIR     PACEMAKER IMPLANT N/A 07/12/2020   Procedure: PACEMAKER IMPLANT;  Surgeon: Lanier Prude, MD;  Location: [facility] INVASIVE CV LAB;  Service: Cardiovascular;  Laterality: N/A;   RIGHT/LEFT HEART CATH AND CORONARY ANGIOGRAPHY N/A 02/03/2020   Procedure: RIGHT/LEFT HEART CATH AND CORONARY ANGIOGRAPHY;  Surgeon: Yvonne Kendall, MD;  Location: [facility] INVASIVE CV LAB;  Service: Cardiovascular;  Laterality: N/A;   TONSILLECTOMY      Prior to Admission medications   Medication Sig Start Date End Date Taking? Authorizing Provider  aspirin EC 81 MG tablet  Take 81 mg by mouth daily. Swallow whole.   Yes [provider]  atorvastatin (LIPITOR) 20 MG tablet Take 20 mg by mouth daily.   Yes [provider]  Cholecalciferol (D3-1000 PO) Take 1 tablet by mouth daily.   Yes [provider]  citalopram (CELEXA) 20 MG tablet Take 20 mg by mouth daily. 08/18/19  Yes [provider]  esomeprazole (NEXIUM) 20 MG capsule Take 1 capsule (20 mg total) by mouth daily. 09/15/20 09/15/21 Yes Arnetha Courser, MD  famotidine (PEPCID) 20 MG tablet Take 20 mg by mouth daily. 01/22/21  Yes [provider]  memantine (NAMENDA) 5 MG tablet Take 5 mg by mouth 2 (two) times daily. 06/15/20  Yes [provider]  temazepam (RESTORIL) 15 MG capsule Take 1 capsule (15 mg total) by mouth at bedtime as needed for sleep. 09/15/20  Yes Arnetha Courser, MD  vitamin B-12 (CYANOCOBALAMIN) 1000 MCG tablet Take 1,000 mcg by mouth daily.   Yes [provider]  amLODipine (NORVASC) 5 MG tablet Take 1 tablet (5 mg total) by mouth daily. Patient not taking: Reported on 02/09/2021 09/15/20   Arnetha Courser, MD  feeding supplement (ENSURE ENLIVE / ENSURE PLUS) LIQD Take 237 mLs by mouth daily. 09/15/20   Arnetha Courser, MD  metoprolol succinate (TOPROL-XL) 25 MG 24 hr tablet Take 1 tablet (25 mg total) by mouth daily. Patient not taking: Reported on 02/09/2021 09/15/20   Arnetha Courser, MD  ondansetron (ZOFRAN) 4 MG tablet Take 1 tablet by mouth every 12 (twelve) hours as needed. 01/19/21   [provider]  polyethylene glycol (MIRALAX / GLYCOLAX) 17 g packet Take 17 g by mouth daily as needed for mild constipation or moderate constipation. 09/15/20   Arnetha Courser, MD    Allergies Patient has no known allergies.  Family History  Problem Relation Age of Onset   Congestive Heart Failure Mother    Heart attack Mother 61   Cirrhosis Father    COPD Sister     Social History Social History   Tobacco Use   Smoking status: Never    Smokeless tobacco: Never  Vaping Use   Vaping Use: Never used  Substance Use Topics   Alcohol use: Not Currently    Alcohol/week: 2.0 standard drinks    Types: 1 Cans of beer, 1 Shots of liquor per week    Comment: last drink was Saturday, 04/22/2020.   Drug use: No    Review of Systems  Unable to be accurately assessed due to patient's disorientation. ____________________________________________   PHYSICAL EXAM:  VITAL SIGNS: Vitals:   02/09/21 1330 02/09/21 1400  BP: 119/62 135/69  Pulse: 75 80  Resp: (!) 24 (!) 31  Temp:    SpO2: 94% 93%     Constitutional: Alert and pleasantly disoriented. Well appearing and in no acute distress. Eyes: Conjunctivae are normal. PERRL. EOMI. Head: Atraumatic. Nose: No congestion/rhinnorhea. Mouth/Throat: Mucous membranes are moist.  Oropharynx non-erythematous. Neck: No stridor. No cervical spine tenderness to palpation. Cardiovascular: Normal rate, regular rhythm.  Blowing systolic murmur consistent with AAS.  Good  peripheral circulation. Respiratory: Tachypneic with clear lungs Gastrointestinal: Soft , nondistended, nontender to palpation. No CVA tenderness. Musculoskeletal: No lower extremity tenderness nor edema.  No joint effusions. No signs of acute trauma. Neurologic:  Normal speech and language. No gross focal neurologic deficits are appreciated.  Skin:  Skin is warm, dry and intact. No rash noted. Psychiatric: Mood and affect are normal. Speech and behavior are normal.  ____________________________________________   LABS (all labs ordered are listed, but only abnormal results are displayed)  Labs Reviewed  BASIC METABOLIC PANEL - Abnormal; Notable for the following components:      Result Value   CO2 20 (*)    Glucose, Bld 174 (*)    BUN 24 (*)    Creatinine, Ser 1.64 (*)    Calcium 8.7 (*)    GFR, Estimated 41 (*)    All other components within normal limits  CBC - Abnormal; Notable for the following components:    WBC 16.4 (*)    Platelets 143 (*)    All other components within normal limits  URINALYSIS, ROUTINE W REFLEX MICROSCOPIC - Abnormal; Notable for the following components:   Bilirubin Urine SMALL (*)    Ketones, ur 15 (*)    Protein, ur TRACE (*)    All other components within normal limits  LACTIC ACID, PLASMA - Abnormal; Notable for the following components:   Lactic Acid, Venous 3.0 (*)    All other components within normal limits  LACTIC ACID, PLASMA - Abnormal; Notable for the following components:   Lactic Acid, Venous 2.3 (*)    All other components within normal limits  TROPONIN I (HIGH SENSITIVITY) - Abnormal; Notable for the following components:   Troponin I (High Sensitivity) 112 (*)    All other components within normal limits  TROPONIN I (HIGH SENSITIVITY) - Abnormal; Notable for the following components:   Troponin I (High Sensitivity) 84 (*)    All other components within normal limits  CULTURE, BLOOD (SINGLE)  BRAIN NATRIURETIC PEPTIDE  URINALYSIS, MICROSCOPIC (REFLEX)   ____________________________________________  12 Lead EKG  Sinus rhythm, rate of 73 bpm.  Normal axis.  Left bundle.  No evidence of acute ischemia per [place] criteria. ____________________________________________  RADIOLOGY  ED MD interpretation: 2 view CXR reviewed by me with cardiomegaly and without evidence of acute cardiopulmonary pathology.  Official radiology report(s): DG Chest 2 View  Result Date: 02/09/2021 CLINICAL DATA:  Shortness of breath. Weakness. Hypotension. EXAM: CHEST - 2 VIEW COMPARISON:  09/11/2020 FINDINGS: Chronic cardiomegaly and aortic atherosclerosis. Dual lead pacemaker as seen previously. Lateral view is degraded by motion. No evidence of heart failure or effusion. No focal pulmonary finding. IMPRESSION: No active disease. Cardiomegaly and aortic atherosclerosis. Pacemaker. Cardiomegaly, aortic atherosclerosis and pacemaker. No active disease identified.  Electronically Signed   By: Paulina Fusi M.D.   On: 02/09/2021 09:51    ____________________________________________   PROCEDURES and INTERVENTIONS  Procedure(s) performed (including Critical Care):  .1-3 Lead EKG Interpretation Performed by: Delton Prairie, MD Authorized by: Delton Prairie, MD     Interpretation: normal     ECG rate:  80   ECG rate assessment: normal     Rhythm: sinus rhythm     Ectopy: none     Conduction: normal    Medications  sodium chloride 0.9 % bolus 1,000 mL (0 mLs Intravenous Stopped 02/09/21 1028)    ____________________________________________   MDM / ED COURSE   87 year old male presents from a local SNF after an episode of  weakness, dyspnea and diaphoresis, possibly symptomatic AS in the setting of mild AKI and dehydration, ultimately amenable to return to SNF.  He is stable throughout his stay in the ED.  Exam is largely unremarkable.  Following systolic murmur consistent with AS is noted.  No signs of trauma, neurologic or vascular deficits.  EKG is nonischemic and his blood work shows slightly worsening renal dysfunction, consistent with prerenal AKI in the setting of his poor intake at his SNF.  Leukocytosis is nonspecific and he has no further signs of an acute infectious pathology.  UA is clear no evidence of CAP.  His lactic acid is downtrending and troponins are downtrending.  I suspect a degree of poor intake causing dehydration and AKI, causing symptomatic AS.  I discussed this with the daughter and some uncertainty, but she is thankful for care and requests discharge back to his SNF for evaluation by hospice medicine, which I think is reasonable.  We discussed return precautions.  Clinical Course as of 02/09/21 1515  Fri Feb 09, 2021  8288 Discussed CODE STATUS with daughter in the hallway outside the room.  DNR and really focusing on comfort.  She is hesitant to even give him antibiotics if he has a pneumonia and really wants to keep him  comfortable and not prolong him unnecessarily [DS]  1105 Reassessed.  No complaints.  Doing well. [DS]  1415 Reassessed.  Resting comfortably on his side.  Discussed plan of care with daughter.  We discussed possible etiologies of what happened this morning.  We discussed aortic stenosis, AKI and possibly dehydrated.  We discussed downtrending troponins, improving lactic acid and nonspecific blood work.  We discussed no nidus of infection and no current indications for antibiotics.  We again discussed plan of care and that they want to reach out to palliative and hospice medicine to see if he would be appropriate for hospice.  I think this is appropriate.  We discussed possible undiagnosed infection or other pathology and she expressed understanding and is okay with the patient going back to his SNF.  We discussed return precautions. [DS]    Clinical Course User Index [DS] Delton Prairie, MD    ____________________________________________   FINAL CLINICAL IMPRESSION(S) / ED DIAGNOSES  Final diagnoses:  Aortic valve stenosis, etiology of cardiac valve disease unspecified  Generalized weakness  AKI (acute kidney injury) [facility])     ED Discharge Orders     None        Mia Milan   Note:  This document was prepared using Sales executive software and may include unintentional dictation errors.    Delton Prairie, MD 02/09/21 [phone]

## 2021-02-09 NOTE — ED Notes (Signed)
Patient transported to X-ray 

## 2021-02-09 NOTE — Discharge Instructions (Signed)
Timothy Ramirez seems somewhat dehydrated and got IV fluids.  No signs of pneumonia or UTI.  Please reach out to palliative and hospice medicine to discuss hospice care for him.

## 2021-02-09 NOTE — ED Notes (Signed)
RN to bedside with EDP. Pt has liter bag of fluids infusing.

## 2021-02-09 NOTE — ED Notes (Signed)
Pt assisted to stand at bedside by this RN and pt daughter to use urinal. Pt able to use urinal with assistance. Pt gave out of breath with minimal exertion. Pt 2 person assist.

## 2021-02-14 LAB — CULTURE, BLOOD (SINGLE)
Culture: NO GROWTH
Special Requests: ADEQUATE

## 2021-02-16 ENCOUNTER — Other Ambulatory Visit: Payer: Self-pay

## 2021-02-16 ENCOUNTER — Non-Acute Institutional Stay: Payer: Medicare Other | Admitting: Student

## 2021-02-16 DIAGNOSIS — Z515 Encounter for palliative care: Secondary | ICD-10-CM

## 2021-02-16 DIAGNOSIS — F0280 Dementia in other diseases classified elsewhere without behavioral disturbance: Secondary | ICD-10-CM

## 2021-02-16 NOTE — Progress Notes (Signed)
[facility] Pall[facility] Care Consult Note Telephone: [phone]  Fax: [phone]   Date of encounter: 02/16/21  PATIENT NAME: Timothy Ramirez [address] [place] [address]   [phone] (home)  DOB: Feb 17, 1934 MRN: [id number] PRIMARY CARE PROVIDER:    Virgie Dad, NP  REFERRING PROVIDER:   Virgie Dad, NP  RESPONSIBLE PARTY:    Contact Information     Name Relation Home Work Keener Daughter [phone]  [phone]   Alfio, [place] [phone]     Bertis Ruddy [phone]          I met face to face with patient in the facility. Palliative Care was asked to follow this patient by consultation request of  Virgie Dad, NP to address advance care planning and complex medical decision making. This is the initial visit.                                     ASSESSMENT AND PLAN / RECOMMENDATIONS:   Advance Care Planning/Goals of Care: Goals include to maximize quality of life and symptom management. Patient/health care surrogate gave his/her permission to discuss.Our advance care planning conversation included a discussion about:    The value and importance of advance care planning  Experiences with loved ones who have been seriously ill or have died  Exploration of personal, cultural or spiritual beliefs that might influence medical decisions  Exploration of goals of care in the event of a sudden injury or illness  CODE STATUS: DNR   Message left for family. Explained reason for Palliative Medicine visit. Will review GOC with family. Palliative will continue to provide supportive care.   Symptom Management/Plan:  Alzheimer's dementia-reorient/redirect as needed. Staff to assist with adl's. Monitor for falls/safety. Encourage resident out of bed as tolerated. Monitor for weight loss. Ongoing assessment for cognitive and functional declines.   Follow up Palliative Care Visit: Palliative  care will continue to follow for complex medical decision making, advance care planning, and clarification of goals. Return in 6-8 weeks or prn.  I spent 30 minutes providing this consultation. More than 50% of the time in this consultation was spent in counseling and care coordination.   PPS: 50%  HOSPICE ELIGIBILITY/DIAGNOSIS: TBD  Chief Complaint: Palliative Medicine initial consult.   HISTORY OF PRESENT ILLNESS:  Timothy Ramirez is a 87 y.o. year old male  with Alzheimer's dementia, CVA, aortic stenosis, complete heart block, status post pacemaker May 2022, CAD, hypertension, hyperlipidemia, depression, gerd, insomnia. ED visit on 12/23 due to shortness of breath, weakness.  Patient resides at [facility] and Dementia care unit. Staff report patient being stable. He was recently evaluated for Hospice but did not meet criteria at this time and therefore was referred to Palliative. He is able to complete most ADL's with assistance. He does ambulate, sometimes uses rollator walker. Completed PT in October 2022. His appetite is fair, weight is stable; goes to dining room for meals. He does sleep later into the morning and requires much encouragement to get out of bed. He is receiving temazepam for sleep.  History obtained from review of EMR, discussion with primary team, and interview with family, facility staff/caregiver and/or Mr. Lafon.  I reviewed available labs, medications, imaging, studies and related documents from the EMR.  Records reviewed and summarized above.   ROS  General: NAD EYES: denies vision changes ENMT: denies  dysphagia Cardiovascular: denies chest pain, denies DOE Pulmonary: denies cough, denies increased SOB Abdomen: endorses fair appetite, denies constipation GU: denies dysuria, endorses continence of urine MSK:  denies weakness Skin: denies rashes or wounds Neurological: denies pain, sleeping well Psych: Endorses stable mood Heme/lymph/immuno: denies  bruises, abnormal bleeding  Physical Exam: Weight: 195 pounds Pulse 60, resp 16, b/p 110/58, sats 95% on room air Constitutional: NAD General: frail appearing, WNWD  EYES: anicteric sclera, lids intact, no discharge  ENMT: intact hearing, oral mucous membranes moist, dentition intact CV: S1S2, RRR, no LE edema Pulmonary: LCTA, no increased work of breathing, no cough, room air Abdomen: normo-active BS + 4 quadrants, soft and non tender, no ascites GU: deferred MSK: moves all extremities, ambulatory Skin: warm and dry, no rashes or wounds on visible skin Neuro:  no generalized weakness, Alert and oriented to person, familiars Psych: non-anxious affect, pleasant Hem/lymph/immuno: no widespread bruising CURRENT PROBLEM LIST:  Patient Active Problem List   Diagnosis Date Noted   Stenosis of left vertebral artery    Acute stroke due to ischemia [facility])    Depression    Memory loss    Gastroenteritis 09/10/2020   Weakness 09/09/2020   Nausea & vomiting 09/09/2020   Complete heart block ([place]) 07/12/2020   DNR (do not resuscitate) 07/12/2020   Heart block AV third degree ([place]) 07/11/2020   Dementia without behavioral disturbance ([place])    Heart block 05/05/2020   CAD in native artery 05/05/2020   AV block, 2nd degree 12/24/2019   Bradycardia 11/08/2019   Constipation 11/08/2019   Elevated troponin 11/08/2019   Diabetes mellitus type 2, uncomplicated ([place]) 51/88/4166   Microhematuria 08/04/2019   BPH without obstruction/lower urinary tract symptoms 08/04/2019   Atypical chest pain 05/20/2018   Shortness of breath 05/20/2018   Severe aortic stenosis 05/20/2018   Nonrheumatic mitral valve regurgitation 05/20/2018   Essential hypertension 05/20/2018   Mixed hyperlipidemia 05/20/2018   PAST MEDICAL HISTORY:  Active Ambulatory Problems    Diagnosis Date Noted   Atypical chest pain 05/20/2018   Shortness of breath 05/20/2018   Severe aortic stenosis 05/20/2018   Nonrheumatic  mitral valve regurgitation 05/20/2018   Essential hypertension 05/20/2018   Mixed hyperlipidemia 05/20/2018   Microhematuria 08/04/2019   BPH without obstruction/lower urinary tract symptoms 08/04/2019   Bradycardia 11/08/2019   Constipation 11/08/2019   Elevated troponin 11/08/2019   Diabetes mellitus type 2, uncomplicated ([place]) 08/18/1599   AV block, 2nd degree 12/24/2019   Heart block 05/05/2020   CAD in native artery 05/05/2020   Heart block AV third degree ([place]) 07/11/2020   Dementia without behavioral disturbance (HCC)    Complete heart block ([place]) 07/12/2020   DNR (do not resuscitate) 07/12/2020   Weakness 09/09/2020   Nausea & vomiting 09/09/2020   Gastroenteritis 09/10/2020   Acute stroke due to ischemia [facility])    Depression    Memory loss    Stenosis of left vertebral artery    Resolved Ambulatory Problems    Diagnosis Date Noted   No Resolved Ambulatory Problems   Past Medical History:  Diagnosis Date   Aortic stenosis    Borderline diabetes    Coronary artery disease 01/2020   Hyperlipidemia    Hypertension    SOCIAL HX:  Social History   Tobacco Use   Smoking status: Never   Smokeless tobacco: Never  Substance Use Topics   Alcohol use: Not Currently    Alcohol/week: 2.0 standard drinks    Types: 1 Cans of  beer, 1 Shots of liquor per week    Comment: last drink was Saturday, 04/22/2020.   FAMILY HX:  Family History  Problem Relation Age of Onset   Congestive Heart Failure Mother    Heart attack Mother 67   Cirrhosis Father    COPD Sister       ALLERGIES: No Known Allergies   PERTINENT MEDICATIONS:  Outpatient Encounter Medications as of 02/16/2021  Medication Sig   amLODipine (NORVASC) 5 MG tablet Take 1 tablet (5 mg total) by mouth daily. (Patient not taking: Reported on 02/09/2021)   aspirin EC 81 MG tablet Take 81 mg by mouth daily. Swallow whole.   atorvastatin (LIPITOR) 20 MG tablet Take 20 mg by mouth daily.   Cholecalciferol (D3-1000 PO)  Take 1 tablet by mouth daily.   citalopram (CELEXA) 20 MG tablet Take 20 mg by mouth daily.   esomeprazole (NEXIUM) 20 MG capsule Take 1 capsule (20 mg total) by mouth daily.   famotidine (PEPCID) 20 MG tablet Take 20 mg by mouth daily.   feeding supplement (ENSURE ENLIVE / ENSURE PLUS) LIQD Take 237 mLs by mouth daily.   memantine (NAMENDA) 5 MG tablet Take 5 mg by mouth 2 (two) times daily.   metoprolol succinate (TOPROL-XL) 25 MG 24 hr tablet Take 1 tablet (25 mg total) by mouth daily. (Patient not taking: Reported on 02/09/2021)   ondansetron (ZOFRAN) 4 MG tablet Take 1 tablet by mouth every 12 (twelve) hours as needed.   polyethylene glycol (MIRALAX / GLYCOLAX) 17 g packet Take 17 g by mouth daily as needed for mild constipation or moderate constipation.   temazepam (RESTORIL) 15 MG capsule Take 1 capsule (15 mg total) by mouth at bedtime as needed for sleep.   vitamin B-12 (CYANOCOBALAMIN) 1000 MCG tablet Take 1,000 mcg by mouth daily.   No facility-administered encounter medications on file as of 02/16/2021.   Thank you for the opportunity to participate in the care of Mr. Artman.  The palliative care team will continue to follow. Please call our office at [phone] if we can be of additional assistance.   Ezekiel Slocumb, NP   COVID-19 PATIENT SCREENING TOOL Asked and negative response unless otherwise noted:  Have you had symptoms of covid, tested positive or been in contact with someone with symptoms/positive test in the past 5-10 days? No

## 2021-02-20 ENCOUNTER — Telehealth: Payer: Self-pay | Admitting: Student

## 2021-02-20 NOTE — Telephone Encounter (Signed)
Palliative NP spoke with daughter Daine Floras to discuss recent palliative visit. Would like to limit hospitalization if possible.

## 2021-03-30 ENCOUNTER — Other Ambulatory Visit: Payer: Self-pay

## 2021-03-30 ENCOUNTER — Non-Acute Institutional Stay: Payer: Medicare Other | Admitting: Student

## 2021-03-30 DIAGNOSIS — F0280 Dementia in other diseases classified elsewhere without behavioral disturbance: Secondary | ICD-10-CM

## 2021-03-30 DIAGNOSIS — Z515 Encounter for palliative care: Secondary | ICD-10-CM

## 2021-03-30 DIAGNOSIS — F339 Major depressive disorder, recurrent, unspecified: Secondary | ICD-10-CM

## 2021-03-30 DIAGNOSIS — G309 Alzheimer's disease, unspecified: Secondary | ICD-10-CM

## 2021-03-30 NOTE — Progress Notes (Signed)
[facility] Consult Note Telephone: [phone]  Fax: [phone]    Date of encounter: 03/30/21 10:08 AM PATIENT NAME: Timothy Ramirez [address] [place] [address]   [phone] (home)  DOB: [birth year] MRN: [id number] PRIMARY CARE PROVIDER:    Virgie Dad, NP  REFERRING PROVIDER:   Virgie Dad, NP  RESPONSIBLE PARTY:    Contact Information     Name Relation Home Work [place] Daughter [phone]  [phone]   Timothy Ramirez [phone]     Timothy Ramirez [phone]          I met face to face with patient in the facility. Palliative Care was asked to follow this patient by consultation request of Timothy Dad, NP  to address advance care planning and complex medical decision making. This is a follow up visit.                                   ASSESSMENT AND PLAN / RECOMMENDATIONS:   Advance Care Planning/Goals of Care: Goals include to maximize quality of life and symptom management. Patient/health care surrogate gave his/her permission to discuss.  CODE STATUS: DNR  Symptom Management/Plan:  Alzheimer's dementia-patient has been stable per staff. Reorient/redirect as needed. Staff to assist with adl's. Monitor for falls/safety. Encourage resident out of bed as tolerated. Monitor for weight loss. Ongoing assessment for cognitive and functional declines.  Depression- mood is stable. He states he prefers to stay in his room and in bed. Continue citalopram as directed. Monitor for worsening depression.   Follow up Palliative Care Visit: Palliative care will continue to follow for complex medical decision making, advance care planning, and clarification of goals. Return in 6-8 weeks or prn.  This visit was coded based on medical decision making (MDM).  PPS: 50%  HOSPICE ELIGIBILITY/DIAGNOSIS: TBD  Chief Complaint: Palliative Medicine follow up visit.   HISTORY  OF PRESENT ILLNESS:  Timothy Ramirez is a 88 y.o. year old male  with Alzheimer's dementia, CVA, aortic stenosis, complete heart block, status post pacemaker May 2022, CAD, hypertension, hyperlipidemia, depression, gerd, insomnia.  Patient resides at [facility]. Namenda being tapered down and then discontinued as of 03/15/21. Patient was diagnosed with Covid in January 2023; mild symptoms reported. Currently receiving temazepam 15 mg QHS for sleep. Patient states his mood is stable. He states he prefers to be in his room and stay in bed. He endorses a good appetite; goes to dining room for meals but does require much coaxing. No recent falls reported. No recent ED visits or hospitalizations. A 10 point review of systems is negative, except for the pertinent positives and negatives detailed in the HPI.  History obtained from review of EMR, discussion with primary team, and interview with family, facility staff/caregiver and/or Timothy Ramirez.  I reviewed available labs, medications, imaging, studies and related documents from the EMR.  Records reviewed and summarized above.   Physical Exam: Weight: 192.4 pounds Pulse 61, resp 16, bp 134/68 sats 95% on room air Constitutional: NAD General: frail appearing EYES: anicteric sclera, lids intact, no discharge  ENMT: intact hearing, oral mucous membranes moist, dentition intact CV: S1S2, RRR, no LE edema Pulmonary: LCTA, no increased work of breathing, no cough, room air Abdomen: normo-active BS + 4 quadrants, soft and non tender, no ascites GU: deferred MSK: no sarcopenia, moves all extremities,  ambulatory Skin: warm and dry, no rashes or wounds on visible skin Neuro: generalized weakness, A & O to person, familiars Psych: non-anxious affect, pleasant Hem/lymph/immuno: no widespread bruising   Thank you for the opportunity to participate in the care of Timothy Ramirez.  The palliative care team will continue to follow.  Please call our office at [phone] if we can be of additional assistance.   Ezekiel Slocumb, NP   COVID-19 PATIENT SCREENING TOOL Asked and negative response unless otherwise noted:   Have you had symptoms of covid, tested positive or been in contact with someone with symptoms/positive test in the past 5-10 days? No

## 2021-04-12 ENCOUNTER — Ambulatory Visit (INDEPENDENT_AMBULATORY_CARE_PROVIDER_SITE_OTHER): Payer: Medicare Other

## 2021-04-12 DIAGNOSIS — I441 Atrioventricular block, second degree: Secondary | ICD-10-CM | POA: Diagnosis not present

## 2021-04-13 LAB — CUP PACEART REMOTE DEVICE CHECK
Battery Remaining Longevity: 126 mo
Battery Voltage: 3.05 V
Brady Statistic AP VP Percent: 63.88 %
Brady Statistic AP VS Percent: 0 %
Brady Statistic AS VP Percent: 35.9 %
Brady Statistic AS VS Percent: 0.21 %
Brady Statistic RA Percent Paced: 63.71 %
Brady Statistic RV Percent Paced: 99.78 %
Date Time Interrogation Session: 20230223092128
Implantable Lead Implant Date: 20220525
Implantable Lead Implant Date: 20220525
Implantable Lead Location: 753859
Implantable Lead Location: 753860
Implantable Lead Model: 5076
Implantable Lead Model: 5076
Implantable Pulse Generator Implant Date: 20220525
Lead Channel Impedance Value: 323 Ohm
Lead Channel Impedance Value: 418 Ohm
Lead Channel Impedance Value: 456 Ohm
Lead Channel Impedance Value: 475 Ohm
Lead Channel Pacing Threshold Amplitude: 0.625 V
Lead Channel Pacing Threshold Amplitude: 0.75 V
Lead Channel Pacing Threshold Pulse Width: 0.4 ms
Lead Channel Pacing Threshold Pulse Width: 0.4 ms
Lead Channel Sensing Intrinsic Amplitude: 10.125 mV
Lead Channel Sensing Intrinsic Amplitude: 10.125 mV
Lead Channel Sensing Intrinsic Amplitude: 2.25 mV
Lead Channel Sensing Intrinsic Amplitude: 2.25 mV
Lead Channel Setting Pacing Amplitude: 1.75 V
Lead Channel Setting Pacing Amplitude: 2.5 V
Lead Channel Setting Pacing Pulse Width: 0.4 ms
Lead Channel Setting Sensing Sensitivity: 1.2 mV

## 2021-04-17 NOTE — Progress Notes (Signed)
Remote pacemaker transmission.   

## 2021-05-12 IMAGING — MR MR HEAD W/O CM
15 of 18 series · 34 of 48 positions shown · non-contrast
Comparison: CT head 06/22/2009

CLINICAL DATA: Mild dementia.

EXAM:
MRI HEAD WITHOUT CONTRAST
TECHNIQUE: Multiplanar, multiecho pulse sequences of the brain and surrounding
structures were obtained without intravenous contrast.
Additionally, using NeuroQuant software a 3D volumetric analysis of
the brain was performed and is compared to a normative database
adjusted for age, gender and intracranial volume.

[Series 3: DWI · axial · 3.0mm · 0.94mm/px · 1 of 94 slices shown (1 of 2)]
[im 1/94]
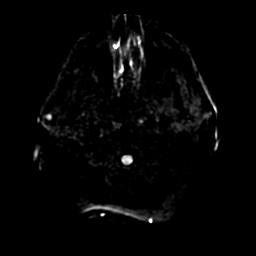

[Series 4: T1 · sagittal · 1.2mm · 0.94mm/px · 2 of 160 slices shown]
[im 1/160]
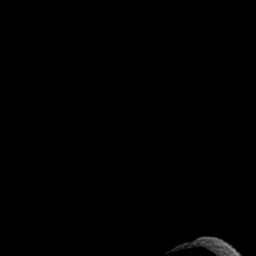
[im 160/160]
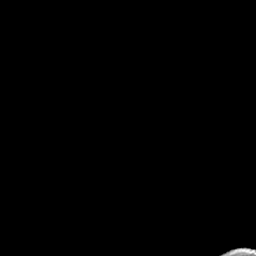

[Series 5: DWI · coronal · 4.0mm · 0.94mm/px · 1 of 72 slices shown (2 of 2)]
[im 1/72]
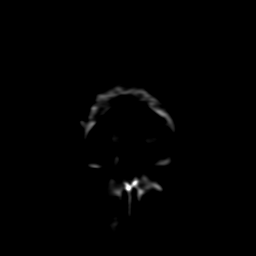

[Series 6: FLAIR · sagittal · 5.0mm · 0.23mm/px · 1 of 23 slices shown (1 of 2)]
[im 1/23]
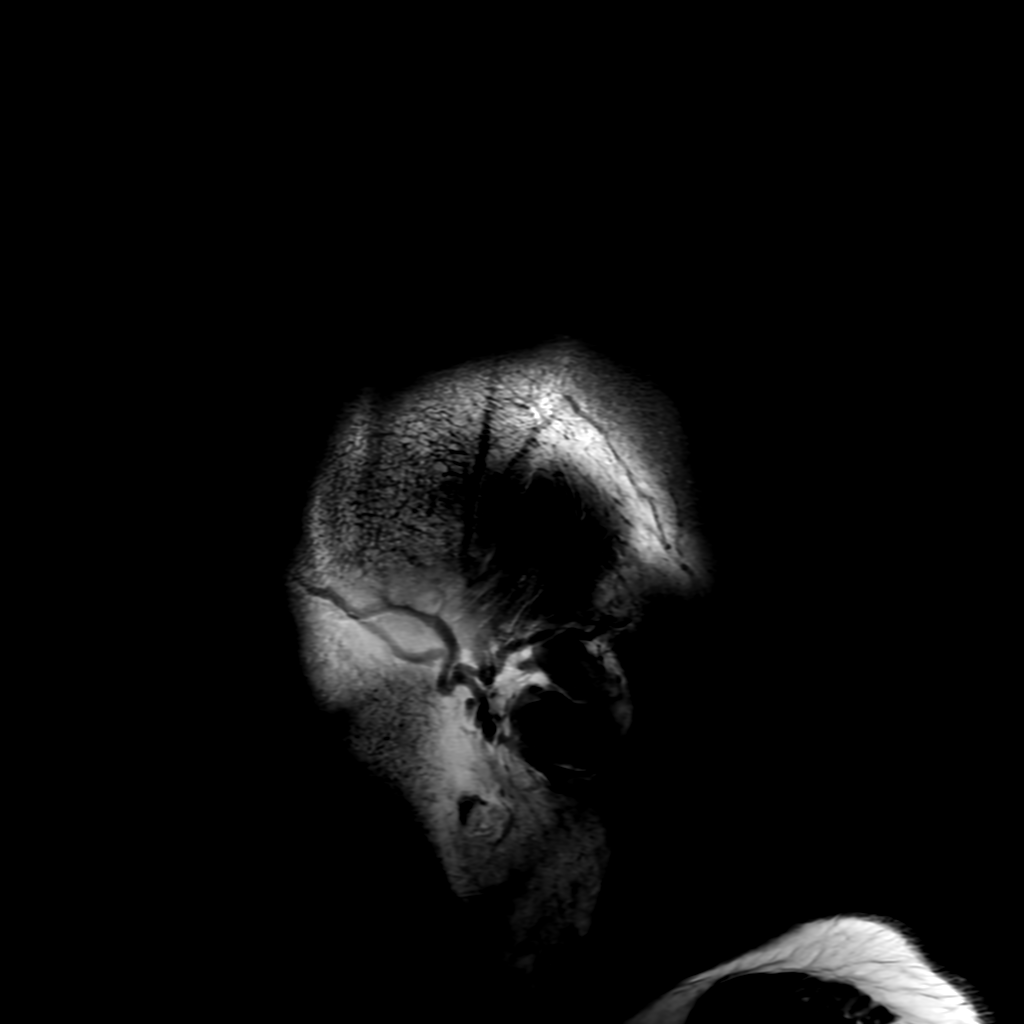

[Series 8: FLAIR · axial · 3.0mm · 0.45mm/px · 1 of 23 slices shown (2 of 2)]
[im 1/23]
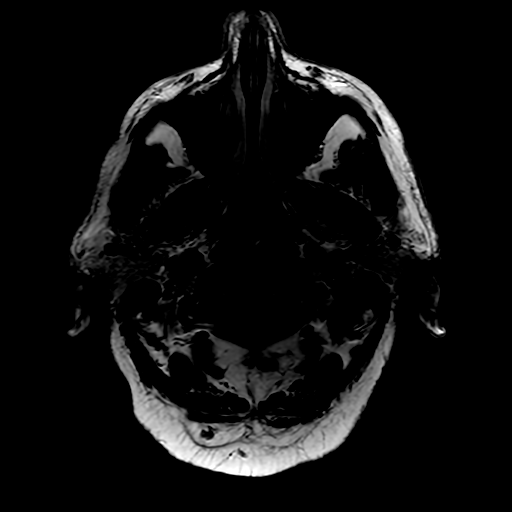

[Series 9: (person_name) · axial · 3.0mm · 0.47mm/px · z∈[-69,+78]mm · 2 of 100 slices shown]
[im 1/100]
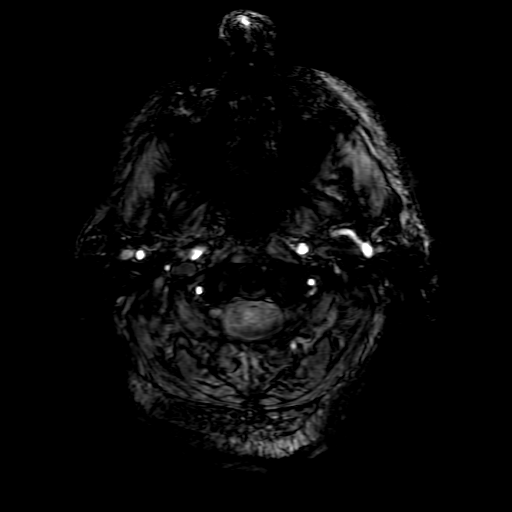
[im 100/100]
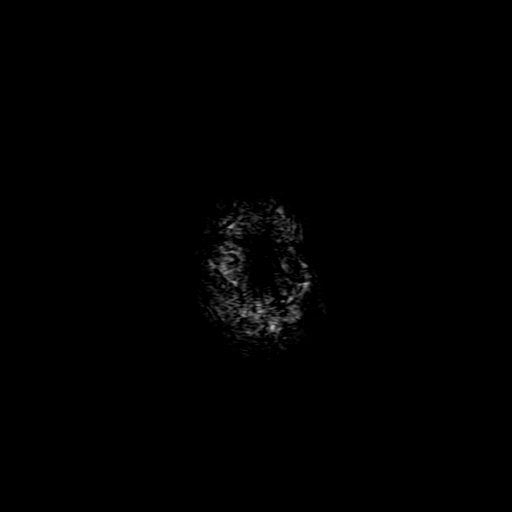

[Series 10: ax 3(person_name) pre · axial · non-contrast · 3.0mm · 0.94mm/px · 1 of 56 slices shown]
[im 1/56]
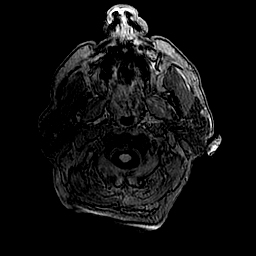

[Series 11: T2 · coronal · 5.0mm · 0.20mm/px · 1 of 29 slices shown]
[im 1/29]
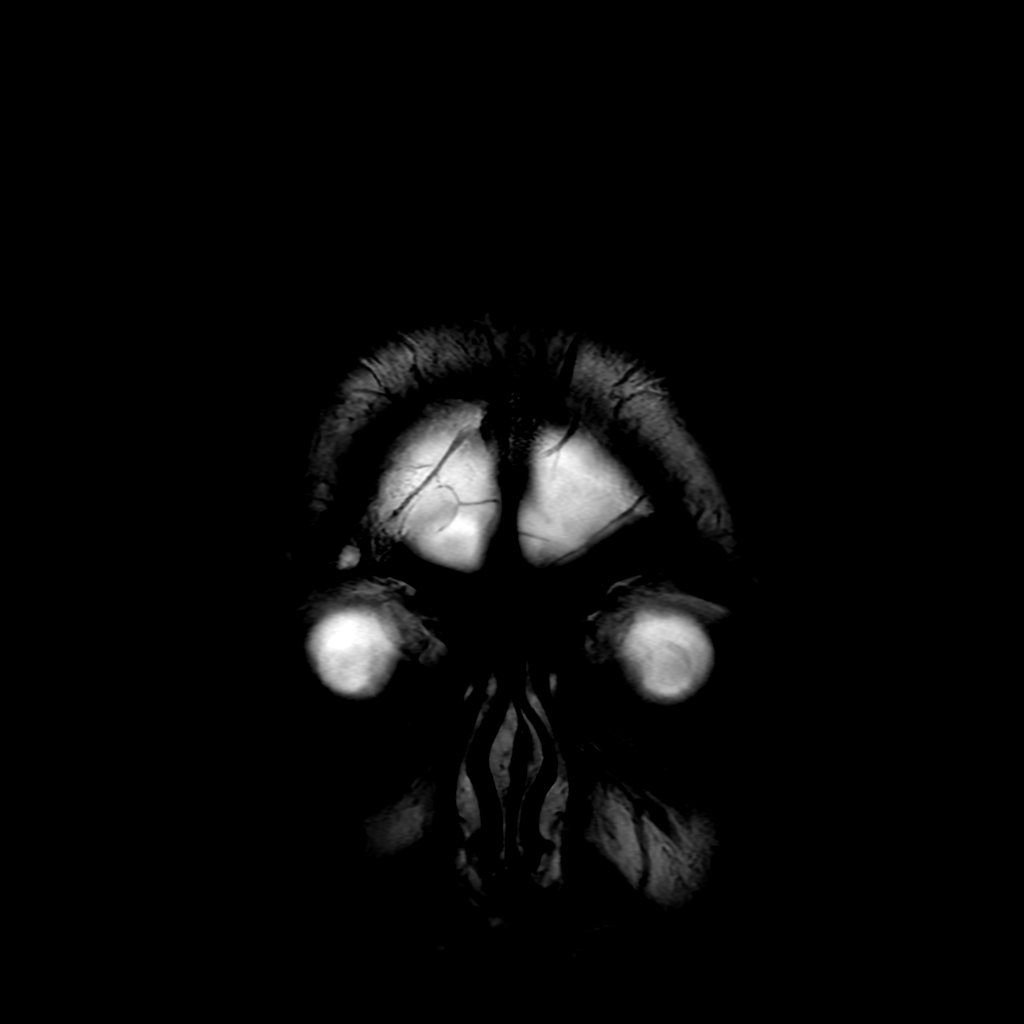

[Series 52: nqsegcorsc · 1.00mm/px · 5 of 238 slices shown (1 of 4)]
[im 1/238]
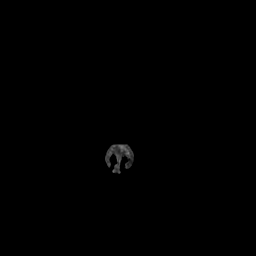
[im 60/238]
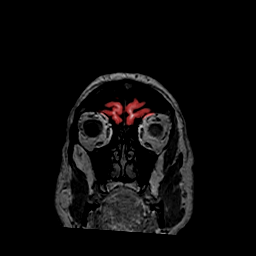
[im 119/238]
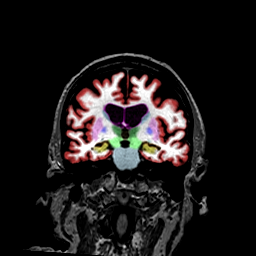
[im 178/238]
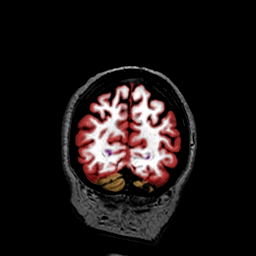
[im 238/238]
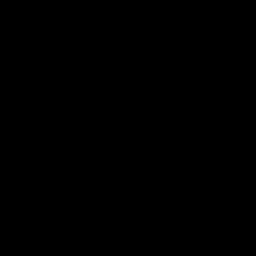

[Series 52: nqsegcorsc · 1.00mm/px · 5 of 238 slices shown (2 of 4)]
[im 1/238]
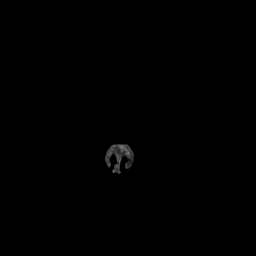
[im 60/238]
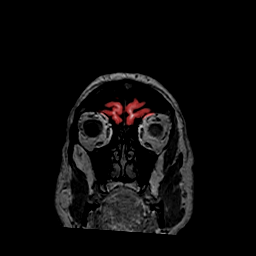
[im 119/238]
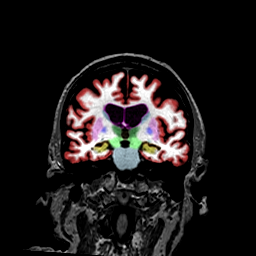
[im 178/238]
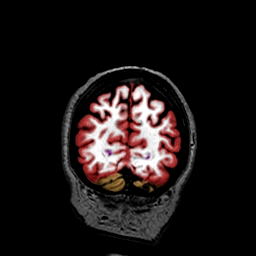
[im 238/238]
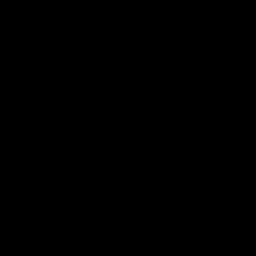

[Series 52: nqsegcorsc · 1.00mm/px · 5 of 238 slices shown (3 of 4)]
[im 1/238]
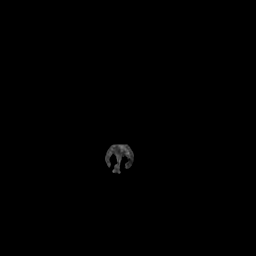
[im 60/238]
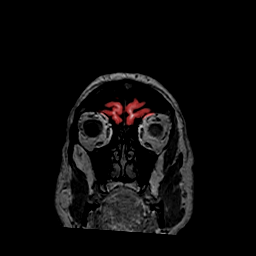
[im 119/238]
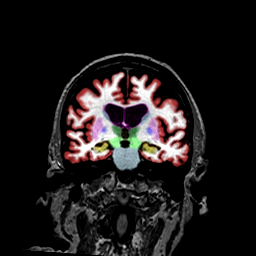
[im 178/238]
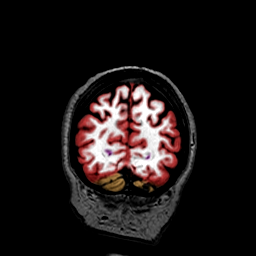
[im 238/238]
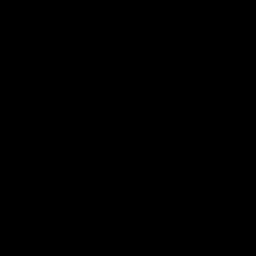

[Series 52: nqsegcorsc · 1.00mm/px · 5 of 238 slices shown (4 of 4)]
[im 1/238]
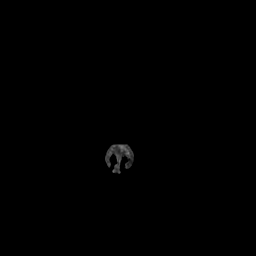
[im 60/238]
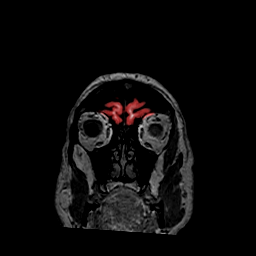
[im 119/238]
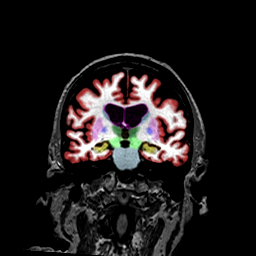
[im 178/238]
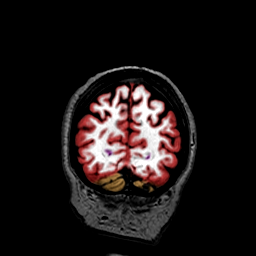
[im 238/238]
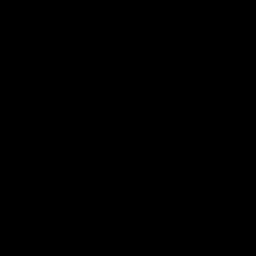

[Series 53: nqsegaxlsc · 1.00mm/px · 2 of 219 slices shown]
[im 1/219]
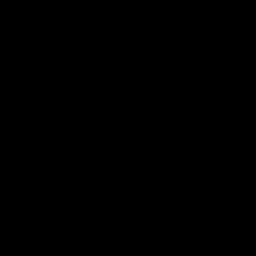
[im 73/219]
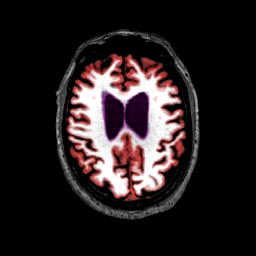

[Series 350: ADC · axial · 3.0mm · 0.94mm/px · 1 of 47 slices shown (1 of 2)]
[im 1/47]
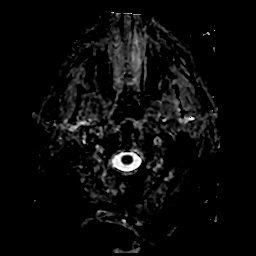

[Series 550: ADC · coronal · 4.0mm · 0.94mm/px · 1 of 36 slices shown (2 of 2)]
[im 1/36]
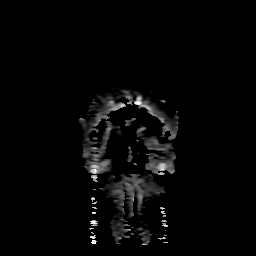

[34 of 48 positions shown; findings below may reference images not displayed]

FINDINGS: Brain: Diffusion-weighted imaging negative for acute infarct. Mild
white matter changes with mild periventricular white matter
hyperintensity and a few small focal white matter hyperintensities.
Small hyperintensity in the right thalamus. Chronic infarct in the
right inferior and posterior cerebellum. Solitary chronic
microhemorrhage right frontal lobe. Negative for mass lesion.

Vascular: Normal arterial flow voids.

Skull and upper cervical spine: No focal skeletal lesion.

Sinuses/Orbits: Paranasal sinuses clear. Bilateral cataract
extraction.

Other: None

NeuroQuant Findings:

Volumetric analysis of the brain was performed, with a fully
detailed report in [HOSPITAL] PACS. Briefly, the comparison with age and
gender matched reference reveals cortical gray matter volume
approximately 32% of age matched normal. Hippocampus volume
approximately 8% of age matched normal.
IMPRESSION: 1. Generalized atrophy. Very mild white matter changes. Chronic
microhemorrhage in the right frontal lobe. No acute abnormality.
2. NeuroQuant volumetric analysis of the brain, see details on
[HOSPITAL] PACS.

## 2021-05-29 ENCOUNTER — Non-Acute Institutional Stay: Payer: Medicare Other | Admitting: Student

## 2021-05-29 DIAGNOSIS — F0280 Dementia in other diseases classified elsewhere without behavioral disturbance: Secondary | ICD-10-CM

## 2021-05-29 DIAGNOSIS — F32A Depression, unspecified: Secondary | ICD-10-CM

## 2021-05-29 DIAGNOSIS — Z515 Encounter for palliative care: Secondary | ICD-10-CM

## 2021-05-29 NOTE — Progress Notes (Signed)
? ? ?[facility] Consult Note ?Telephone: [phone]  ?Fax: [phone]  ? ? ?Date of encounter: 05/29/21 ?2:38 PM ?PATIENT NAME: Timothy Ramirez ?Timothy Ramirez [id number]   ?[phone] (home)  ?DOB: 14-Nov-1933 ?MRN: [id number] ?PRIMARY CARE PROVIDER:    ?Virgie Dad, NP ? ?REFERRING PROVIDER:   ?Virgie Dad, NP ? ?RESPONSIBLE PARTY:    ?Contact Information   ? ? Name Relation Home Work [place]  ? Beckey Rutter Daughter [phone]  [phone]  ? Scotty, [place] Son [phone]    ? Gammon,Jennifer Granddaughter [phone]    ? ?  ? ? ? ?I met face to face with patient in the facility. Palliative Care was asked to follow this patient by consultation request of   to address advance care planning and complex medical decision making. This is a follow up visit. ? ?Update provided to daughter [name] via telephone. ? ?                                 ASSESSMENT AND PLAN / RECOMMENDATIONS:  ? ?Advance Care Planning/Goals of Care: Goals include to maximize quality of life and symptom management. Patient/health care surrogate gave his/her permission to discuss. ?Our advance care planning conversation included a discussion about:    ?The value and importance of advance care planning  ?Experiences with loved ones who have been seriously ill or have died  ?Exploration of personal, cultural or spiritual beliefs that might influence medical decisions  ?CODE STATUS: DNR ? ?Symptom Management/Plan: ? ?Alzheimer's dementia-continue to reorient and redirect as needed.  Staff to assist with ADLs as needed.  Encourage resident out of bed as tolerated.  Ongoing assessment for cognitive and functional declines.  Monitor for worsening behaviors and agitation. ? ?Depression-mood has been stable; continue citalopram as directed. ? ?Pain-generalized pain; continue acetaminophen PRN. ? ?Follow up Palliative Care Visit: Palliative care will continue to follow for complex  medical decision making, advance care planning, and clarification of goals. Return in 8 weeks or prn. ? ? ?This visit was coded based on medical decision making (MDM). ? ?PPS: 50% ? ?HOSPICE ELIGIBILITY/DIAGNOSIS: TBD ? ?Chief Complaint: Palliative Medicine follow up visit.  ? ?HISTORY OF PRESENT ILLNESS:  Timothy Ramirez is a 88 y.o. year old male  with  Alzheimer's dementia, CVA, aortic stenosis, complete heart block, status post pacemaker May 2022, CAD, hypertension, hyperlipidemia, depression, gerd, insomnia.  ? ?Patient resides at [facility].  Staff reports patient being stable. He denies pain; daughter reports he will occasionally c/o generalized pain and receives acetaminophen with relief. No edema or shortness of breath. Staff does report patient had started to exhibit increased sexual behaviors; he was started on olanzapine as needed which has been effective per staff.  Good appetite endorsed; he does require much coaxing to go to dining room.  He also eats snacks that family brings in.  Weight has been stable.  No recent falls or injury.  He was treated empirically recently for a urinary tract infection in the past 2 weeks; he denies any urinary complaints at this time.  A 10 point review of systems is negative, except for the pertinent positives and negatives detailed in the HPI.   ? ?History obtained from review of EMR, discussion with primary team, and interview with family, facility staff/caregiver and/or Timothy Ramirez.  ?I reviewed available labs, medications, imaging, studies and related documents  from the EMR.  Records reviewed and summarized above.  ? ? ?Physical Exam: ?Weight: 196.4 pounds ?Pulse 62, resp 16, 140/90, sats 93% on room air ?Constitutional: NAD ?General: frail appearing ?EYES: anicteric sclera, lids intact, no discharge  ?ENMT: intact hearing, oral mucous membranes moist, dentition intact ?CV: S1S2, RRR, no LE edema ?Pulmonary: LCTA, no increased work  of breathing, no cough, room air ?Abdomen: intake 100%, normo-active BS + 4 quadrants, soft and non tender, no ascites ?GU: deferred ?MSK: moves all extremities, ambulatory ?Skin: warm and dry, no rashes or wounds on visible skin ?Neuro: + generalized weakness, A & O to person, familiars ?Psych: non-anxious affect, pleasant ?Hem/lymph/immuno: no widespread bruising ? ? ?Thank you for the opportunity to participate in the care of Timothy Ramirez.  The palliative care team will continue to follow. Please call our office at [phone] if we can be of additional assistance.  ? ?Ezekiel Slocumb, NP  ? ?COVID-19 PATIENT SCREENING TOOL ?Asked and negative response unless otherwise noted:  ? ?Have you had symptoms of covid, tested positive or been in contact with someone with symptoms/positive test in the past 5-10 days? No ? ?

## 2021-07-12 ENCOUNTER — Ambulatory Visit (INDEPENDENT_AMBULATORY_CARE_PROVIDER_SITE_OTHER): Payer: Medicare Other

## 2021-07-12 DIAGNOSIS — I441 Atrioventricular block, second degree: Secondary | ICD-10-CM

## 2021-07-14 LAB — CUP PACEART REMOTE DEVICE CHECK
Battery Remaining Longevity: 122 mo
Battery Voltage: 3.02 V
Brady Statistic AP VP Percent: 29.04 %
Brady Statistic AP VS Percent: 0 %
Brady Statistic AS VP Percent: 70.58 %
Brady Statistic AS VS Percent: 0.38 %
Brady Statistic RA Percent Paced: 28.96 %
Brady Statistic RV Percent Paced: 99.62 %
Date Time Interrogation Session: 20230525070424
Implantable Lead Implant Date: 20220525
Implantable Lead Implant Date: 20220525
Implantable Lead Location: 753859
Implantable Lead Location: 753860
Implantable Lead Model: 5076
Implantable Lead Model: 5076
Implantable Pulse Generator Implant Date: 20220525
Lead Channel Impedance Value: 323 Ohm
Lead Channel Impedance Value: 380 Ohm
Lead Channel Impedance Value: 437 Ohm
Lead Channel Impedance Value: 437 Ohm
Lead Channel Pacing Threshold Amplitude: 0.625 V
Lead Channel Pacing Threshold Amplitude: 0.75 V
Lead Channel Pacing Threshold Pulse Width: 0.4 ms
Lead Channel Pacing Threshold Pulse Width: 0.4 ms
Lead Channel Sensing Intrinsic Amplitude: 14.875 mV
Lead Channel Sensing Intrinsic Amplitude: 14.875 mV
Lead Channel Sensing Intrinsic Amplitude: 2.75 mV
Lead Channel Sensing Intrinsic Amplitude: 2.75 mV
Lead Channel Setting Pacing Amplitude: 1.5 V
Lead Channel Setting Pacing Amplitude: 2.5 V
Lead Channel Setting Pacing Pulse Width: 0.4 ms
Lead Channel Setting Sensing Sensitivity: 1.2 mV

## 2021-07-24 NOTE — Progress Notes (Signed)
Remote pacemaker transmission.   

## 2021-08-08 ENCOUNTER — Non-Acute Institutional Stay: Payer: Medicare Other | Admitting: Student

## 2021-08-08 DIAGNOSIS — F339 Major depressive disorder, recurrent, unspecified: Secondary | ICD-10-CM

## 2021-08-08 DIAGNOSIS — G309 Alzheimer's disease, unspecified: Secondary | ICD-10-CM

## 2021-08-08 DIAGNOSIS — Z515 Encounter for palliative care: Secondary | ICD-10-CM

## 2021-08-08 NOTE — Progress Notes (Unsigned)
[facility] Consult Note Telephone: [phone]  Fax: [phone]    Date of encounter: 08/08/21  PATIENT NAME: Timothy Ramirez [id number]   [phone] (home)  DOB: 1933/03/05 MRN: [id number] PRIMARY CARE PROVIDER:    Maryland Pink, MD,  [place] [address] [phone]  REFERRING PROVIDER:   Maryland Pink, MD [address] [place],  [place] [address] [phone]  RESPONSIBLE PARTY:    Contact Information     Name Relation Home Work [place] Daughter [phone]  [phone]   Buren, Havey [phone]     Bertis Ruddy [phone]          I met face to face with patient and family in *** home/facility. Palliative Care was asked to follow this patient by consultation request of  Maryland Pink, MD to address advance care planning and complex medical decision making. This is a follow up visit.                                   ASSESSMENT AND PLAN / RECOMMENDATIONS:   Advance Care Planning/Goals of Care: Goals include to maximize quality of life and symptom management. Patient/health care surrogate gave his/her permission to discuss. Our advance care planning conversation included a discussion about:    The value and importance of advance care planning  Experiences with loved ones who have been seriously ill or have died  Exploration of personal, cultural or spiritual beliefs that might influence medical decisions  Exploration of goals of care in the event of a sudden injury or illness  Identification of a healthcare agent  Review and updating or creation of an  advance directive document . Decision not to resuscitate or to de-escalate disease focused treatments due to poor prognosis. CODE STATUS:  Symptom Management/Plan:    Follow up Palliative Care Visit: Palliative care will continue to  follow for complex medical decision making, advance care planning, and clarification of goals. Return *** weeks or prn.  I spent *** minutes providing this consultation. More than 50% of the time in this consultation was spent in counseling and care coordination.  This visit was coded based on medical decision making (MDM).***  PPS: ***0%  HOSPICE ELIGIBILITY/DIAGNOSIS: TBD  Chief Complaint: ***  HISTORY OF PRESENT ILLNESS:  Timothy Ramirez is a 88 y.o. year old male  with *** .   History obtained from review of EMR, discussion with primary team, and interview with family, facility staff/caregiver and/or Timothy Ramirez.  I reviewed available labs, medications, imaging, studies and related documents from the EMR.  Records reviewed and summarized above.   ROS  *** General: NAD EYES: denies vision changes ENMT: denies dysphagia Cardiovascular: denies chest pain, denies DOE Pulmonary: denies cough, denies increased SOB Abdomen: endorses good appetite, denies constipation, endorses continence of bowel GU: denies dysuria, endorses continence of urine MSK:  denies increased weakness,  no falls reported Skin: denies rashes or wounds Neurological: denies pain, denies insomnia Psych: Endorses positive mood Heme/lymph/immuno: denies bruises, abnormal bleeding  Physical Exam: Current and past weights: Constitutional: NAD General: frail appearing, thin/WNWD/obese  EYES: anicteric sclera, lids intact, no discharge  ENMT: intact hearing, oral mucous membranes moist, dentition intact CV: S1S2, RRR, no LE edema Pulmonary: LCTA, no increased work of breathing, no cough, room air Abdomen: intake  100%, normo-active BS + 4 quadrants, soft and non tender, no ascites GU: deferred MSK: no sarcopenia, moves all extremities, ambulatory Skin: warm and dry, no rashes or wounds on visible skin Neuro:  no generalized weakness,  no cognitive impairment Psych: non-anxious affect, A and O x  3 Hem/lymph/immuno: no widespread bruising   Thank you for the opportunity to participate in the care of Timothy Ramirez.  The palliative care team will continue to follow. Please call our office at [phone] if we can be of additional assistance.   Ezekiel Slocumb, NP   COVID-19 PATIENT SCREENING TOOL Asked and negative response unless otherwise noted:   Have you had symptoms of covid, tested positive or been in contact with someone with symptoms/positive test in the past 5-10 days?

## 2021-09-21 ENCOUNTER — Non-Acute Institutional Stay: Payer: Medicare Other | Admitting: Student

## 2021-09-21 DIAGNOSIS — G309 Alzheimer's disease, unspecified: Secondary | ICD-10-CM

## 2021-09-21 DIAGNOSIS — R600 Localized edema: Secondary | ICD-10-CM

## 2021-09-21 DIAGNOSIS — Z515 Encounter for palliative care: Secondary | ICD-10-CM

## 2021-09-21 NOTE — Progress Notes (Signed)
[facility] Consult Note Telephone: [phone]  Fax: [phone]    Date of encounter: 09/21/21 12:42 PM PATIENT NAME: [place] [address] [place] [address]   [phone] (home)  DOB: 06/05/1933 MRN: [id number] PRIMARY CARE PROVIDER:    Maryland Pink, MD,  [place] [address] [phone]  REFERRING PROVIDER:   Cathie Beams  RESPONSIBLE PARTY:    Contact Information     Name Relation Home Work [place] Daughter [phone]  [phone]   Kline, [place] [phone]     Bertis Ruddy [phone]          I met face to face with patient in the facility. Palliative Care was asked to follow this patient by consultation request of  [facility] to address advance care planning and complex medical decision making. This is a follow up visit.                                   ASSESSMENT AND PLAN / RECOMMENDATIONS:   Advance Care Planning/Goals of Care: Goals include to maximize quality of life and symptom management. Patient/health care surrogate gave his/her permission to discuss. CODE STATUS: DNR  Education provided on palliative medicine.  We will continue to provide supportive care, monitor for changes and declines, symptom management as needed.  Symptom Management/Plan:  Alzheimer's dementia-patient has been stable per facility staff. Continue to reorient and redirect as needed.  Staff to assist with ADLs.  Patient continues to sleep throughout the day, encourage resident out of bed as tolerated. He is going to dining room for meals with much encouragement. Continue memantine, Risperdal, mirtazapine as directed. Patient continues to be followed by psychiatry.  LE edema-patient with trace lower extremity edema. Continue hctz 12.5 mg daily, elevate legs when in bed or chair.   Follow up Palliative Care Visit:  Palliative care will continue to follow for complex medical decision making, advance care planning, and clarification of goals. Return in 8 weeks or prn.   This visit was coded based on medical decision making (MDM).  PPS: 40%  HOSPICE ELIGIBILITY/DIAGNOSIS: TBD  Chief Complaint: Palliative Medicine follow up visit.   HISTORY OF PRESENT ILLNESS:  CHRISTINO MCGLINCHEY is a 88 y.o. year old male  with Alzheimer's dementia, CVA, aortic stenosis, complete heart block, status post pacemaker May 2022, CAD, hypertension, hyperlipidemia, depression, GERD, insomnia.     Patient resides at [facility]. Patient has been stable per staff. No worsening in sexual behaviors. No reports of pain, denies shortness of breath. LE edema per staff. Patient continues to nap throughout the day. He is out of bed to dining room daily for meals, but requires much encouragement. No recent falls or injury reported. No recent infections. Patient received resting in bed. He arouses to verbal stimulation. He does answer direct questions, but keeps his eyes closed throughout visit. Staff also contributes to HPI due to his dementia.  History obtained from review of EMR, discussion with primary team, and interview with family, facility staff/caregiver and/or Mr. Cleaver.  I reviewed available labs, medications, imaging, studies and related documents from the EMR.  Records reviewed and summarized above.    Physical Exam: Pulse 72, resp 16, b/p 120/70, sats 94% on room air Constitutional: NAD General: frail appearing  EYES: anicteric sclera, lids intact, no discharge  ENMT: intact hearing, oral mucous membranes moist CV: S1S2, RRR, trace LE edema Pulmonary: LCTA, no increased work of breathing, no cough, room air Abdomen: normo-active BS + 4 quadrants, soft and non tender GU: deferred MSK: moves all extremities, ambulatory Skin: warm and dry, no rashes or wounds on visible skin Neuro:   generalized weakness, A & O to person, familiars Psych: non-anxious affect Hem/lymph/immuno: no widespread bruising   Thank you for the opportunity to participate in the care of Mr. Mcray.  The palliative care team will continue to follow. Please call our office at [phone] if we can be of additional assistance.   Ezekiel Slocumb, NP   COVID-19 PATIENT SCREENING TOOL Asked and negative response unless otherwise noted:   Have you had symptoms of covid, tested positive or been in contact with someone with symptoms/positive test in the past 5-10 days? No

## 2021-09-24 ENCOUNTER — Telehealth: Payer: Self-pay | Admitting: Student

## 2021-09-24 NOTE — Telephone Encounter (Signed)
Palliative NP spoke with daughter Lynnell Dike to provide update on recent visit. No recent changes. Will continue to monitor, provide supportive care. Daughter encouraged to call as needs/concerns arise.

## 2021-10-11 ENCOUNTER — Ambulatory Visit (INDEPENDENT_AMBULATORY_CARE_PROVIDER_SITE_OTHER): Payer: Medicare Other

## 2021-10-11 DIAGNOSIS — I441 Atrioventricular block, second degree: Secondary | ICD-10-CM

## 2021-10-12 LAB — CUP PACEART REMOTE DEVICE CHECK
Battery Remaining Longevity: 119 mo
Battery Voltage: 3.01 V
Brady Statistic AP VP Percent: 15.09 %
Brady Statistic AP VS Percent: 0 %
Brady Statistic AS VP Percent: 84.39 %
Brady Statistic AS VS Percent: 0.51 %
Brady Statistic RA Percent Paced: 14.98 %
Brady Statistic RV Percent Paced: 99.49 %
Date Time Interrogation Session: 20230825124803
Implantable Lead Implant Date: 20220525
Implantable Lead Implant Date: 20220525
Implantable Lead Location: 753859
Implantable Lead Location: 753860
Implantable Lead Model: 5076
Implantable Lead Model: 5076
Implantable Pulse Generator Implant Date: 20220525
Lead Channel Impedance Value: 323 Ohm
Lead Channel Impedance Value: 380 Ohm
Lead Channel Impedance Value: 418 Ohm
Lead Channel Impedance Value: 437 Ohm
Lead Channel Pacing Threshold Amplitude: 0.5 V
Lead Channel Pacing Threshold Amplitude: 0.625 V
Lead Channel Pacing Threshold Pulse Width: 0.4 ms
Lead Channel Pacing Threshold Pulse Width: 0.4 ms
Lead Channel Sensing Intrinsic Amplitude: 13.125 mV
Lead Channel Sensing Intrinsic Amplitude: 13.125 mV
Lead Channel Sensing Intrinsic Amplitude: 2 mV
Lead Channel Sensing Intrinsic Amplitude: 2 mV
Lead Channel Setting Pacing Amplitude: 1.5 V
Lead Channel Setting Pacing Amplitude: 2.5 V
Lead Channel Setting Pacing Pulse Width: 0.4 ms
Lead Channel Setting Sensing Sensitivity: 1.2 mV

## 2021-11-06 NOTE — Progress Notes (Signed)
Remote pacemaker transmission.   

## 2021-11-09 ENCOUNTER — Non-Acute Institutional Stay: Payer: Medicare Other | Admitting: Student

## 2021-11-09 DIAGNOSIS — Z515 Encounter for palliative care: Secondary | ICD-10-CM

## 2021-11-09 DIAGNOSIS — F0280 Dementia in other diseases classified elsewhere without behavioral disturbance: Secondary | ICD-10-CM

## 2021-11-09 DIAGNOSIS — R600 Localized edema: Secondary | ICD-10-CM

## 2021-11-09 NOTE — Progress Notes (Signed)
[place] Consult Note Telephone: [phone]  Fax: [phone]    Date of encounter: 11/09/21 4:50 PM PATIENT NAME: [place] [id number]   [phone] (home)  DOB: 1933/04/10 MRN: [id number] PRIMARY CARE PROVIDER:    Cathie Beams  REFERRING PROVIDER:   Cathie Beams  RESPONSIBLE PARTY:    Contact Information     Name Relation Home Work Hubbard Daughter [phone]  [phone]   Nora, Rooke [phone]     Bertis Ruddy [phone]          I met face to face with patient in the facility. Palliative Care was asked to follow this patient by consultation request of  [facility] to address advance care planning and complex medical decision making. This is a follow up visit.                                   ASSESSMENT AND PLAN / RECOMMENDATIONS:   Advance Care Planning/Goals of Care: Goals include to maximize quality of life and symptom management. Patient/health care surrogate gave his/her permission to discuss. CODE STATUS: DNR  Education provided on palliative medicine.  Will continue to provide supportive care, monitor for changes and declines, symptom management as needed.  Symptom Management/Plan:  Alzheimer's dementia-patient requires assistance with adl's. Staff to reorient and redirect as needed. Patient continues to sleep throughout the day. He requires encouragement to go to dining room for meals. Continue memantine, Risperdal, mirtazapine as directed. Patient continues to be followed by psychiatry.  LE edema-edema has been managed. Continue hctz as directed. Elevate legs when in bed or chair.   Follow up Palliative Care Visit: Palliative care will continue to follow for complex medical decision making, advance care planning, and clarification of goals. Return in 8 weeks or prn.   This visit was coded based on  medical decision making (MDM).  PPS: 40%  HOSPICE ELIGIBILITY/DIAGNOSIS: TBD  Chief Complaint: Palliative Medicine follow up visit.   HISTORY OF PRESENT ILLNESS:  Timothy Ramirez is a 88 y.o. year old male  with Alzheimer's dementia, CVA, aortic stenosis, complete heart block, status post pacemaker May 2022, CAD, hypertension, hyperlipidemia, depression, GERD, insomnia.     Patient resides at [facility] and dementia. Patient ambulating about unit. He asks to be checked out. He denies any specific complaints. He denies pain; endorses shortness of breath with exertion. Denies nausea, constipation. He continues to sleep throughout the day. He goes to dining room for meals. He uses walker for ambulation. Staff do not report any worsening sexual behaviors. Patient with dried blood to left ear; he states he may have scratched ear.  Staff also contributes to HPI due to his dementia.  History obtained from review of EMR, discussion with primary team, and interview with family, facility staff/caregiver and/or Timothy Ramirez.  I reviewed available labs, medications, imaging, studies and related documents from the EMR.  Records reviewed and summarized above.    Physical Exam: Pulse 82, resp 20, b/p 154, 92, sats 94% on room air Constitutional: NAD General: frail appearing EYES: anicteric sclera, lids intact, no discharge  ENMT: intact hearing, oral mucous membranes moist CV: S1S2, RRR, no LE edema Pulmonary: LCTA, no increased work of breathing, no cough, room air Abdomen: normo-active BS + 4 quadrants, soft and non tender GU: deferred MSK: moves all extremities, ambulatory  with walker Skin: warm and dry, no rashes or wounds on visible skin Neuro: generalized weakness, A & O to person, familiars Psych: non-anxious affect, pleasant, cooperative Hem/lymph/immuno: no widespread bruising   Thank you for the opportunity to participate in the care of Timothy Ramirez.  The palliative care  team will continue to follow. Please call our office at [phone] if we can be of additional assistance.   Ezekiel Slocumb, NP   COVID-19 PATIENT SCREENING TOOL Asked and negative response unless otherwise noted:   Have you had symptoms of covid, tested positive or been in contact with someone with symptoms/positive test in the past 5-10 days? No

## 2022-01-04 ENCOUNTER — Emergency Department
Admission: EM | Admit: 2022-01-04 | Discharge: 2022-01-05 | Disposition: A | Discharge status: Home / Self Care | Payer: Medicare Other | Attending: Emergency Medicine | Admitting: Emergency Medicine

## 2022-01-04 ENCOUNTER — Emergency Department: Payer: Medicare Other

## 2022-01-04 ENCOUNTER — Other Ambulatory Visit: Payer: Self-pay

## 2022-01-04 DIAGNOSIS — R0602 Shortness of breath: Secondary | ICD-10-CM | POA: Diagnosis present

## 2022-01-04 DIAGNOSIS — Z1152 Encounter for screening for COVID-19: Secondary | ICD-10-CM | POA: Diagnosis not present

## 2022-01-04 DIAGNOSIS — E876 Hypokalemia: Secondary | ICD-10-CM | POA: Diagnosis not present

## 2022-01-04 DIAGNOSIS — E8770 Fluid overload, unspecified: Secondary | ICD-10-CM

## 2022-01-04 LAB — CBC WITH DIFFERENTIAL/PLATELET
Abs Immature Granulocytes: 0.02 10*3/uL (ref 0.00–0.07)
Basophils Absolute: 0 10*3/uL (ref 0.0–0.1)
Basophils Relative: 1 %
Eosinophils Absolute: 0.1 10*3/uL (ref 0.0–0.5)
Eosinophils Relative: 2 %
HCT: 43.9 % (ref 39.0–52.0)
Hemoglobin: 15.2 g/dL (ref 13.0–17.0)
Immature Granulocytes: 0 %
Lymphocytes Relative: 30 %
Lymphs Abs: 1.9 10*3/uL (ref 0.7–4.0)
MCH: 30 pg (ref 26.0–34.0)
MCHC: 34.6 g/dL (ref 30.0–36.0)
MCV: 86.8 fL (ref 80.0–100.0)
Monocytes Absolute: 0.7 10*3/uL (ref 0.1–1.0)
Monocytes Relative: 11 %
Neutro Abs: 3.5 10*3/uL (ref 1.7–7.7)
Neutrophils Relative %: 56 %
Platelets: 151 10*3/uL (ref 150–400)
RBC: 5.06 MIL/uL (ref 4.22–5.81)
RDW: 13.2 % (ref 11.5–15.5)
WBC: 6.2 10*3/uL (ref 4.0–10.5)
nRBC: 0 % (ref 0.0–0.2)

## 2022-01-04 LAB — RESP PANEL BY RT-PCR (FLU A&B, COVID) ARPGX2
Influenza A by PCR: NEGATIVE
Influenza B by PCR: NEGATIVE
SARS Coronavirus 2 by RT PCR: NEGATIVE

## 2022-01-04 LAB — BRAIN NATRIURETIC PEPTIDE: B Natriuretic Peptide: 381.1 pg/mL — ABNORMAL HIGH (ref 0.0–100.0)

## 2022-01-04 LAB — BASIC METABOLIC PANEL
Anion gap: 8 (ref 5–15)
BUN: 18 mg/dL (ref 8–23)
CO2: 22 mmol/L (ref 22–32)
Calcium: 8.6 mg/dL — ABNORMAL LOW (ref 8.9–10.3)
Chloride: 109 mmol/L (ref 98–111)
Creatinine, Ser: 1.38 mg/dL — ABNORMAL HIGH (ref 0.61–1.24)
GFR, Estimated: 50 mL/min — ABNORMAL LOW (ref >60)
Glucose, Bld: 138 mg/dL — ABNORMAL HIGH (ref 70–99)
Potassium: 3.7 mmol/L (ref 3.5–5.1)
Sodium: 139 mmol/L (ref 135–145)

## 2022-01-04 LAB — PROCALCITONIN: Procalcitonin: <0.1 ng/mL

## 2022-01-04 LAB — TROPONIN I (HIGH SENSITIVITY)
Troponin I (High Sensitivity): 24 ng/L — ABNORMAL HIGH (ref <18)
Troponin I (High Sensitivity): 28 ng/L — ABNORMAL HIGH (ref <18)

## 2022-01-04 MED ORDER — FUROSEMIDE 20 MG PO TABS: 20.0000 mg | ORAL_TABLET | Freq: Every day | ORAL | 0 refills | Status: DC

## 2022-01-04 MED ORDER — FUROSEMIDE 10 MG/ML IJ SOLN
40.0000 mg | Freq: Once | INTRAMUSCULAR | Status: AC
Start: 2022-01-04 — End: 2022-01-04
  Administered 2022-01-04: 40 mg via INTRAVENOUS
  Filled 2022-01-04: qty 4

## 2022-01-04 MED ORDER — IOHEXOL 350 MG/ML SOLN
75.0000 mL | Freq: Once | INTRAVENOUS | Status: AC | PRN
  Administered 2022-01-04: 75 mL via INTRAVENOUS

## 2022-01-04 NOTE — Discharge Instructions (Signed)
Please seek medical attention for any high fevers, chest pain, shortness of breath, change in behavior, persistent vomiting, bloody stool or any other new or concerning symptoms.  

## 2022-01-04 NOTE — ED Provider Notes (Signed)
[facility] Provider Note    Event Date/Time   First MD Initiated Contact with Patient 01/04/22 1607     (approximate)   History   Shortness of Breath   HPI  Timothy Ramirez is a 88 y.o. male who presents to the emergency department today from living facility because of concern for shortness of breath. History is obtained from family at bedside.  They state they had just finished lunch with the patient.  Upon get back to his room he started becoming short of breath.  They saw him breathing with his stomach.  He was also complaining of pain primarily on the left side of his body although had some right-sided pain as well.  Family denies any history of lung disease.  States otherwise he had a normal morning without any unusual exertion or activity.  He did not report any gagging or choking episodes during lunch.  Physical Exam   Triage Vital Signs: ED Triage Vitals [01/04/22 1546]  Enc Vitals Group     BP (!) 130/94     Pulse Rate 85     Resp 18     Temp      Temp src      SpO2 96 %     Weight      Height      Head Circumference      Peak Flow      Pain Score      Pain Loc      Pain Edu?      Excl. in [place]?     Most recent vital signs: Vitals:   01/04/22 1546  BP: (!) 130/94  Pulse: 85  Resp: 18  SpO2: 96%   General: Awake, no distress. Not oriented to events. CV:  Good peripheral perfusion. Regular rate and rhythm. Resp:  Slightly increased work of breathing.  Abd:  No distention.  Other:  No lower extremity edema.   ED Results / Procedures / Treatments   Labs (all labs ordered are listed, but only abnormal results are displayed) Labs Reviewed  BASIC METABOLIC PANEL - Abnormal; Notable for the following components:      Result Value   Glucose, Bld 138 (*)    Creatinine, Ser 1.38 (*)    Calcium 8.6 (*)    GFR, Estimated 50 (*)    All other components within normal limits  BRAIN NATRIURETIC PEPTIDE - Abnormal; Notable for the  following components:   B Natriuretic Peptide 381.1 (*)    All other components within normal limits  TROPONIN I (HIGH SENSITIVITY) - Abnormal; Notable for the following components:   Troponin I (High Sensitivity) 24 (*)    All other components within normal limits  TROPONIN I (HIGH SENSITIVITY) - Abnormal; Notable for the following components:   Troponin I (High Sensitivity) 28 (*)    All other components within normal limits  RESP PANEL BY RT-PCR (FLU A&B, COVID) ARPGX2  CBC WITH DIFFERENTIAL/PLATELET  PROCALCITONIN     EKG  I, Phineas Semen, attending physician, personally viewed and interpreted this EKG  EKG Time: 1543 Rate: 82 Rhythm: sinus rhythm Axis: left axis deviation Intervals: qtc 533 QRS: IVCD ST changes: no st elevation Impression: abnormal ekg   RADIOLOGY I independently interpreted and visualized the CXR. My interpretation: No pneumonia. Radiology interpretation: IMPRESSION:  Cardiomegaly with vascular congestion but no definite pulmonary  interstitial edema.     PROCEDURES:  Critical Care performed: No  Procedures   MEDICATIONS ORDERED  IN ED: Medications - No data to display   IMPRESSION / MDM / ASSESSMENT AND PLAN / ED COURSE  I reviewed the triage vital signs and the nursing notes.                              Differential diagnosis includes, but is not limited to, pneumonia, PE, edema, ACS.  Patient's presentation is most consistent with acute presentation with potential threat to life or bodily function.  Patient presented to the emergency department today because of concerns for shortness of breath.  Patient is not hypoxic nor tachypneic here in the emergency department.  Lungs were clear.  Chest x-ray without any obvious abnormality.  Did obtain a CT scan.  This was negative for PE.  Did raise a question of edema versus possible infection.  Procalcitonin was negative BNP was slightly elevated.  Patient was given IV Lasix here in the  emergency department and started to have increased urinary output.  I did have a discussion with family.  Did discuss admission versus discharge.  At this time they feel comfortable with discharge and I think this is reasonable.  Additionally family had noticed patient doing intermittent jerking motions.  I did observe some in the room.  Primarily his upper arms.  It was not seizure-like.  Unclear etiology I did encourage them to follow-up on this. FINAL CLINICAL IMPRESSION(S) / ED DIAGNOSES   Final diagnoses:  SOB (shortness of breath)  Hypervolemia, unspecified hypervolemia type     Note:  This document was prepared using Dragon voice recognition software and may include unintentional dictation errors.    Phineas Semen, MD 01/05/22 [phone]

## 2022-01-04 NOTE — ED Triage Notes (Signed)
Pt came via [facility] from [facility]. EMS was called for respiratory distress and staff told EMS they gave "4 asprin to the pt" EMS claims all vitals were stable 128/82, HR 77, 97% SPO2, CBG of 141. EMS also states have hx of dementia, has a DNR and a pacemaker. Pt is claiming "head hurts and everything feels messed up".

## 2022-01-14 ENCOUNTER — Ambulatory Visit (INDEPENDENT_AMBULATORY_CARE_PROVIDER_SITE_OTHER): Payer: Medicare Other

## 2022-01-14 DIAGNOSIS — I441 Atrioventricular block, second degree: Secondary | ICD-10-CM

## 2022-01-14 LAB — CUP PACEART REMOTE DEVICE CHECK
Battery Remaining Longevity: 115 mo
Battery Voltage: 3.01 V
Brady Statistic AP VP Percent: 2.39 %
Brady Statistic AP VS Percent: 0 %
Brady Statistic AS VP Percent: 97.26 %
Brady Statistic AS VS Percent: 0.34 %
Brady Statistic RA Percent Paced: 2.45 %
Brady Statistic RV Percent Paced: 99.66 %
Date Time Interrogation Session: 20231127063307
Implantable Lead Connection Status: 753985
Implantable Lead Connection Status: 753985
Implantable Lead Implant Date: 20220525
Implantable Lead Implant Date: 20220525
Implantable Lead Location: 753859
Implantable Lead Location: 753860
Implantable Lead Model: 5076
Implantable Lead Model: 5076
Implantable Pulse Generator Implant Date: 20220525
Lead Channel Impedance Value: 304 Ohm
Lead Channel Impedance Value: 361 Ohm
Lead Channel Impedance Value: 380 Ohm
Lead Channel Impedance Value: 437 Ohm
Lead Channel Pacing Threshold Amplitude: 0.625 V
Lead Channel Pacing Threshold Amplitude: 0.625 V
Lead Channel Pacing Threshold Pulse Width: 0.4 ms
Lead Channel Pacing Threshold Pulse Width: 0.4 ms
Lead Channel Sensing Intrinsic Amplitude: 1.5 mV
Lead Channel Sensing Intrinsic Amplitude: 1.5 mV
Lead Channel Sensing Intrinsic Amplitude: 13.625 mV
Lead Channel Sensing Intrinsic Amplitude: 13.625 mV
Lead Channel Setting Pacing Amplitude: 1.5 V
Lead Channel Setting Pacing Amplitude: 2.5 V
Lead Channel Setting Pacing Pulse Width: 0.4 ms
Lead Channel Setting Sensing Sensitivity: 1.2 mV
Zone Setting Status: 755011
Zone Setting Status: 755011

## 2022-01-30 ENCOUNTER — Non-Acute Institutional Stay: Payer: Medicare Other | Admitting: Hospice

## 2022-01-30 DIAGNOSIS — F03918 Unspecified dementia, unspecified severity, with other behavioral disturbance: Secondary | ICD-10-CM

## 2022-01-30 DIAGNOSIS — I5022 Chronic systolic (congestive) heart failure: Secondary | ICD-10-CM

## 2022-01-30 DIAGNOSIS — R1310 Dysphagia, unspecified: Secondary | ICD-10-CM

## 2022-01-30 DIAGNOSIS — Z515 Encounter for palliative care: Secondary | ICD-10-CM

## 2022-01-30 DIAGNOSIS — F03911 Unspecified dementia, unspecified severity, with agitation: Secondary | ICD-10-CM

## 2022-01-30 NOTE — Progress Notes (Signed)
    [place] Consult Note Telephone: [phone]  Fax: [phone]    Date of encounter: 01/30/22 11:51 AM PATIENT NAME: Timothy Ramirez [id number]   [phone] (home)  DOB: Mar 13, 1933 MRN: [id number] PRIMARY CARE PROVIDER:    Cathie Beams  REFERRING PROVIDER:   Cathie Beams  RESPONSIBLE PARTY:    Contact Information     Name Relation Home Work [place] Daughter [phone]  [phone]   Jhony, [place] [phone]     Bertis Ruddy [phone]          I met face to face with patient in the facility. Palliative Care was asked to follow this patient by consultation request of  [facility] to address advance care planning and complex medical decision making. This is a follow up visit. Timothy Ramirez was with patient visit during visit.    ASSESSMENT AND PLAN / RECOMMENDATIONS:   Advance Care Planning/Goals of Care: Goals include to maximize quality of life and symptom management. Patient/health care surrogate gave his/her permission to discuss. CODE STATUS: DNR  Symptom Management/Plan:  Alzheimer's dementia-p memory loss/confusion, impoverished thoughts, incontinent of bowel and bladder, occasional agitation, FAST 6D continue memantine; continue ongoing supportive care. Agitation/mood disorder: Managed with Risperdal.  Follow-up with psych as planned/needed. CHF: Continue lasix. Weigh weekly. No added salt.  Reports weight gain of 2 pounds in a day or 5 pounds in a week.  Monitor for edema, shortness of breath. Dysphagia: ST following. Currently in mechanical soft diet. mirtazapine as directed. Patient continues to be followed by psychiatry.  Follow up Palliative Care Visit: Palliative care will continue to follow for complex medical decision making, advance care planning, and clarification of goals. Return in 8 weeks or prn.  HOSPICE  ELIGIBILITY/DIAGNOSIS: TBD  Chief Complaint: Palliative Medicine follow up visit.   HISTORY OF PRESENT ILLNESS:  Timothy Ramirez is a 88 y.o. year old male  with multiple morbidities requiring close monitoring, with high risk for complications and mortality: Alzheimer's dementia, dysphagia, agitation, CHF.  History of CVA, aortic stenosis, complete heart block, status post pacemaker May 2022, CAD, hypertension, hyperlipidemia, depression, GERD, insomnia.    History obtained from review of EMR, discussion with primary team, and interview with family, facility staff/caregiver and/or Timothy Ramirez.  I reviewed available labs, medications, imaging, studies and related documents from the EMR.  Records reviewed and summarized above.     I spent 45 minutes providing this consultation; this includes time spent with patient/family, chart review and documentation. More than 50% of the time in this consultation was spent on counseling and coordinating communication. Thank you for the opportunity to participate in the care of Timothy Ramirez.  The palliative care team will continue to follow. Please call our office at [phone] if we can be of additional assistance.   Teodoro Spray, NP

## 2022-02-22 NOTE — Progress Notes (Signed)
Remote pacemaker transmission.   

## 2022-02-27 ENCOUNTER — Non-Acute Institutional Stay: Payer: Medicare Other | Admitting: Hospice

## 2022-02-27 DIAGNOSIS — F03918 Unspecified dementia, unspecified severity, with other behavioral disturbance: Secondary | ICD-10-CM

## 2022-02-27 DIAGNOSIS — I5022 Chronic systolic (congestive) heart failure: Secondary | ICD-10-CM

## 2022-02-27 DIAGNOSIS — R1310 Dysphagia, unspecified: Secondary | ICD-10-CM

## 2022-02-27 DIAGNOSIS — F03911 Unspecified dementia, unspecified severity, with agitation: Secondary | ICD-10-CM

## 2022-02-27 DIAGNOSIS — Z515 Encounter for palliative care: Secondary | ICD-10-CM

## 2022-02-27 NOTE — Progress Notes (Signed)
    [facility] Consult Note Telephone: [phone]  Fax: [phone]    Date of encounter: 02/27/22 9:43 AM PATIENT NAME: Timothy Ramirez [id number]   [phone] (home)  DOB: 06-01-1933 MRN: [id number] PRIMARY CARE PROVIDER:    Cathie Beams  REFERRING PROVIDER:   Cathie Beams  RESPONSIBLE PARTY:    Contact Information     Name Relation Home Work Dorothy Daughter [phone]  [phone]   Phillippe, [place] [phone]     Bertis Ruddy [phone]        I met face to face with patient in the facility. Palliative Care was asked to follow this patient by consultation request of  [facility] to address advance care planning and complex medical decision making. This is a follow up visit. NP called [name] and updated her on visit.  [name] informed NP that patient may be moving to another facility in the near future.   ASSESSMENT AND PLAN / RECOMMENDATIONS:   Advance Care Planning/Goals of Care: Goals include to maximize quality of life and symptom management.  CODE STATUS: DNR  Symptom Management/Plan:  Alzheimer's dementia-progressive memory loss/confusion, impoverished thoughts, incontinent of bowel and bladder, occasional agitation, FAST 6D. Continue memantine; continue ongoing supportive care.  Agitation/mood disorder: Continue Risperdal.  Follow-up with psych as planned/needed. CHF: Continue lasix. Weight weekly. No added salt.  Reports weight gain of 2 pounds in a day or 5 pounds in a week.  Monitor for edema, shortness of breath.  Recent weight 02/25/22 193.8Ibs a decline from 198.2 Ibs in Nov 2024  Dysphagia: Continue aspiration precautions. ST following. Currently on mechanical soft diet. Continue mirtazapine as directed. Patient continues to be followed by psychiatry.  Follow up Palliative Care Visit: Palliative care will continue to  follow for complex medical decision making, advance care planning, and clarification of goals. Return in 8 weeks or prn.  HOSPICE ELIGIBILITY/DIAGNOSIS: TBD  Chief Complaint: Palliative Medicine follow up visit.   HISTORY OF PRESENT ILLNESS:  Timothy Ramirez is a 89 y.o. year old male  with multiple morbidities requiring close monitoring, with high risk for complications and mortality: Alzheimer's dementia, dysphagia, agitation, CHF.  History of CVA, aortic stenosis, complete heart block, status post pacemaker May 2022, CAD, hypertension, hyperlipidemia, depression, GERD, insomnia.    History obtained from review of EMR, discussion with primary team, and interview with family, facility staff/caregiver and/or Timothy Ramirez. Rest of 10-point ROS asked and negative.  I reviewed available labs, medications, imaging, studies and related documents from the EMR.  Records reviewed and summarized above.   I spent 45 minutes providing this consultation; this includes time spent with patient/family, chart review and documentation. More than 50% of the time in this consultation was spent on counseling and coordinating communication.  Thank you for the opportunity to participate in the care of Timothy Ramirez.  The palliative care team will continue to follow. Please call our office at [phone] if we can be of additional assistance.   Teodoro Spray, NP

## 2022-03-16 ENCOUNTER — Emergency Department
Admission: EM | Admit: 2022-03-16 | Discharge: 2022-03-16 | Disposition: A | Discharge status: Home / Self Care | Payer: Medicare Other | Attending: Emergency Medicine | Admitting: Emergency Medicine

## 2022-03-16 DIAGNOSIS — I2510 Atherosclerotic heart disease of native coronary artery without angina pectoris: Secondary | ICD-10-CM | POA: Insufficient documentation

## 2022-03-16 DIAGNOSIS — H9191 Unspecified hearing loss, right ear: Secondary | ICD-10-CM | POA: Insufficient documentation

## 2022-03-16 DIAGNOSIS — R609 Edema, unspecified: Secondary | ICD-10-CM | POA: Diagnosis present

## 2022-03-16 DIAGNOSIS — I10 Essential (primary) hypertension: Secondary | ICD-10-CM | POA: Insufficient documentation

## 2022-03-16 DIAGNOSIS — Z950 Presence of cardiac pacemaker: Secondary | ICD-10-CM | POA: Diagnosis not present

## 2022-03-16 MED ORDER — FUROSEMIDE 40 MG PO TABS
20.0000 mg | ORAL_TABLET | Freq: Once | ORAL | Status: AC
Start: 2022-03-16 — End: 2022-03-16
  Administered 2022-03-16: 20 mg via ORAL
  Filled 2022-03-16: qty 1

## 2022-03-16 MED ORDER — FUROSEMIDE 20 MG PO TABS: 20.0000 mg | ORAL_TABLET | Freq: Every day | ORAL | 11 refills | Status: DC

## 2022-03-16 NOTE — ED Notes (Signed)
Patient tolerated PO medication well 

## 2022-03-16 NOTE — ED Provider Notes (Signed)
[facility] Provider Note    Event Date/Time   First MD Initiated Contact with Patient 03/16/22 1445     (approximate)   History   Joint Swelling (Palliative care patient brought in by daughter for concerns of fluid retention related to the fact that patient has not been receiving his daily lasix at his new care facility; Patient is suppose to be taking medication daily but the orders for the medication were written as "daily as needed"; Edema is noted to patient's fingers (Patient has Alzheimer's so history is obtained from his daughter))   HPI  Timothy Ramirez is a 89 y.o. male past medical history of aortic stenosis, coronary disease, complete heart block status post pacemaker, hypertension hyperlipidemia presents with concern for edema.  Patient is accompanied by his daughter who provides all the history.  She moved him from [facility] to [facility] about 2 weeks ago.  Today when she went to visit she noticed that the bilateral hands looked swollen.  She asked the facility whether he has been receiving his Lasix but they said that it had been written for as needed and they [place] it so he has not gotten any Lasix for the last 2 weeks.  She noticed that he did look somewhat short of breath earlier but did not have any complaints and is not having shortness of breath now.  His only complaint was that he could not hear out of his right ear and that it was clogged up.  Patient's daughter tells me he is on palliative care has not hospice yet but she would like to keep him comfortable and avoid admissions.  She denies any change in mental status.     Past Medical History:  Diagnosis Date   Aortic stenosis    Moderate (mean gradient 37, AVA 1.1 by cath in 01/2020)   Borderline diabetes    Complete heart block ([place]) 06/2020   s/p [employer] PPM 07/12/2020   Coronary artery disease 01/2020   Mild, nonobstructive dz by cath   Hyperlipidemia    Hypertension     Patient  Active Problem List   Diagnosis Date Noted   Stenosis of left vertebral artery    Acute stroke due to ischemia [facility])    Depression    Memory loss    Gastroenteritis 09/10/2020   Weakness 09/09/2020   Nausea & vomiting 09/09/2020   Complete heart block ([place]) 07/12/2020   DNR (do not resuscitate) 07/12/2020   Heart block AV third degree ([place]) 07/11/2020   Dementia without behavioral disturbance ([place])    Heart block 05/05/2020   CAD in native artery 05/05/2020   AV block, 2nd degree 12/24/2019   Bradycardia 11/08/2019   Constipation 11/08/2019   Elevated troponin 11/08/2019   Diabetes mellitus type 2, uncomplicated ([place]) 26/71/2458   Microhematuria 08/04/2019   BPH without obstruction/lower urinary tract symptoms 08/04/2019   Atypical chest pain 05/20/2018   Shortness of breath 05/20/2018   Severe aortic stenosis 05/20/2018   Nonrheumatic mitral valve regurgitation 05/20/2018   Essential hypertension 05/20/2018   Mixed hyperlipidemia 05/20/2018     Physical Exam  Triage Vital Signs: ED Triage Vitals [03/16/22 1433]  Enc Vitals Group     BP 123/71     Pulse Rate 94     Resp 19     Temp (!) 97.5 F (36.4 C)     Temp Source Oral     SpO2 97 %     Weight  Height      Head Circumference      Peak Flow      Pain Score      Pain Loc      Pain Edu?      Excl. in [place]?     Most recent vital signs: Vitals:   03/16/22 1433  BP: 123/71  Pulse: 94  Resp: 19  Temp: (!) 97.5 F (36.4 C)  SpO2: 97%     General: Awake, no distress.  CV:  Good peripheral perfusion.  Resp:  Normal effort.  Lungs are clear no crackles, creased work of breathing Abd:  No distention.  Neuro:             Awake, Alert, Oriented x 2 Other:  Left external canal with cerumen able to visualize TM which looks clear, right TM clear no significant cerumen 1+ edema bilateral lower extremities  ED Results / Procedures / Treatments  Labs (all labs ordered are listed, but only abnormal results  are displayed) Labs Reviewed - No data to display   EKG     RADIOLOGY    PROCEDURES:  Critical Care performed: No  Procedures  MEDICATIONS ORDERED IN ED: Medications  furosemide (LASIX) tablet 20 mg (has no administration in time range)     IMPRESSION / MDM / ASSESSMENT AND PLAN / ED COURSE  I reviewed the triage vital signs and the nursing notes.                              Patient is an 89 year old male who is currently undergoing palliative care who presents due to concern for edema and right hearing loss.  Patient's daughter provides all the history as she is concerned that his hands appear edematous and he has not been receiving his Lasix over the last 2 weeks.  She thought he looked somewhat short of breath earlier but has no shortness of breath currently.  His only complaint was that he could not hear out of the right ear.  Patient's vitals are reassuring on exam he is sleepy she says that this is his baseline does not talk much is quite hard of hearing.  He does not have any increased work of breathing lungs sound clear does have pitting edema in the lower extremities some edema of the bilateral hands.  Right external canal and TM look normal and there is some cerumen on the left that was the right knee that was bothering him earlier.  Denying complaints of hearing loss currently.  I had discussion with his daughter about whether she would like to check labs or chest x-ray.  Ultimately she wanted to keep him comfortable and wanted him to get started back on the Lasix.  She is she does not want to pursue admission or dialysis for his labs to indicate that he needed it.  Less I do not feel it will be useful to check labs today.  Will start him on 20 of Lasix.  I looked back on his vascular risk for palliative care as he supposed to be on Lasix but there is not a dose listed.  I only see that he had been prescribed Lasix from the ED for several days at 20 mg.  I also see that  patient has history of aortic stenosis this do not want to drop his preload significantly.  Will start on small dose of Lasix 20 mg daily.  Will have him  follow-up with palliative care.  Otherwise I think he is appropriate to be discharged back to his facility.       FINAL CLINICAL IMPRESSION(S) / ED DIAGNOSES   Final diagnoses:  Edema, unspecified type     Rx / DC Orders   ED Discharge Orders          Ordered    furosemide (LASIX) 20 MG tablet  Daily        03/16/22 1558             Note:  This document was prepared using Dragon voice recognition software and may include unintentional dictation errors.   Georga Hacking, MD 03/16/22 [phone]

## 2022-03-16 NOTE — Discharge Instructions (Addendum)
Start back on the Lasix 20 mg daily.  Please follow-up with palliative care for further recommendations.

## 2022-03-16 NOTE — ED Triage Notes (Signed)
Palliative care patient brought in by daughter for concerns of fluid retention related to the fact that patient has not been receiving his daily lasix at his new care facility; Patient is suppose to be taking medication daily but the orders for the medication were written as "daily as needed"; Edema is noted to patient's fingers (Patient has Alzheimer's so history is obtained from his daughter) 

## 2022-03-16 NOTE — ED Notes (Signed)
Patient assisted into wheelchair for discharge from ED. Patient also assisted from wheelchair into car with daughter driving. Discharge instructions explained to patient and family at this time. Patient and family state they understand and agree.

## 2022-03-16 NOTE — ED Triage Notes (Signed)
Palliative care patient brought in by daughter for concerns of fluid retention related to the fact that patient has not been receiving his daily lasix at his new care facility; Patient is suppose to be taking medication daily but the orders for the medication were written as "daily as needed"; Edema is noted to patient's fingers (Patient has Alzheimer's so history is obtained from his daughter)

## 2022-03-28 NOTE — Progress Notes (Deleted)
Cardiology Office Note    Date:  03/28/2022   ID:  Timothy Ramirez, DOB 1933-09-25, MRN [id number]  PCP:  Maryland Pink, MD  Cardiologist:  Nelva Bush, MD  Electrophysiologist:  [name], MD   Chief Complaint: ***  History of Present Illness:   Timothy Ramirez is a 89 y.o. male with history of ***  ***   Labs independently reviewed: 12/2021 - BNP 381, potassium 3.7, BUN 18, serum creatinine 1.38, Hgb 15.2, PLT 151 08/2020 - albumin 3.2, AST/ALT normal, TC 94, TG 92, HDL 29, LDL 47, TSH normal, A1c 6.4  Past Medical History:  Diagnosis Date   Aortic stenosis    Moderate (mean gradient 37, AVA 1.1 by cath in 01/2020)   Borderline diabetes    Complete heart block ([place]) 06/2020   s/p Medtr[employer] PPM 07/12/2020   Coronary artery disease 01/2020   Mild, nonobstructive dz by cath   Hyperlipidemia    Hypertension     Past Surgical History:  Procedure Laterality Date   APPENDECTOMY     HERNIA REPAIR     PACEMAKER IMPLANT N/A 07/12/2020   Procedure: PACEMAKER IMPLANT;  Surgeon: Vickie Epley, MD;  Location: [facility];  Service: Cardiovascular;  Laterality: N/A;   RIGHT/LEFT HEART CATH AND CORONARY ANGIOGRAPHY N/A 02/03/2020   Procedure: RIGHT/LEFT HEART CATH AND CORONARY ANGIOGRAPHY;  Surgeon: Nelva Bush, MD;  Location: [facility];  Service: Cardiovascular;  Laterality: N/A;   TONSILLECTOMY      Current Medications: No outpatient medications have been marked as taking for the 04/01/22 encounter (Appointment) with Rise Mu, PA-C.    Allergies:   Patient has no known allergies.   Social History   Socioeconomic History   Marital status: Widowed    Spouse name: Not on file   Number of children: Not on file   Years of education: Not on file   Highest education level: Not on file  Occupational History   Not on file  Tobacco Use   Smoking status: Never   Smokeless tobacco: Never  Vaping Use   Vaping Use: Never used  Substance  and Sexual Activity   Alcohol use: Not Currently    Alcohol/week: 2.0 standard drinks of alcohol    Types: 1 Cans of beer, 1 Shots of liquor per week    Comment: last drink was Saturday, 04/22/2020.   Drug use: No   Sexual activity: Not Currently  Other Topics Concern   Not on file  Social History Narrative   Not on file   Social Determinants of Health   Financial Resource Strain: Not on file  Food Insecurity: Not on file  Transportation Needs: Not on file  Physical Activity: Not on file  Stress: Not on file  Social Connections: Not on file     Family History:  The patient's family history includes COPD in his sister; Cirrhosis in his father; Congestive Heart Failure in his mother; Heart attack (age of onset: 24) in his mother.  ROS:   12-point review of systems is negative unless otherwise noted in HPI.   EKGs/Labs/Other Studies Reviewed:    Studies reviewed were summarized above. The additional studies were reviewed today:  2D echo 09/12/2020: 1. Left ventricular ejection fraction, by estimation, is 55 to 60%. The  left ventricle has normal function. The left ventricle has no regional  wall motion abnormalities. There is mild left ventricular hypertrophy.  Left ventricular diastolic parameters  are consistent with Grade I  diastolic dysfunction (impaired relaxation).   2. Right ventricular systolic function is normal. The right ventricular  size is normal. Mildly increased right ventricular wall thickness.   3. Left atrial size was mildly dilated.   4. The mitral valve is degenerative. Mild to moderate mitral valve  regurgitation. No evidence of mitral stenosis. Moderate mitral annular  calcification.   5. The aortic valve has an indeterminant number of cusps. There is severe  calcifcation of the aortic valve. There is severe thickening of the aortic  valve. Aortic valve regurgitation is trivial. Moderate to severe aortic  valve stenosis. Aortic valve  area, by VTI  measures 0.74 cm. Aortic valve mean gradient measures 38.0  mmHg.   Comparison(s): A prior study was performed on 11/08/2019. Aortic stenosis  has worsened and is now moderate to severe (mean gradient 31->38 mmHg, AVA  1.4->0.7 cm^2).  __________  [facility] 02/03/2020:   Conclusions: Mild, non-obstructive coronary artery disease with 20-30% proximal LAD, mid LAD, and mid LCx stenoses. Moderate, approaching severe, aortic valve stenosis (mean gradient 37 mmHg, valve area 1.1 cm^2). Normal left and right heart filling pressures. Normal cardiac output.   Recommendations: Readdress pacemaker placement with Dr. [name], as episodic shortness of breath and malaise are most concerning for arrhythmic etiology. Continue close clinical and echo follow-up of moderate aortic stenosis. Medical therapy and risk factor modification to prevent progression of coronary artery disease. __________  [facility] Reach patch 10/2019: The patient was monitored for 14 days. The predominant rhythm was sinus with an average rate of 68 bpm (range 56-118 bpm in sinus). There were rare PACs and PVCs. A single 4 beat run of nonsustained ventricular tachycardia occurred. Multiple episodes of second-degree AV block occurred. Some are consistent with Mobitz type I AV block. However, several episodes of 2:1 AV block and Mobitz type II second-degree AV block were observed. There were no patient triggered events.   Predominately sinus rhythm with rare PACs and PVCs as well as single episode of brief NSVT. Multiple episodes of second-degree AV block, including 2:1 AV block and Mobitz type II second-degree AV block recurred. __________  2D echo 11/08/2019: 1. Left ventricular ejection fraction, by estimation, is 60 to 65%. The  left ventricle has normal function. The left ventricle has no regional  wall motion abnormalities. There is moderate left ventricular hypertrophy.  Left ventricular diastolic  parameters are consistent with Grade I  diastolic dysfunction (impaired  relaxation).   2. Right ventricular systolic function is normal. The right ventricular  size is normal. Tricuspid regurgitation signal is inadequate for assessing  PA pressure.   3. Left atrial size was mildly dilated.   4. The mitral valve is normal in structure. Mild to moderate mitral valve  regurgitation. No evidence of mitral stenosis. Moderate mitral annular  calcification.   5. The aortic valve is abnormal. Aortic valve regurgitation is trivial.  Moderate aortic valve stenosis. Aortic valve area, by VTI measures 1.36  cm. Aortic valve mean gradient measures 28.3 mmHg.   6. Agitated saline contrast bubble study was negative, with no evidence  of any interatrial shunt.  __________  Treadmill MPI 04/13/2018: No T wave inversion was noted during stress. The study is normal. This is a low risk study. The left ventricular ejection fraction is normal (55-65%). The patient exercised for 4 minutes and 45 seconds and was limited by severe shortness of breath. Frequent PVCs noted in recovery. __________  2D echo 04/03/2018: 1. The cavity size was normal. Normal LV systolic function, EF >  55   2. There is mild to moderately increased left ventricular wall thickness.  Grade I diastolic dysfunction   3. The right ventricle has normal systolic function. The cavity was  normal. There is no increase in right ventricular wall thickness.   4. Left atrial size was mildly dilated.   5. Severe calcifcation of the aortic valve. Moderate stenosis of the  aortic valve.AV Mean Grad: 23.0 mmHg   6. Mitral valve regurgitation is mild to moderate by color flow Doppler.   7. Right atrial pressure is estimated at 10 mmHg.     EKG:  EKG is ordered today.  The EKG ordered today demonstrates ***  Recent Labs: 01/04/2022: B Natriuretic Peptide 381.1; BUN 18; Creatinine, Ser 1.38; Hemoglobin 15.2; Platelets 151; Potassium 3.7; Sodium 139  Recent Lipid Panel    Component  Value Date/Time   CHOL 94 09/12/2020 0522   TRIG 92 09/12/2020 0522   HDL 29 (L) 09/12/2020 0522   CHOLHDL 3.2 09/12/2020 0522   VLDL 18 09/12/2020 0522   LDLCALC 47 09/12/2020 0522    PHYSICAL EXAM:    VS:  There were no vitals taken for this visit.  BMI: There is no height or weight on file to calculate BMI.  Physical Exam  Wt Readings from Last 3 Encounters:  01/04/22 203 lb 14.4 oz (92.5 kg)  02/09/21 199 lb 1.2 oz (90.3 kg)  09/09/20 199 lb (90.3 kg)     ASSESSMENT & PLAN:   ***   {Are you ordering a CV Procedure (e.g. stress test, cath, DCCV, TEE, etc)?   Press F2        :[id number]}     Disposition: F/u with Dr. Saunders Revel or an APP in ***, and EP as directed.    Medication Adjustments/Labs and Tests Ordered: Current medicines are reviewed at length with the patient today.  Concerns regarding medicines are outlined above. Medication changes, Labs and Tests ordered today are summarized above and listed in the Patient Instructions accessible in Encounters.   Signed, Christell Faith, PA-C 03/28/2022 4:14 PM     [facility] [address] [place] [address] [place], [place] [address] [phone]

## 2022-04-01 ENCOUNTER — Ambulatory Visit: Payer: Medicare Other | Admitting: Physician Assistant

## 2022-04-02 ENCOUNTER — Encounter: Payer: Self-pay | Admitting: Physician Assistant

## 2022-04-02 ENCOUNTER — Ambulatory Visit: Payer: Medicare Other | Attending: Physician Assistant | Admitting: Physician Assistant

## 2022-04-02 VITALS — BP 96/64 | HR 74 | Ht 67.0 in | Wt 196.4 lb

## 2022-04-02 DIAGNOSIS — I5020 Unspecified systolic (congestive) heart failure: Secondary | ICD-10-CM | POA: Diagnosis present

## 2022-04-02 DIAGNOSIS — I442 Atrioventricular block, complete: Secondary | ICD-10-CM | POA: Diagnosis not present

## 2022-04-02 DIAGNOSIS — E782 Mixed hyperlipidemia: Secondary | ICD-10-CM | POA: Diagnosis present

## 2022-04-02 DIAGNOSIS — I2510 Atherosclerotic heart disease of native coronary artery without angina pectoris: Secondary | ICD-10-CM | POA: Diagnosis not present

## 2022-04-02 DIAGNOSIS — I350 Nonrheumatic aortic (valve) stenosis: Secondary | ICD-10-CM | POA: Diagnosis present

## 2022-04-02 DIAGNOSIS — I10 Essential (primary) hypertension: Secondary | ICD-10-CM

## 2022-04-02 DIAGNOSIS — I441 Atrioventricular block, second degree: Secondary | ICD-10-CM

## 2022-04-02 DIAGNOSIS — Z950 Presence of cardiac pacemaker: Secondary | ICD-10-CM

## 2022-04-02 DIAGNOSIS — Z8673 Personal history of transient ischemic attack (TIA), and cerebral infarction without residual deficits: Secondary | ICD-10-CM | POA: Diagnosis present

## 2022-04-02 MED ORDER — LISINOPRIL 2.5 MG PO TABS: 2.5000 mg | ORAL_TABLET | Freq: Every day | ORAL | 1 refills | Status: AC

## 2022-04-02 NOTE — Progress Notes (Signed)
Cardiology Office Note    Date:  04/02/2022   ID:  Timothy Ramirez, DOB 10/13/1933, MRN [id number]  PCP:  [name], NP  Cardiologist:  Nelva Bush, MD  Electrophysiologist:  [name], MD   Chief Complaint: Follow-up  History of Present Illness:   Timothy Ramirez is a 89 y.o. male with history of CAD, newly found cardiomyopathy, moderate to severe aortic stenosis, intermittent 2:1 AV block, complete heart block status post permanent pacemaker implantation in 06/2020, CVA in 08/2020, vascular dementia, HTN, and HLD who presents for follow-up of CAD, newly diagnosed cardiomyopathy, and aortic stenosis.  He was initially evaluated by cardiology in 03/2018 following ED visit for chest and left arm pain that had been intermittent for several years.  Echo in 03/2018 showed an EF of greater than [id number], grade 1 diastolic dysfunction, normal RV systolic function and ventricular cavity size, severe calcification of the aortic valve with moderate stenosis with a mean gradient of 23 mmHg, and mild to moderate mitral valve regurgitation.  Treadmill MPI at that time showed no evidence of ischemia with an EF of 55 to 65%, and was overall low risk.  He presented to the hospital in 10/2019 with constipation and was noted to be bradycardic in the ED with rates in the 40s bpm.  He was asymptomatic.  Echo in 10/2019 showed an EF of 60 to 65%, no regional wall motion abnormalities, moderate LVH, grade 1 diastolic dysfunction, normal RV systolic function and ventricular cavity size, mild to moderate mitral valve regurgitation, trivial aortic insufficiency, moderate aortic valve stenosis with a mean gradient of 28.3 mmHg, and negative bubble study.  Subsequent Zio patch in 10/2019 showed a predominant rhythm of sinus with an average rate of 68 bpm with multiple episodes of second-degree AV block, some consistent with Mobitz type I, however several episodes of 2:1 AV block and Mobitz type II were observed.  In  this setting, he was evaluated by Dr. [name] in 12/2019 with deferment of pacer.  Due to persisting symptoms of shortness of breath and fatigue, he underwent R/LHC in 01/2020 that showed mild, nonobstructive CAD with 20 to 30% proximal LAD, mid LAD, and mid LCx stenoses with moderate, approaching severe aortic valve stenosis with a mean gradient of 37 mmHg and a valve area of 1.1 cm, with normal left and right heart filling pressures and normal cardiac output.  Given cath findings, the patient was rereferred to Dr. [name] as there was concern his symptoms were most concerning for arrhythmogenic etiology.  Ultimately, pacemaker was deferred.  The patient subsequently presented to the hospital in late 06/2020 with lethargy, hypoxia, and marked bradycardia.  He was found to be in complete heart block and underwent successful dual-chamber pacemaker implantation by Dr. [name] on 07/12/2020.  He was last seen in the office in 07/2020 and was feeling better, though did note his energy had not improved significantly from his baseline.  He was without symptoms of angina or cardiac decompensation.  He was admitted in 08/2020 with generalized weakness, nausea, and vomiting.  Symptoms were initially suspected to be gastroenteritis.  He was found to have some impairment on finger-nose test with CT of the head showing a subacute left cerebellar infarct with some petechial hemorrhage.  CTA of the head/neck showed left vertebral artery stenosis of 50 to 70%.  Pacemaker interrogation showed no evidence of AT/AF.  Echo showed an EF of 55 to 60%, no regional wall motion abnormalities, mild LVH, grade 1 diastolic  dysfunction, normal RV systolic function and ventricular cavity size, mildly increased RV wall thickness, mildly dilated left atrium, mild to moderate mitral regurgitation with moderate mitral annular calcification, severe calcification of the aortic valve with moderate to severe aortic valve stenosis with a mean gradient  of 38 mmHg and a valve area of 0.74 cm, and trivial aortic insufficiency.  He was seen in the ED in 01/2021 with complaints of weakness and shortness of breath.  Documentation indicates at that time the family did not want aggressive measures and wanted to focus on comfort.  Symptoms were felt to be related to dehydration with mild AKI, possibly exacerbated by underlying aortic stenosis.  He was seen in the ED in 12/2021 with shortness of breath.  High-sensitivity troponin peaked at 28.  BNP 381.  PCT negative.  Chest x-ray with cardiomegaly with vascular congestion without definite pulmonary interstitial edema.  CTA of the chest showed no evidence of PE with mild cardiomegaly without overt failure as well as patchy dependent groundglass in the bilateral lungs, coronary artery calcification, mild degenerative changes in the thoracic spine, and aortic atherosclerosis.  Given the CT findings and elevated BNP, he was given IV Lasix with increased urinary output.  He was most recently seen in the ED on 03/16/2022 with swelling in the bilateral hands shortness of breath.  Family indicated the patient had not been receiving his daily furosemide at his new living facility with approximately 2 weeks.  Order at the facility was for as needed furosemide.  Patient's complaint at that time was diminished hearing of the right ear.  Physical exam showed clear lung sounds with pitting edema in the lower extremities and some edema of the bilateral hands.  Patient's daughter declined labs and chest x-ray indicating she wanted to keep him comfortable and wanted to get started back on Lasix.  She did not want to pursue admission.  He was restarted on 20 mg of Lasix.  In the outpatient setting, he has been followed closely by palliative care with focus on quality and symptom management.  He comes in accompanied by his daughter today who provides updated history.  She indicates the patient has his good days and bad days.   Since resuming furosemide his breathing and edema have improved.  He does continue to have some intermittent edema involving the left hand.  No progressive orthopnea.  His appetite is intermittent.  He does not add salt to his food.  No significant abdominal distention.  No frank syncopal episodes.  He did have an echo performed through [facility] on 03/19/2022 with interpretation provided today indicating "EF of 35% with normal LV size and moderately reduced LV systolic function.  Severe hypokinesis to akinesis noted in the distal anterior, distal septal, and apical walls.  Diastolic dysfunction.  Normal RV systolic function and ventricular cavity size, mildly dilated left atrium, mild to moderate aortic regurgitation, severe aortic stenosis (gradient and valve area not reported), mild mitral annular calcification, mild mitral regurgitation, and trace tricuspid regurgitation."  In the setting of his cardiomyopathy, he was started on lisinopril 5 mg daily.  The patient's weight is stable today by our scale when compared to his last visit in our office in 07/2020.  The patient's daughter indicates her and her brother have had discussions regarding goals of care with their preference to pursue quality and comfort and not pursue further invasive testing.   Labs independently reviewed: 12/2021 - potassium 3.7, BUN 18, serum creatinine 1.38, Hgb 15.2, PLT 151  08/2020 - albumin 3.2, AST/ALT normal, TC 94, TG 92, HDL 29, LDL 47, TSH normal, A1c 6.4  Past Medical History:  Diagnosis Date   Aortic stenosis    Moderate (mean gradient 37, AVA 1.1 by cath in 01/2020)   Borderline diabetes    Complete heart block ([place]) 06/2020   s/p Medtronic PPM 07/12/2020   Coronary artery disease 01/2020   Mild, nonobstructive dz by cath   Hyperlipidemia    Hypertension     Past Surgical History:  Procedure Laterality Date   APPENDECTOMY     HERNIA REPAIR     PACEMAKER IMPLANT N/A 07/12/2020   Procedure: PACEMAKER  IMPLANT;  Surgeon: Vickie Epley, MD;  Location: [facility];  Service: Cardiovascular;  Laterality: N/A;   RIGHT/LEFT HEART CATH AND CORONARY ANGIOGRAPHY N/A 02/03/2020   Procedure: RIGHT/LEFT HEART CATH AND CORONARY ANGIOGRAPHY;  Surgeon: Nelva Bush, MD;  Location: [facility];  Service: Cardiovascular;  Laterality: N/A;   TONSILLECTOMY      Current Medications: Current Meds  Medication Sig   aspirin EC 81 MG tablet Take 81 mg by mouth daily. Swallow whole.   famotidine (PEPCID) 20 MG tablet Take 20 mg by mouth daily.   FLUoxetine (PROZAC) 20 MG capsule Take 20 mg by mouth daily.   furosemide (LASIX) 40 MG tablet Take 40 mg by mouth daily.   lisinopril (ZESTRIL) 2.5 MG tablet Take 1 tablet (2.5 mg total) by mouth daily.   medroxyPROGESTERone (PROVERA) 2.5 MG tablet Take 2.5 mg by mouth daily.   memantine (NAMENDA) 5 MG tablet Take 5 mg by mouth 2 (two) times daily.   mirtazapine (REMERON) 7.5 MG tablet Take 7.5 mg by mouth at bedtime.   pantoprazole (PROTONIX) 40 MG tablet Take 40 mg by mouth daily.   polyethylene glycol (MIRALAX / GLYCOLAX) 17 g packet Take 17 g by mouth daily as needed for mild constipation or moderate constipation.   potassium chloride (MICRO-K) 10 MEQ CR capsule Take 20 mEq by mouth 2 (two) times daily.   risperiDONE (RISPERDAL) 0.25 MG tablet Take 0.25 mg by mouth daily.   risperiDONE (RISPERDAL) 0.5 MG tablet Take 0.5 mg by mouth 2 (two) times daily.   temazepam (RESTORIL) 15 MG capsule Take 1 capsule (15 mg total) by mouth at bedtime as needed for sleep. (Patient taking differently: Take 7.5 mg by mouth at bedtime as needed for sleep.)   vitamin B-12 (CYANOCOBALAMIN) 1000 MCG tablet Take 1,000 mcg by mouth daily.   [DISCONTINUED] furosemide (LASIX) 20 MG tablet Take 1 tablet (20 mg total) by mouth daily.   [DISCONTINUED] lisinopril (ZESTRIL) 5 MG tablet Take 5 mg by mouth daily.   [DISCONTINUED] Multiple Vitamin (DAILY VITE) TABS Take 1  tablet by mouth daily.    Allergies:   Patient has no known allergies.   Social History   Socioeconomic History   Marital status: Widowed    Spouse name: Not on file   Number of children: Not on file   Years of education: Not on file   Highest education level: Not on file  Occupational History   Not on file  Tobacco Use   Smoking status: Never   Smokeless tobacco: Never  Vaping Use   Vaping Use: Never used  Substance and Sexual Activity   Alcohol use: Not Currently    Alcohol/week: 2.0 standard drinks of alcohol    Types: 1 Cans of beer, 1 Shots of liquor per week    Comment: last  drink was Saturday, 04/22/2020.   Drug use: No   Sexual activity: Not Currently  Other Topics Concern   Not on file  Social History Narrative   Not on file   Social Determinants of Health   Financial Resource Strain: Not on file  Food Insecurity: Not on file  Transportation Needs: Not on file  Physical Activity: Not on file  Stress: Not on file  Social Connections: Not on file     Family History:  The patient's family history includes COPD in his sister; Cirrhosis in his father; Congestive Heart Failure in his mother; Heart attack (age of onset: 73) in his mother.  ROS:   Review of Systems  Unable to perform ROS: Dementia     EKGs/Labs/Other Studies Reviewed:    Studies reviewed were summarized above. The additional studies were reviewed today:  2D echo 09/12/2020: 1. Left ventricular ejection fraction, by estimation, is 55 to 60%. The  left ventricle has normal function. The left ventricle has no regional  wall motion abnormalities. There is mild left ventricular hypertrophy.  Left ventricular diastolic parameters  are consistent with Grade I diastolic dysfunction (impaired relaxation).   2. Right ventricular systolic function is normal. The right ventricular  size is normal. Mildly increased right ventricular wall thickness.   3. Left atrial size was mildly dilated.   4. The  mitral valve is degenerative. Mild to moderate mitral valve  regurgitation. No evidence of mitral stenosis. Moderate mitral annular  calcification.   5. The aortic valve has an indeterminant number of cusps. There is severe  calcifcation of the aortic valve. There is severe thickening of the aortic  valve. Aortic valve regurgitation is trivial. Moderate to severe aortic  valve stenosis. Aortic valve  area, by VTI measures 0.74 cm. Aortic valve mean gradient measures 38.0  mmHg.   Comparison(s): A prior study was performed on 11/08/2019. Aortic stenosis  has worsened and is now moderate to severe (mean gradient 31->38 mmHg, AVA  1.4->0.7 cm^2).  __________  [facility] 02/03/2020: Conclusions: Mild, non-obstructive coronary artery disease with 20-30% proximal LAD, mid LAD, and mid LCx stenoses. Moderate, approaching severe, aortic valve stenosis (mean gradient 37 mmHg, valve area 1.1 cm^2). Normal left and right heart filling pressures. Normal cardiac output.   Recommendations: Readdress pacemaker placement with Dr. [name], as episodic shortness of breath and malaise are most concerning for arrhythmic etiology. Continue close clinical and echo follow-up of moderate aortic stenosis. Medical therapy and risk factor modification to prevent progression of coronary artery disease. __________  [facility] Reach patch 10/2019: The patient was monitored for 14 days. The predominant rhythm was sinus with an average rate of 68 bpm (range 56-118 bpm in sinus). There were rare PACs and PVCs. A single 4 beat run of nonsustained ventricular tachycardia occurred. Multiple episodes of second-degree AV block occurred. Some are consistent with Mobitz type I AV block. However, several episodes of 2:1 AV block and Mobitz type II second-degree AV block were observed. There were no patient triggered events.   Predominately sinus rhythm with rare PACs and PVCs as well as single episode of brief NSVT. Multiple episodes of  second-degree AV block, including 2:1 AV block and Mobitz type II second-degree AV block recurred. __________  2D echo with bubble study 11/08/2019: 1. Left ventricular ejection fraction, by estimation, is 60 to 65%. The  left ventricle has normal function. The left ventricle has no regional  wall motion abnormalities. There is moderate left ventricular hypertrophy.  Left ventricular diastolic  parameters are consistent with Grade I diastolic dysfunction (impaired  relaxation).   2. Right ventricular systolic function is normal. The right ventricular  size is normal. Tricuspid regurgitation signal is inadequate for assessing  PA pressure.   3. Left atrial size was mildly dilated.   4. The mitral valve is normal in structure. Mild to moderate mitral valve  regurgitation. No evidence of mitral stenosis. Moderate mitral annular  calcification.   5. The aortic valve is abnormal. Aortic valve regurgitation is trivial.  Moderate aortic valve stenosis. Aortic valve area, by VTI measures 1.36  cm. Aortic valve mean gradient measures 28.3 mmHg.   6. Agitated saline contrast bubble study was negative, with no evidence  of any interatrial shunt.  __________  Treadmill MPI 04/13/2018: No T wave inversion was noted during stress. The study is normal. This is a low risk study. The left ventricular ejection fraction is normal (55-65%). The patient exercised for 4 minutes and 45 seconds and was limited by severe shortness of breath. Frequent PVCs noted in recovery. __________  2D echo 04/03/2018: 1. The cavity size was normal. Normal LV systolic function, EF [id number]   2. There is mild to moderately increased left ventricular wall thickness.  Grade I diastolic dysfunction   3. The right ventricle has normal systolic function. The cavity was  normal. There is no increase in right ventricular wall thickness.   4. Left atrial size was mildly dilated.   5. Severe calcifcation of the aortic valve.  Moderate stenosis of the  aortic valve.AV Mean Grad: 23.0 mmHg   6. Mitral valve regurgitation is mild to moderate by color flow Doppler.   7. Right atrial pressure is estimated at 10 mmHg.    EKG:  EKG is ordered today.  The EKG ordered today demonstrates A sensed V paced, 74 bpm  Recent Labs: 01/04/2022: B Natriuretic Peptide 381.1; BUN 18; Creatinine, Ser 1.38; Hemoglobin 15.2; Platelets 151; Potassium 3.7; Sodium 139  Recent Lipid Panel    Component Value Date/Time   CHOL 94 09/12/2020 0522   TRIG 92 09/12/2020 0522   HDL 29 (L) 09/12/2020 0522   CHOLHDL 3.2 09/12/2020 0522   VLDL 18 09/12/2020 0522   LDLCALC 47 09/12/2020 0522    PHYSICAL EXAM:    VS:  BP 96/64   Pulse 74   Ht 5' 7"$  (1.702 m)   Wt 196 lb 6.4 oz (89.1 kg)   SpO2 96%   BMI 30.76 kg/m   BMI: Body mass index is 30.76 kg/m.  Physical Exam Vitals reviewed.  Constitutional:      Appearance: He is well-developed.     Comments: Elderly appearing  HENT:     Head: Normocephalic and atraumatic.  Eyes:     General:        Right eye: No discharge.        Left eye: No discharge.  Neck:     Vascular: No JVD.  Cardiovascular:     Rate and Rhythm: Normal rate and regular rhythm.     Heart sounds: S1 normal and S2 normal. Heart sounds not distant. No midsystolic click and no opening snap. Murmur heard.     Harsh midsystolic murmur is present with a grade of 3/6 at the upper right sternal border radiating to the neck.     No friction rub.  Pulmonary:     Effort: Pulmonary effort is normal. No respiratory distress.     Breath sounds: Normal breath sounds. No decreased breath sounds, wheezing  or rales.  Chest:     Chest wall: No tenderness.  Abdominal:     General: There is no distension.     Palpations: Abdomen is soft.     Tenderness: There is no abdominal tenderness.  Musculoskeletal:     Cervical back: Normal range of motion.     Comments: Trivial bilateral pretibial edema.  Skin:    General: Skin is  warm and dry.     Nails: There is no clubbing.  Neurological:     Mental Status: He is alert and oriented to person, place, and time.  Psychiatric:        Speech: Speech normal.        Behavior: Behavior normal.        Thought Content: Thought content normal.        Judgment: Judgment normal.     Wt Readings from Last 3 Encounters:  04/02/22 196 lb 6.4 oz (89.1 kg)  01/04/22 203 lb 14.4 oz (92.5 kg)  02/09/21 199 lb 1.2 oz (90.3 kg)     ASSESSMENT & PLAN:   HFrEF: Patient recently underwent echo through [facility] on 03/19/2022 which showed a new cardiomyopathy with an EF of 35% with wall motion abnormality concerning for prior LAD infarct of uncertain timeframe.  Patient's daughter does not want to pursue invasive cardiac catheterization given the patient's progressive vascular dementia.  I agree with the family's decision regarding this, given dementia and in the setting of no voiced or clearly objective symptoms/signs of decompensation.  Given relative hypotension, we will reduce lisinopril to 2.5 mg daily with plans to add back low-dose Toprol-XL as BP allows in follow-up.  Blood pressure would not allow for the addition of ARNI.  Given reported renal dysfunction, and in the context of advanced age, would defer addition of MRA or SGLT2 inhibitor.  He remains on furosemide 40 mg daily.  We have placed recommendations for his living facility to draw a BMP and CBC for our review.  CAD involving the native coronary arteries: Prior cardiac cath showed nonobstructive disease in 2021 with 20 to 30% proximal LAD, mid LAD, and mid LCx stenoses.  Recent echo at outside facility showed a new cardiomyopathy with wall motion abnormality consistent with LAD infarct of uncertain timeframe.  Given the patient's vascular dementia, and in the setting of no voiced symptoms or objective signs of decompensation, the patient has elected to pursue conservative therapy.  He remains on aspirin and lisinopril as  outlined above.  No longer on statin secondary to advanced age and vascular dementia.  No plans for invasive ischemic evaluation.  Moderate to severe aortic stenosis: Echo performed at outside facility indicates severe aortic stenosis (gradients and valve area not reported).  Family does not wish to pursue further invasive testing of his aortic valve disease given his vascular dementia.  Patient would unlikely be a candidate for minimally invasive or invasive repair/replacement.  In this setting, no indication for further echocardiogram at this time.  I did have a frank discussion with the patient's daughter outside the room today indicating concerns regarding progression of cardiac disease including presumed LAD territory infarct, new cardiomyopathy, and progressive now severe aortic stenosis.  I recommend the patient reestablish with palliative care to assist with symptom management in an effort to maintain comfort and preserve quality, as his therapeutic window is becoming more narrow given cardiomyopathy and progressive aortic stenosis.  Overall, guarded to poor prognosis given development of cardiomyopathy with concern for LAD infarct and  progressive aortic stenosis.  These concerns were conveyed to the patient's daughter.  The patient's family appropriately would like to pursue comfort and quality.  Condolences expressed.  Complete heart block: Status post permanent pacemaker implantation.  Followed by EP.    HTN: Blood pressure is on the soft side today.  Given his underlying vascular dementia, he is largely sedentary.  Reduce lisinopril to 2.5 mg daily as outlined above in an effort to possibly initiate low-dose beta-blocker in follow-up given his cardiomyopathy.  HLD: LDL 47.  No longer on atorvastatin  History of CVA: No definite new deficits.  He remains on aspirin.    Disposition: F/u with Dr. Saunders Revel or an APP in 3 months, and EP as directed.   Medication Adjustments/Labs and Tests  Ordered: Current medicines are reviewed at length with the patient today.  Concerns regarding medicines are outlined above. Medication changes, Labs and Tests ordered today are summarized above and listed in the Patient Instructions accessible in Encounters.   Signed, Christell Faith, PA-C 04/02/2022 4:23 PM     [facility] [address] [place] [address] [place], [place] [address] [phone]

## 2022-04-02 NOTE — Patient Instructions (Addendum)
Medication Instructions:  DECREASE lisinopril to 2.5 mg by mouth daily.  *If you need a refill on your cardiac medications before your next appointment, please call your pharmacy*  Lab Work: CBC and CMP to be drawn at [facility]   If you have labs (blood work) drawn today and your tests are completely normal, you will receive your results only by: [place] (if you have MyChart) OR A paper copy in the mail If you have any lab test that is abnormal or we need to change your treatment, we will call you to review the results.   Testing/Procedures: No testing ordered  Follow-Up: At [facility], you and your health needs are our priority.  As part of our continuing mission to provide you with exceptional heart care, we have created designated Provider Care Teams.  These Care Teams include your primary Cardiologist (physician) and Advanced Practice Providers (APPs -  Physician Assistants and Nurse Practitioners) who all work together to provide you with the care you need, when you need it.  We recommend signing up for the patient portal called "MyChart".  Sign up information is provided on this After Visit Summary.  MyChart is used to connect with patients for Virtual Visits (Telemedicine).  Patients are able to view lab/test results, encounter notes, upcoming appointments, etc.  Non-urgent messages can be sent to your provider as well.   To learn more about what you can do with MyChart, go to [email or web address].    Your next appointment:   3 month(s)  Provider:   You may see Nelva Bush, MD or one of the following Advanced Practice Providers on your designated Care Team:   Murray Hodgkins, NP Christell Faith, PA-C Cadence Kathlen Mody, PA-C Gerrie Nordmann, NP

## 2022-04-15 ENCOUNTER — Ambulatory Visit: Payer: Medicare Other

## 2022-04-15 DIAGNOSIS — I441 Atrioventricular block, second degree: Secondary | ICD-10-CM

## 2022-04-16 LAB — CUP PACEART REMOTE DEVICE CHECK
Battery Remaining Longevity: 114 mo
Battery Voltage: 3 V
Brady Statistic AP VP Percent: 4.56 %
Brady Statistic AP VS Percent: 0 %
Brady Statistic AS VP Percent: 94.73 %
Brady Statistic AS VS Percent: 0.71 %
Brady Statistic RA Percent Paced: 4.78 %
Brady Statistic RV Percent Paced: 99.29 %
Date Time Interrogation Session: 20240226193514
Implantable Lead Connection Status: 753985
Implantable Lead Connection Status: 753985
Implantable Lead Implant Date: 20220525
Implantable Lead Implant Date: 20220525
Implantable Lead Location: 753859
Implantable Lead Location: 753860
Implantable Lead Model: 5076
Implantable Lead Model: 5076
Implantable Pulse Generator Implant Date: 20220525
Lead Channel Impedance Value: 342 Ohm
Lead Channel Impedance Value: 399 Ohm
Lead Channel Impedance Value: 399 Ohm
Lead Channel Impedance Value: 456 Ohm
Lead Channel Pacing Threshold Amplitude: 0.625 V
Lead Channel Pacing Threshold Amplitude: 0.625 V
Lead Channel Pacing Threshold Pulse Width: 0.4 ms
Lead Channel Pacing Threshold Pulse Width: 0.4 ms
Lead Channel Sensing Intrinsic Amplitude: 0.75 mV
Lead Channel Sensing Intrinsic Amplitude: 0.75 mV
Lead Channel Sensing Intrinsic Amplitude: 13.625 mV
Lead Channel Sensing Intrinsic Amplitude: 13.625 mV
Lead Channel Setting Pacing Amplitude: 1.5 V
Lead Channel Setting Pacing Amplitude: 2.5 V
Lead Channel Setting Pacing Pulse Width: 0.4 ms
Lead Channel Setting Sensing Sensitivity: 1.2 mV
Zone Setting Status: 755011
Zone Setting Status: 755011

## 2022-05-24 NOTE — Progress Notes (Signed)
Remote pacemaker transmission.   

## 2022-06-25 ENCOUNTER — Emergency Department: Payer: Medicare Other

## 2022-06-25 ENCOUNTER — Emergency Department
Admission: EM | Admit: 2022-06-25 | Discharge: 2022-06-25 | Disposition: A | Discharge status: Home / Self Care | Payer: Medicare Other | Attending: Emergency Medicine | Admitting: Emergency Medicine

## 2022-06-25 DIAGNOSIS — Z515 Encounter for palliative care: Secondary | ICD-10-CM

## 2022-06-25 DIAGNOSIS — I509 Heart failure, unspecified: Secondary | ICD-10-CM | POA: Insufficient documentation

## 2022-06-25 DIAGNOSIS — F0390 Unspecified dementia without behavioral disturbance: Secondary | ICD-10-CM | POA: Insufficient documentation

## 2022-06-25 DIAGNOSIS — R079 Chest pain, unspecified: Secondary | ICD-10-CM | POA: Insufficient documentation

## 2022-06-25 DIAGNOSIS — I110 Hypertensive heart disease with heart failure: Secondary | ICD-10-CM | POA: Insufficient documentation

## 2022-06-25 DIAGNOSIS — Z950 Presence of cardiac pacemaker: Secondary | ICD-10-CM | POA: Diagnosis not present

## 2022-06-25 LAB — CBC WITH DIFFERENTIAL/PLATELET
Abs Immature Granulocytes: 0.01 10*3/uL (ref 0.00–0.07)
Basophils Absolute: 0 10*3/uL (ref 0.0–0.1)
Basophils Relative: 0 %
Eosinophils Absolute: 0.1 10*3/uL (ref 0.0–0.5)
Eosinophils Relative: 1 %
HCT: 42.6 % (ref 39.0–52.0)
Hemoglobin: 14.6 g/dL (ref 13.0–17.0)
Immature Granulocytes: 0 %
Lymphocytes Relative: 30 %
Lymphs Abs: 2.1 10*3/uL (ref 0.7–4.0)
MCH: 30.7 pg (ref 26.0–34.0)
MCHC: 34.3 g/dL (ref 30.0–36.0)
MCV: 89.7 fL (ref 80.0–100.0)
Monocytes Absolute: 0.6 10*3/uL (ref 0.1–1.0)
Monocytes Relative: 8 %
Neutro Abs: 4.4 10*3/uL (ref 1.7–7.7)
Neutrophils Relative %: 61 %
Platelets: 158 10*3/uL (ref 150–400)
RBC: 4.75 MIL/uL (ref 4.22–5.81)
RDW: 13.5 % (ref 11.5–15.5)
WBC: 7.2 10*3/uL (ref 4.0–10.5)
nRBC: 0 % (ref 0.0–0.2)

## 2022-06-25 LAB — BASIC METABOLIC PANEL
Anion gap: 8 (ref 5–15)
BUN: 18 mg/dL (ref 8–23)
CO2: 25 mmol/L (ref 22–32)
Calcium: 8.6 mg/dL — ABNORMAL LOW (ref 8.9–10.3)
Chloride: 104 mmol/L (ref 98–111)
Creatinine, Ser: 1.29 mg/dL — ABNORMAL HIGH (ref 0.61–1.24)
GFR, Estimated: 54 mL/min — ABNORMAL LOW (ref >60)
Glucose, Bld: 126 mg/dL — ABNORMAL HIGH (ref 70–99)
Potassium: 3.7 mmol/L (ref 3.5–5.1)
Sodium: 137 mmol/L (ref 135–145)

## 2022-06-25 LAB — TROPONIN I (HIGH SENSITIVITY): Troponin I (High Sensitivity): 22 ng/L — ABNORMAL HIGH (ref <18)

## 2022-06-25 NOTE — ED Provider Notes (Signed)
[facility] Provider Note    Event Date/Time   First MD Initiated Contact with Patient 06/25/22 [phone]     (approximate)   History   Chief Complaint Chest Pain   HPI  Timothy Ramirez is a 89 y.o. male with past medical history of hypertension, hyperlipidemia, CAD, CHF, complete heart block status post pacemaker, aortic stenosis, and dementia who presents to the ED complaining of chest pain.  History is limited due to patient's dementia and majority of history is obtained from EMS.  EMS reports that patient woke up this morning complaining of pain in his chest to staff at his memory care facility.  When EMS arrived, patient stated that pain had resolved and he was feeling fine.  Patient continues to deny any ongoing pain at the time of arrival to the ED, also denies any difficulty breathing.  Patient denies any recent fevers, cough, nausea, vomiting, or abdominal pain.  He was given 324 mg of aspirin prior to arrival.     Physical Exam   Triage Vital Signs: ED Triage Vitals [06/25/22 0711]  Enc Vitals Group     BP      Pulse      Resp      Temp      Temp src      SpO2 96 %     Weight      Height      Head Circumference      Peak Flow      Pain Score      Pain Loc      Pain Edu?      Excl. in GC?     Most recent vital signs: Vitals:   06/25/22 0711 06/25/22 0720  BP:  117/63  Pulse:  67  Resp:  18  Temp:  97.6 F (36.4 C)  SpO2: 96% 98%    Constitutional: Alert and oriented to person and place, but not time or situation. Eyes: Conjunctivae are normal. Head: Atraumatic. Nose: No congestion/rhinnorhea. Mouth/Throat: Mucous membranes are moist.  Cardiovascular: Normal rate, regular rhythm. Grossly normal heart sounds.  2+ radial pulses bilaterally. Respiratory: Normal respiratory effort.  No retractions. Lungs CTAB.  No chest wall tenderness to palpation. Gastrointestinal: Soft and nontender. No distention. Musculoskeletal: No lower  extremity tenderness nor edema.  Neurologic:  Normal speech and language. No gross focal neurologic deficits are appreciated.    ED Results / Procedures / Treatments   Labs (all labs ordered are listed, but only abnormal results are displayed) Labs Reviewed  TROPONIN I (HIGH SENSITIVITY) - Abnormal; Notable for the following components:      Result Value   Troponin I (High Sensitivity) 22 (*)    All other components within normal limits  CBC WITH DIFFERENTIAL/PLATELET  BASIC METABOLIC PANEL     EKG  ED ECG REPORT I, Chesley Noon, the attending physician, personally viewed and interpreted this ECG.   Date: 06/25/2022  EKG Time: 7:17  Rate: 63  Rhythm: normal sinus rhythm  Axis: Normal  Intervals:left bundle branch block  ST&T Change: None, negative sgarbossa  RADIOLOGY Chest x-ray reviewed and interpreted by me with no infiltrate, edema, or effusion.  PROCEDURES:  Critical Care performed: No  Procedures   MEDICATIONS ORDERED IN ED: Medications - No data to display   IMPRESSION / MDM / ASSESSMENT AND PLAN / ED COURSE  I reviewed the triage vital signs and the nursing notes.  89 y.o. male with past medical history of hypertension, hyperlipidemia, CAD, CHF, complete heart block status post pacemaker, aortic stenosis, and dementia who presents to the ED for chest pain earlier this morning that has since resolved.  Patient's presentation is most consistent with acute presentation with potential threat to life or bodily function.  Differential diagnosis includes, but is not limited to, ACS, PE, dissection, pneumonia, pneumothorax, musculoskeletal pain, GERD, anxiety.  Patient nontoxic-appearing and in no acute distress, vital signs are unremarkable.  Patient currently states that he is chest pain-free and "feels fine."  He has at his baseline mental status with no focal neurologic deficits.  We will screen EKG and 2 sets of troponin,  doubt PE or dissection given resolution of pain.  Chest x-ray is unremarkable, troponin within normal limits, no significant anemia or leukocytosis noted.  Unfortunately, the chemistry machine is currently down at the hospital and results will be delayed.  Patient's daughter is now at bedside and states that he is receiving hospice care, does not desire further workup at this time.  She states that he has been complaining of intermittent chest pain for a a while now, patient continues to deny chest pain currently.  Daughter does not wish for patient to stay in the ED for BMP results and we will discharge him back to his nursing facility.      FINAL CLINICAL IMPRESSION(S) / ED DIAGNOSES   Final diagnoses:  Nonspecific chest pain  Hospice care patient     Rx / DC Orders   ED Discharge Orders     None        Note:  This document was prepared using Dragon voice recognition software and may include unintentional dictation errors.   Chesley Noon, MD 06/25/22 [phone]

## 2022-06-25 NOTE — ED Triage Notes (Signed)
Pt presents to the ED due to CP that started this morning. Pt is from [facility]. Per EMS staff states pt was complaining of CP. Pt is no longer complaining of CP.

## 2022-06-25 NOTE — ED Notes (Signed)
Called [facility] for transport to [facility]

## 2022-07-01 ENCOUNTER — Ambulatory Visit: Payer: Medicare Other | Admitting: Physician Assistant

## 2022-07-16 ENCOUNTER — Ambulatory Visit (INDEPENDENT_AMBULATORY_CARE_PROVIDER_SITE_OTHER)

## 2022-07-16 DIAGNOSIS — I442 Atrioventricular block, complete: Secondary | ICD-10-CM | POA: Diagnosis not present

## 2022-07-17 LAB — CUP PACEART REMOTE DEVICE CHECK
Battery Remaining Longevity: 109 mo
Battery Voltage: 3 V
Brady Statistic AP VP Percent: 14 %
Brady Statistic AP VS Percent: 0.01 %
Brady Statistic AS VP Percent: 84.98 %
Brady Statistic AS VS Percent: 1.02 %
Brady Statistic RA Percent Paced: 14.44 %
Brady Statistic RV Percent Paced: 98.97 %
Date Time Interrogation Session: 20240529094947
Implantable Lead Connection Status: 753985
Implantable Lead Connection Status: 753985
Implantable Lead Implant Date: 20220525
Implantable Lead Implant Date: 20220525
Implantable Lead Location: 753859
Implantable Lead Location: 753860
Implantable Lead Model: 5076
Implantable Lead Model: 5076
Implantable Pulse Generator Implant Date: 20220525
Lead Channel Impedance Value: 323 Ohm
Lead Channel Impedance Value: 361 Ohm
Lead Channel Impedance Value: 380 Ohm
Lead Channel Impedance Value: 437 Ohm
Lead Channel Pacing Threshold Amplitude: 0.5 V
Lead Channel Pacing Threshold Amplitude: 0.5 V
Lead Channel Pacing Threshold Pulse Width: 0.4 ms
Lead Channel Pacing Threshold Pulse Width: 0.4 ms
Lead Channel Sensing Intrinsic Amplitude: 1.375 mV
Lead Channel Sensing Intrinsic Amplitude: 1.375 mV
Lead Channel Sensing Intrinsic Amplitude: 13.625 mV
Lead Channel Sensing Intrinsic Amplitude: 13.625 mV
Lead Channel Setting Pacing Amplitude: 1.5 V
Lead Channel Setting Pacing Amplitude: 2.5 V
Lead Channel Setting Pacing Pulse Width: 0.4 ms
Lead Channel Setting Sensing Sensitivity: 1.2 mV
Zone Setting Status: 755011
Zone Setting Status: 755011

## 2022-08-07 NOTE — Progress Notes (Signed)
Remote pacemaker transmission.   

## 2022-08-28 ENCOUNTER — Other Ambulatory Visit: Payer: Self-pay | Admitting: Physician Assistant

## 2022-08-28 ENCOUNTER — Telehealth: Payer: Self-pay

## 2022-08-28 NOTE — Telephone Encounter (Signed)
Received refill request from [facility] for lisinopril. Called to verify refill was needed because our office was advised by family that patient is in hospice care. Spoke with patient's daughter who confirmed patient is now in hospice under the care of Dr. Karna Christmas so she is not sure why our office received the refill request. She is going to call the pharmacy to correct this. Daughter expressed gratitude for the call.

## 2022-09-12 ENCOUNTER — Encounter: Payer: Self-pay | Admitting: Physician Assistant

## 2022-10-14 ENCOUNTER — Ambulatory Visit (INDEPENDENT_AMBULATORY_CARE_PROVIDER_SITE_OTHER): Payer: PRIVATE HEALTH INSURANCE

## 2022-10-14 DIAGNOSIS — I442 Atrioventricular block, complete: Secondary | ICD-10-CM

## 2022-10-15 LAB — CUP PACEART REMOTE DEVICE CHECK
Battery Remaining Longevity: 105 mo
Battery Voltage: 3 V
Brady Statistic AP VP Percent: 14.03 %
Brady Statistic AP VS Percent: 0.01 %
Brady Statistic AS VP Percent: 85.07 %
Brady Statistic AS VS Percent: 0.89 %
Brady Statistic RA Percent Paced: 14.34 %
Brady Statistic RV Percent Paced: 99.1 %
Date Time Interrogation Session: 20240827092705
Implantable Lead Connection Status: 753985
Implantable Lead Connection Status: 753985
Implantable Lead Implant Date: 20220525
Implantable Lead Implant Date: 20220525
Implantable Lead Location: 753859
Implantable Lead Location: 753860
Implantable Lead Model: 5076
Implantable Lead Model: 5076
Implantable Pulse Generator Implant Date: 20220525
Lead Channel Impedance Value: 323 Ohm
Lead Channel Impedance Value: 361 Ohm
Lead Channel Impedance Value: 361 Ohm
Lead Channel Impedance Value: 418 Ohm
Lead Channel Pacing Threshold Amplitude: 0.5 V
Lead Channel Pacing Threshold Amplitude: 0.5 V
Lead Channel Pacing Threshold Pulse Width: 0.4 ms
Lead Channel Pacing Threshold Pulse Width: 0.4 ms
Lead Channel Sensing Intrinsic Amplitude: 1 mV
Lead Channel Sensing Intrinsic Amplitude: 1 mV
Lead Channel Sensing Intrinsic Amplitude: 18.875 mV
Lead Channel Sensing Intrinsic Amplitude: 18.875 mV
Lead Channel Setting Pacing Amplitude: 1.5 V
Lead Channel Setting Pacing Amplitude: 2.5 V
Lead Channel Setting Pacing Pulse Width: 0.4 ms
Lead Channel Setting Sensing Sensitivity: 1.2 mV
Zone Setting Status: 755011
Zone Setting Status: 755011

## 2022-10-23 NOTE — Progress Notes (Signed)
Remote pacemaker transmission.   

## 2023-01-13 ENCOUNTER — Ambulatory Visit: Payer: Medicare Other

## 2023-01-13 DIAGNOSIS — I442 Atrioventricular block, complete: Secondary | ICD-10-CM

## 2023-01-13 LAB — CUP PACEART REMOTE DEVICE CHECK
Battery Remaining Longevity: 103 mo
Battery Voltage: 2.99 V
Brady Statistic AP VP Percent: 14.74 %
Brady Statistic AP VS Percent: 0.01 %
Brady Statistic AS VP Percent: 84.53 %
Brady Statistic AS VS Percent: 0.72 %
Brady Statistic RA Percent Paced: 14.97 %
Brady Statistic RV Percent Paced: 99.27 %
Date Time Interrogation Session: 20241125091425
Implantable Lead Connection Status: 753985
Implantable Lead Connection Status: 753985
Implantable Lead Implant Date: 20220525
Implantable Lead Implant Date: 20220525
Implantable Lead Location: 753859
Implantable Lead Location: 753860
Implantable Lead Model: 5076
Implantable Lead Model: 5076
Implantable Pulse Generator Implant Date: 20220525
Lead Channel Impedance Value: 323 Ohm
Lead Channel Impedance Value: 380 Ohm
Lead Channel Impedance Value: 380 Ohm
Lead Channel Impedance Value: 437 Ohm
Lead Channel Pacing Threshold Amplitude: 0.5 V
Lead Channel Pacing Threshold Amplitude: 0.5 V
Lead Channel Pacing Threshold Pulse Width: 0.4 ms
Lead Channel Pacing Threshold Pulse Width: 0.4 ms
Lead Channel Sensing Intrinsic Amplitude: 1.25 mV
Lead Channel Sensing Intrinsic Amplitude: 1.25 mV
Lead Channel Sensing Intrinsic Amplitude: 5.75 mV
Lead Channel Sensing Intrinsic Amplitude: 5.75 mV
Lead Channel Setting Pacing Amplitude: 1.5 V
Lead Channel Setting Pacing Amplitude: 2.5 V
Lead Channel Setting Pacing Pulse Width: 0.4 ms
Lead Channel Setting Sensing Sensitivity: 1.2 mV
Zone Setting Status: 755011
Zone Setting Status: 755011

## 2023-02-06 NOTE — Progress Notes (Signed)
Remote pacemaker transmission.   

## 2023-04-14 ENCOUNTER — Ambulatory Visit (INDEPENDENT_AMBULATORY_CARE_PROVIDER_SITE_OTHER): Payer: Medicare Other

## 2023-04-14 DIAGNOSIS — I442 Atrioventricular block, complete: Secondary | ICD-10-CM

## 2023-04-17 LAB — CUP PACEART REMOTE DEVICE CHECK
Battery Remaining Longevity: 99 mo
Battery Voltage: 2.99 V
Brady Statistic AP VP Percent: 9.25 %
Brady Statistic AP VS Percent: 0 %
Brady Statistic AS VP Percent: 90.47 %
Brady Statistic AS VS Percent: 0.28 %
Brady Statistic RA Percent Paced: 9.27 %
Brady Statistic RV Percent Paced: 99.72 %
Date Time Interrogation Session: 20250226131248
Implantable Lead Connection Status: 753985
Implantable Lead Connection Status: 753985
Implantable Lead Implant Date: 20220525
Implantable Lead Implant Date: 20220525
Implantable Lead Location: 753859
Implantable Lead Location: 753860
Implantable Lead Model: 5076
Implantable Lead Model: 5076
Implantable Pulse Generator Implant Date: 20220525
Lead Channel Impedance Value: 323 Ohm
Lead Channel Impedance Value: 361 Ohm
Lead Channel Impedance Value: 380 Ohm
Lead Channel Impedance Value: 418 Ohm
Lead Channel Pacing Threshold Amplitude: 0.5 V
Lead Channel Pacing Threshold Amplitude: 0.5 V
Lead Channel Pacing Threshold Pulse Width: 0.4 ms
Lead Channel Pacing Threshold Pulse Width: 0.4 ms
Lead Channel Sensing Intrinsic Amplitude: 1 mV
Lead Channel Sensing Intrinsic Amplitude: 1 mV
Lead Channel Sensing Intrinsic Amplitude: 5.75 mV
Lead Channel Sensing Intrinsic Amplitude: 5.75 mV
Lead Channel Setting Pacing Amplitude: 1.5 V
Lead Channel Setting Pacing Amplitude: 2.5 V
Lead Channel Setting Pacing Pulse Width: 0.4 ms
Lead Channel Setting Sensing Sensitivity: 1.2 mV
Zone Setting Status: 755011
Zone Setting Status: 755011

## 2023-05-20 NOTE — Addendum Note (Signed)
 Addended by: Geralyn Flash D on: 05/20/2023 12:15 PM   Modules accepted: Orders

## 2023-05-20 NOTE — Progress Notes (Signed)
 Remote pacemaker transmission.

## 2023-05-30 ENCOUNTER — Other Ambulatory Visit: Payer: Self-pay

## 2023-05-30 ENCOUNTER — Emergency Department

## 2023-05-30 ENCOUNTER — Emergency Department
Admission: EM | Admit: 2023-05-30 | Discharge: 2023-05-30 | Disposition: A | Discharge status: Home / Self Care | Attending: Emergency Medicine | Admitting: Emergency Medicine

## 2023-05-30 DIAGNOSIS — I509 Heart failure, unspecified: Secondary | ICD-10-CM | POA: Insufficient documentation

## 2023-05-30 DIAGNOSIS — I2510 Atherosclerotic heart disease of native coronary artery without angina pectoris: Secondary | ICD-10-CM | POA: Diagnosis not present

## 2023-05-30 DIAGNOSIS — R404 Transient alteration of awareness: Secondary | ICD-10-CM

## 2023-05-30 DIAGNOSIS — R509 Fever, unspecified: Secondary | ICD-10-CM | POA: Insufficient documentation

## 2023-05-30 DIAGNOSIS — F0390 Unspecified dementia without behavioral disturbance: Secondary | ICD-10-CM | POA: Diagnosis not present

## 2023-05-30 DIAGNOSIS — R4182 Altered mental status, unspecified: Secondary | ICD-10-CM | POA: Diagnosis present

## 2023-05-30 DIAGNOSIS — I110 Hypertensive heart disease with heart failure: Secondary | ICD-10-CM | POA: Diagnosis not present

## 2023-05-30 DIAGNOSIS — Z950 Presence of cardiac pacemaker: Secondary | ICD-10-CM | POA: Diagnosis not present

## 2023-05-30 DIAGNOSIS — R0602 Shortness of breath: Secondary | ICD-10-CM | POA: Diagnosis not present

## 2023-05-30 LAB — CBC WITH DIFFERENTIAL/PLATELET
Abs Immature Granulocytes: 0.1 10*3/uL — ABNORMAL HIGH (ref 0.00–0.07)
Basophils Absolute: 0 10*3/uL (ref 0.0–0.1)
Basophils Relative: 0 %
Eosinophils Absolute: 0.1 10*3/uL (ref 0.0–0.5)
Eosinophils Relative: 1 %
HCT: 47.1 % (ref 39.0–52.0)
Hemoglobin: 16 g/dL (ref 13.0–17.0)
Immature Granulocytes: 1 %
Lymphocytes Relative: 9 %
Lymphs Abs: 1.6 10*3/uL (ref 0.7–4.0)
MCH: 31.8 pg (ref 26.0–34.0)
MCHC: 34 g/dL (ref 30.0–36.0)
MCV: 93.6 fL (ref 80.0–100.0)
Monocytes Absolute: 1.1 10*3/uL — ABNORMAL HIGH (ref 0.1–1.0)
Monocytes Relative: 6 %
Neutro Abs: 15.5 10*3/uL — ABNORMAL HIGH (ref 1.7–7.7)
Neutrophils Relative %: 83 %
Platelets: 170 10*3/uL (ref 150–400)
RBC: 5.03 MIL/uL (ref 4.22–5.81)
RDW: 13.1 % (ref 11.5–15.5)
WBC: 18.4 10*3/uL — ABNORMAL HIGH (ref 4.0–10.5)
nRBC: 0 % (ref 0.0–0.2)

## 2023-05-30 LAB — URINALYSIS, ROUTINE W REFLEX MICROSCOPIC
Bilirubin Urine: NEGATIVE
Glucose, UA: NEGATIVE mg/dL
Hgb urine dipstick: NEGATIVE
Ketones, ur: NEGATIVE mg/dL
Leukocytes,Ua: NEGATIVE
Nitrite: NEGATIVE
Protein, ur: NEGATIVE mg/dL
Specific Gravity, Urine: 1.024 (ref 1.005–1.030)
pH: 5 (ref 5.0–8.0)

## 2023-05-30 LAB — COMPREHENSIVE METABOLIC PANEL WITH GFR
ALT: 14 U/L (ref 0–44)
AST: 22 U/L (ref 15–41)
Albumin: 3.7 g/dL (ref 3.5–5.0)
Alkaline Phosphatase: 106 U/L (ref 38–126)
Anion gap: 9 (ref 5–15)
BUN: 39 mg/dL — ABNORMAL HIGH (ref 8–23)
CO2: 24 mmol/L (ref 22–32)
Calcium: 8.8 mg/dL — ABNORMAL LOW (ref 8.9–10.3)
Chloride: 104 mmol/L (ref 98–111)
Creatinine, Ser: 1.9 mg/dL — ABNORMAL HIGH (ref 0.61–1.24)
GFR, Estimated: 34 mL/min — ABNORMAL LOW (ref >60)
Glucose, Bld: 187 mg/dL — ABNORMAL HIGH (ref 70–99)
Potassium: 4.8 mmol/L (ref 3.5–5.1)
Sodium: 137 mmol/L (ref 135–145)
Total Bilirubin: 1.5 mg/dL — ABNORMAL HIGH (ref 0.0–1.2)
Total Protein: 6.9 g/dL (ref 6.5–8.1)

## 2023-05-30 LAB — RESP PANEL BY RT-PCR (RSV, FLU A&B, COVID)  RVPGX2
Influenza A by PCR: NEGATIVE
Influenza B by PCR: NEGATIVE
Resp Syncytial Virus by PCR: NEGATIVE
SARS Coronavirus 2 by RT PCR: NEGATIVE

## 2023-05-30 LAB — LACTIC ACID, PLASMA: Lactic Acid, Venous: 2.4 mmol/L (ref 0.5–1.9)

## 2023-05-30 LAB — BRAIN NATRIURETIC PEPTIDE: B Natriuretic Peptide: 337.9 pg/mL — ABNORMAL HIGH (ref 0.0–100.0)

## 2023-05-30 LAB — PROCALCITONIN: Procalcitonin: 0.47 ng/mL

## 2023-05-30 LAB — TROPONIN I (HIGH SENSITIVITY): Troponin I (High Sensitivity): 31 ng/L — ABNORMAL HIGH (ref <18)

## 2023-05-30 NOTE — Discharge Instructions (Signed)
 Patient's workup was overall reassuring does have some worsening kidney function a little bit of fluid on his lungs but after discussion with family and goals of care patient they would like patient to return to the facility and he is acting closer to his baseline self.  They would like to reinitiate hospice after discussion with the [facility] which I think is reasonable given his elevated creatinine, chest x-ray with some fluid noted on it low blood pressures

## 2023-05-30 NOTE — ED Triage Notes (Signed)
 Pt here via [facility] with SOB from [facility]. Pt was found at the nurse's station slumped over slightly hypotensive at 91/48. Pt has a hx of dementia and a CVA. Pt has a pacemaker also. 20L R hand.   100.3 22 RR 20-co2 121-cbg

## 2023-05-30 NOTE — ED Provider Notes (Addendum)
 [facility] Provider Note    Event Date/Time   First MD Initiated Contact with Patient 05/30/23 [phone]     (approximate)   History   No chief complaint on file.   HPI  Timothy Ramirez is a 89 y.o. male with history of complete heart block, hypertension, hyperlipidemia, coronary disease, CHF, pacemaker, dementia who comes in with concerns for altered mental status.  Patient comes in with some shortness of breath.  Patient from [facility].  Patient found slumped over slightly hypotensive 91/48.  Patient was reported to have a fever but rectal temperature here was afebrile without any fever reducers.   Physical Exam   Triage Vital Signs: ED Triage Vitals  Encounter Vitals Group     BP      Systolic BP Percentile      Diastolic BP Percentile      Pulse      Resp      Temp      Temp src      SpO2      Weight      Height      Head Circumference      Peak Flow      Pain Score      Pain Loc      Pain Education      Exclude from Growth Chart     Most recent vital signs: Vitals:   05/30/23 0928 05/30/23 0931  BP:  94/64  Pulse: 92   Resp: 18   Temp:  98.2 F (36.8 C)  SpO2: 100%      General: Awake, no distress.  CV:  Good peripheral perfusion.  Resp:  Normal effort.  Clear lungs Abd:  No distention.  Soft and nontender Other:  Patient is alert and oriented x 2.  Equal grip strength like the left hand has some contracture noted but family reports that is baseline.  Equal strength in legs.  Sensation intact.  Cranial nerves appear reassuring   ED Results / Procedures / Treatments   Labs (all labs ordered are listed, but only abnormal results are displayed) Labs Reviewed  CBC WITH DIFFERENTIAL/PLATELET - Abnormal; Notable for the following components:      Result Value   WBC 18.4 (*)    Neutro Abs 15.5 (*)    Monocytes Absolute 1.1 (*)    Abs Immature Granulocytes 0.10 (*)    All other components within normal limits  COMPREHENSIVE  METABOLIC PANEL WITH GFR - Abnormal; Notable for the following components:   Glucose, Bld 187 (*)    BUN 39 (*)    Creatinine, Ser 1.90 (*)    Calcium 8.8 (*)    Total Bilirubin 1.5 (*)    GFR, Estimated 34 (*)    All other components within normal limits  URINALYSIS, ROUTINE W REFLEX MICROSCOPIC - Abnormal; Notable for the following components:   Color, Urine AMBER (*)    APPearance HAZY (*)    All other components within normal limits  TROPONIN I (HIGH SENSITIVITY) - Abnormal; Notable for the following components:   Troponin I (High Sensitivity) 31 (*)    All other components within normal limits  CULTURE, BLOOD (ROUTINE X 2)  CULTURE, BLOOD (ROUTINE X 2)  RESP PANEL BY RT-PCR (RSV, FLU A&B, COVID)  RVPGX2  BLOOD GAS, VENOUS  LACTIC ACID, PLASMA  BRAIN NATRIURETIC PEPTIDE  PROCALCITONIN  LACTIC ACID, PLASMA     EKG  My interpretation of EKG:  Sinus rate  of 78 without any ST elevation or T wave inversions for aVL, left bundle branch block  RADIOLOGY I have reviewed the xray personally and interpreted patient has stable cardiomegaly with some pulmonary vascular congestion   PROCEDURES:  Critical Care performed: No  .1-3 Lead EKG Interpretation  Performed by: Concha Se, MD Authorized by: Concha Se, MD     Interpretation: normal     ECG rate:  90   ECG rate assessment: normal     Rhythm: sinus rhythm     Ectopy: none     Conduction: normal      MEDICATIONS ORDERED IN ED: Medications - No data to display   IMPRESSION / MDM / ASSESSMENT AND PLAN / ED COURSE  I reviewed the triage vital signs and the nursing notes.   Patient's presentation is most consistent with acute presentation with potential threat to life or bodily function.   Patient comes in with episode of unresponsiveness.  Workup was done evaluate for Electra abnormalities, AKI.Marland Kitchen  Discussed with patient's son who is at bedside who is the POA in conjunction with his sister.  They stated that  patient was previously on hospice but he had to come off hospice due to appearing better.  They report that they did not want patient transported to the emergency room that his goals of care are comfort that they would not want any invasive procedures.  We discussed CT imaging to evaluate for intracranial hemorrhage, stroke but family of opted to decline.  I do not see any obvious evidence of stroke at this time and patient appears to be acting closer to his baseline self.  We discussed interrogation of pacemaker but to be unlikely to change management so again they have declined this as well  UA without evidence of UTI.  CBC shows elevated white count.  CMP shows elevated creatinine up to 1.9 troponin is slightly elevated but stable from prior VBG no hypercapnia  Discussed with patient family about goals of care.  Patient would not have wanted to have additional interventions they would not want antibiotics, fluids we discussed admission for monitoring, IV diuresis with potential midodrine to help with elevated blood pressures but given patient's dementia I discussed with the POA's both the brother and the sister who are agreeable that their goal is comfort and given this they would like patient to return back to the facility and reinitiate hospice.   Given patient's worsening mental status and difficulties with ambulation I did recommend transfer back to facility with ambulance.  I will also reach out to our hospice here to see if they can help arrange outpatient follow-up given patient's on the floor care palliative care.  They were okay with discharging prior to procalcitonin, lactate levels resulting given it was not going to change management given their goals of care are comfort at this time.  The patient is on the cardiac monitor to evaluate for evidence of arrhythmia and/or significant heart rate changes.      FINAL CLINICAL IMPRESSION(S) / ED DIAGNOSES   Final diagnoses:  Unresponsive  episode     Rx / DC Orders   ED Discharge Orders     None        Note:  This document was prepared using Dragon voice recognition software and may include unintentional dictation errors.   Concha Se, MD 05/30/23 1103    Concha Se, MD 05/30/23 [phone]

## 2023-05-30 NOTE — ED Notes (Signed)
 MD [facility] notified of lactic of 2.4

## 2023-05-30 NOTE — Progress Notes (Signed)
 Sports coach Note  [name] is a current palliative care patient followed at [facility].  Received a call from Dr. Alfred Levins, ED MD- that the patient's family is wanting to transition from palliative care at [facility] to hospice care.  [facility] palliative team feels this is appropriate.  New referral for hospice services sent to our current palliative team and referral intake.  Thank you, Norris Cross, RN Nurse Liaison [phone]

## 2023-05-30 NOTE — ED Notes (Signed)
 [facility]  CALLED FOR  TRANSPORT TO  [facility]

## 2023-06-04 LAB — CULTURE, BLOOD (ROUTINE X 2)
Culture: NO GROWTH
Culture: NO GROWTH
Special Requests: ADEQUATE

## 2023-06-07 LAB — BLOOD GAS, VENOUS
Acid-Base Excess: 0.3 mmol/L (ref 0.0–2.0)
Bicarbonate: 26 mmol/L (ref 20.0–28.0)
O2 Saturation: 34.8 %
Patient temperature: 37
pCO2, Ven: 45 mmHg (ref 44–60)
pH, Ven: 7.37 (ref 7.25–7.43)

## 2023-07-15 ENCOUNTER — Ambulatory Visit (INDEPENDENT_AMBULATORY_CARE_PROVIDER_SITE_OTHER): Payer: Medicare Other

## 2023-07-15 DIAGNOSIS — I442 Atrioventricular block, complete: Secondary | ICD-10-CM

## 2023-07-16 LAB — CUP PACEART REMOTE DEVICE CHECK
Battery Remaining Longevity: 96 mo
Battery Voltage: 2.99 V
Brady Statistic AP VP Percent: 13.12 %
Brady Statistic AP VS Percent: 0 %
Brady Statistic AS VP Percent: 86.14 %
Brady Statistic AS VS Percent: 0.74 %
Brady Statistic RA Percent Paced: 13.21 %
Brady Statistic RV Percent Paced: 99.26 %
Date Time Interrogation Session: 20250527091542
Implantable Lead Connection Status: 753985
Implantable Lead Connection Status: 753985
Implantable Lead Implant Date: 20220525
Implantable Lead Implant Date: 20220525
Implantable Lead Location: 753859
Implantable Lead Location: 753860
Implantable Lead Model: 5076
Implantable Lead Model: 5076
Implantable Pulse Generator Implant Date: 20220525
Lead Channel Impedance Value: 304 Ohm
Lead Channel Impedance Value: 342 Ohm
Lead Channel Impedance Value: 380 Ohm
Lead Channel Impedance Value: 418 Ohm
Lead Channel Pacing Threshold Amplitude: 0.5 V
Lead Channel Pacing Threshold Amplitude: 0.625 V
Lead Channel Pacing Threshold Pulse Width: 0.4 ms
Lead Channel Pacing Threshold Pulse Width: 0.4 ms
Lead Channel Sensing Intrinsic Amplitude: 1.125 mV
Lead Channel Sensing Intrinsic Amplitude: 1.125 mV
Lead Channel Sensing Intrinsic Amplitude: 15.375 mV
Lead Channel Sensing Intrinsic Amplitude: 15.375 mV
Lead Channel Setting Pacing Amplitude: 1.5 V
Lead Channel Setting Pacing Amplitude: 2.5 V
Lead Channel Setting Pacing Pulse Width: 0.4 ms
Lead Channel Setting Sensing Sensitivity: 1.2 mV
Zone Setting Status: 755011
Zone Setting Status: 755011

## 2023-08-19 DEATH — deceased

## 2023-09-01 NOTE — Progress Notes (Signed)
 Remote pacemaker transmission.

## 2023-09-01 NOTE — Addendum Note (Signed)
 Addended by: TAWNI DRILLING D on: 09/01/2023 10:36 AM   Modules accepted: Orders, Level of Service

## 2023-10-14 ENCOUNTER — Ambulatory Visit: Payer: Medicare Other

## 2024-01-13 ENCOUNTER — Ambulatory Visit: Payer: Medicare Other

## 2024-04-13 ENCOUNTER — Ambulatory Visit: Payer: Medicare Other

## 2024-07-13 ENCOUNTER — Ambulatory Visit: Payer: Medicare Other
